# Patient Record
Sex: Male | Born: 1960 | Race: White | Hispanic: No | Marital: Married | Smoking: Former smoker
Health system: Southern US, Community
[De-identification: ages and names within clinical notes are randomized; demographics above are authoritative.]

## PROBLEM LIST (undated history)

## (undated) DIAGNOSIS — N289 Disorder of kidney and ureter, unspecified: Secondary | ICD-10-CM

## (undated) DIAGNOSIS — I4891 Unspecified atrial fibrillation: Secondary | ICD-10-CM

## (undated) DIAGNOSIS — L511 Stevens-Johnson syndrome: Secondary | ICD-10-CM

## (undated) DIAGNOSIS — F419 Anxiety disorder, unspecified: Secondary | ICD-10-CM

## (undated) DIAGNOSIS — I499 Cardiac arrhythmia, unspecified: Secondary | ICD-10-CM

## (undated) HISTORY — PX: OTHER SURGICAL HISTORY: SHX169

---

## 2001-09-11 ENCOUNTER — Encounter: Payer: Self-pay | Admitting: Emergency Medicine

## 2001-09-11 ENCOUNTER — Emergency Department (HOSPITAL_COMMUNITY): Admission: EM | Admit: 2001-09-11 | Discharge: 2001-09-11 | Payer: Self-pay | Admitting: Emergency Medicine

## 2002-06-21 ENCOUNTER — Encounter: Payer: Self-pay | Admitting: Emergency Medicine

## 2002-06-22 ENCOUNTER — Observation Stay (HOSPITAL_COMMUNITY): Admission: EM | Admit: 2002-06-22 | Discharge: 2002-06-22 | Payer: Self-pay | Admitting: Emergency Medicine

## 2002-06-24 ENCOUNTER — Ambulatory Visit (HOSPITAL_BASED_OUTPATIENT_CLINIC_OR_DEPARTMENT_OTHER): Admission: RE | Admit: 2002-06-24 | Discharge: 2002-06-24 | Payer: Self-pay | Admitting: Urology

## 2003-08-21 DIAGNOSIS — L511 Stevens-Johnson syndrome: Secondary | ICD-10-CM

## 2003-08-21 HISTORY — DX: Stevens-Johnson syndrome: L51.1

## 2004-01-02 ENCOUNTER — Emergency Department (HOSPITAL_COMMUNITY): Admission: EM | Admit: 2004-01-02 | Discharge: 2004-01-02 | Payer: Self-pay | Admitting: Emergency Medicine

## 2004-01-06 ENCOUNTER — Emergency Department (HOSPITAL_COMMUNITY): Admission: EM | Admit: 2004-01-06 | Discharge: 2004-01-06 | Payer: Self-pay | Admitting: Emergency Medicine

## 2004-01-09 ENCOUNTER — Emergency Department (HOSPITAL_COMMUNITY): Admission: EM | Admit: 2004-01-09 | Discharge: 2004-01-09 | Payer: Self-pay | Admitting: Emergency Medicine

## 2004-01-15 ENCOUNTER — Emergency Department (HOSPITAL_COMMUNITY): Admission: EM | Admit: 2004-01-15 | Discharge: 2004-01-15 | Payer: Self-pay

## 2004-01-22 ENCOUNTER — Inpatient Hospital Stay (HOSPITAL_COMMUNITY): Admission: EM | Admit: 2004-01-22 | Discharge: 2004-01-28 | Payer: Self-pay | Admitting: Emergency Medicine

## 2004-02-11 ENCOUNTER — Ambulatory Visit (HOSPITAL_COMMUNITY): Admission: RE | Admit: 2004-02-11 | Discharge: 2004-02-11 | Payer: Self-pay | Admitting: Dermatology

## 2004-03-24 ENCOUNTER — Encounter (INDEPENDENT_AMBULATORY_CARE_PROVIDER_SITE_OTHER): Payer: Self-pay | Admitting: Specialist

## 2004-03-24 ENCOUNTER — Ambulatory Visit (HOSPITAL_COMMUNITY): Admission: RE | Admit: 2004-03-24 | Discharge: 2004-03-24 | Payer: Self-pay | Admitting: Urology

## 2004-03-24 ENCOUNTER — Ambulatory Visit (HOSPITAL_BASED_OUTPATIENT_CLINIC_OR_DEPARTMENT_OTHER): Admission: RE | Admit: 2004-03-24 | Discharge: 2004-03-24 | Payer: Self-pay | Admitting: Urology

## 2004-04-12 ENCOUNTER — Observation Stay (HOSPITAL_COMMUNITY): Admission: EM | Admit: 2004-04-12 | Discharge: 2004-04-13 | Payer: Self-pay | Admitting: Emergency Medicine

## 2004-04-17 ENCOUNTER — Emergency Department (HOSPITAL_COMMUNITY): Admission: EM | Admit: 2004-04-17 | Discharge: 2004-04-17 | Payer: Self-pay | Admitting: Emergency Medicine

## 2004-06-17 ENCOUNTER — Emergency Department (HOSPITAL_COMMUNITY): Admission: EM | Admit: 2004-06-17 | Discharge: 2004-06-18 | Payer: Self-pay | Admitting: Emergency Medicine

## 2004-06-20 ENCOUNTER — Emergency Department (HOSPITAL_COMMUNITY): Admission: EM | Admit: 2004-06-20 | Discharge: 2004-06-20 | Payer: Self-pay | Admitting: Emergency Medicine

## 2004-07-03 ENCOUNTER — Emergency Department (HOSPITAL_COMMUNITY): Admission: EM | Admit: 2004-07-03 | Discharge: 2004-07-03 | Payer: Self-pay | Admitting: Emergency Medicine

## 2004-08-22 ENCOUNTER — Encounter: Admission: RE | Admit: 2004-08-22 | Discharge: 2004-08-22 | Payer: Self-pay | Admitting: Internal Medicine

## 2004-10-25 ENCOUNTER — Ambulatory Visit: Payer: Self-pay | Admitting: Psychiatry

## 2004-10-25 ENCOUNTER — Inpatient Hospital Stay (HOSPITAL_COMMUNITY): Admission: RE | Admit: 2004-10-25 | Discharge: 2004-11-06 | Payer: Self-pay | Admitting: Psychiatry

## 2004-10-25 ENCOUNTER — Emergency Department (HOSPITAL_COMMUNITY): Admission: EM | Admit: 2004-10-25 | Discharge: 2004-10-25 | Payer: Self-pay | Admitting: Emergency Medicine

## 2004-11-05 ENCOUNTER — Emergency Department (HOSPITAL_COMMUNITY): Admission: EM | Admit: 2004-11-05 | Discharge: 2004-11-05 | Payer: Self-pay | Admitting: Emergency Medicine

## 2004-11-07 ENCOUNTER — Encounter: Admission: RE | Admit: 2004-11-07 | Discharge: 2004-11-07 | Payer: Self-pay | Admitting: Internal Medicine

## 2004-11-09 ENCOUNTER — Ambulatory Visit (HOSPITAL_COMMUNITY): Admission: RE | Admit: 2004-11-09 | Discharge: 2004-11-09 | Payer: Self-pay | Admitting: Internal Medicine

## 2004-11-14 ENCOUNTER — Inpatient Hospital Stay (HOSPITAL_COMMUNITY): Admission: RE | Admit: 2004-11-14 | Discharge: 2004-11-17 | Payer: Self-pay | Admitting: Psychiatry

## 2005-01-19 ENCOUNTER — Ambulatory Visit (HOSPITAL_COMMUNITY): Admission: RE | Admit: 2005-01-19 | Discharge: 2005-01-19 | Payer: Self-pay | Admitting: Orthopedic Surgery

## 2005-01-19 ENCOUNTER — Ambulatory Visit (HOSPITAL_BASED_OUTPATIENT_CLINIC_OR_DEPARTMENT_OTHER): Admission: RE | Admit: 2005-01-19 | Discharge: 2005-01-19 | Payer: Self-pay | Admitting: Orthopedic Surgery

## 2005-01-25 ENCOUNTER — Ambulatory Visit (HOSPITAL_COMMUNITY): Admission: RE | Admit: 2005-01-25 | Discharge: 2005-01-25 | Payer: Self-pay | Admitting: Orthopedic Surgery

## 2005-07-23 IMAGING — CT CT HEAD W/O CM
1 series · 16 of 28 positions shown, 20 images · non-contrast
Comparison: None.

CLINICAL DATA: Stevens-Johnson syndrome.  Rash.  Blurred vision in the right eye.  

 HEAD CT WITHOUT CONTRAST, 01/25/04

[Series 2: head routi 5.0 h30s · axial · 0.43mm/px · z∈[+1044,+1169]mm · 16 of 28 slices shown, 20 images]
[im 2/28  brain]
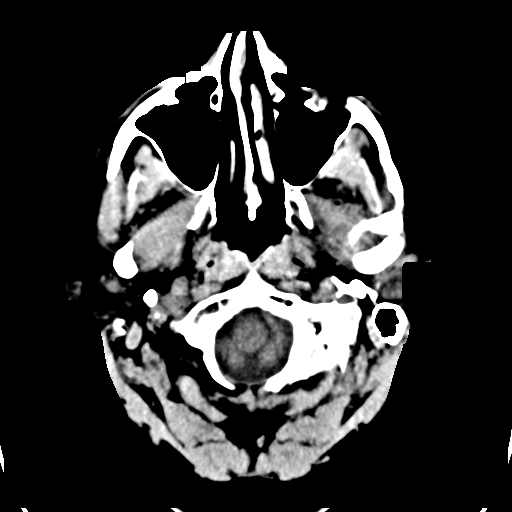
[im 2/28  bone]
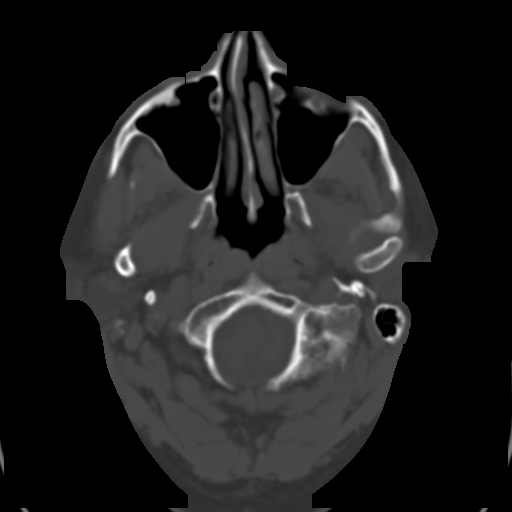
[im 4/28  brain]
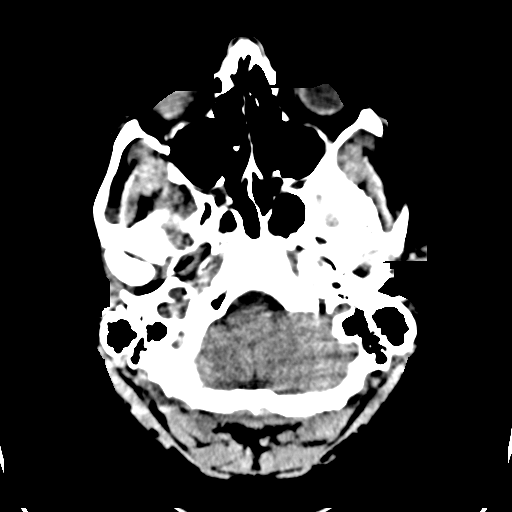
[im 6/28  brain]
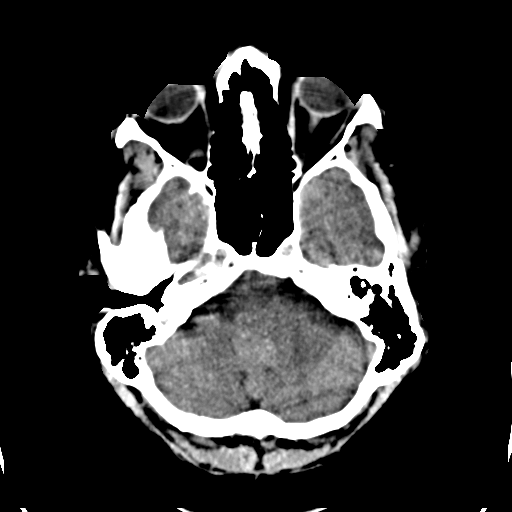
[im 7/28  brain]
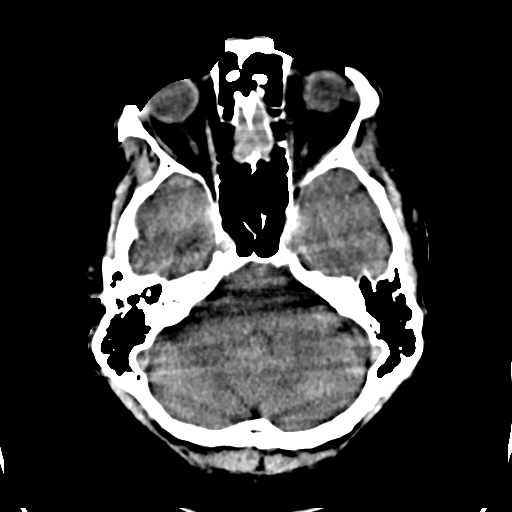
[im 9/28  brain]
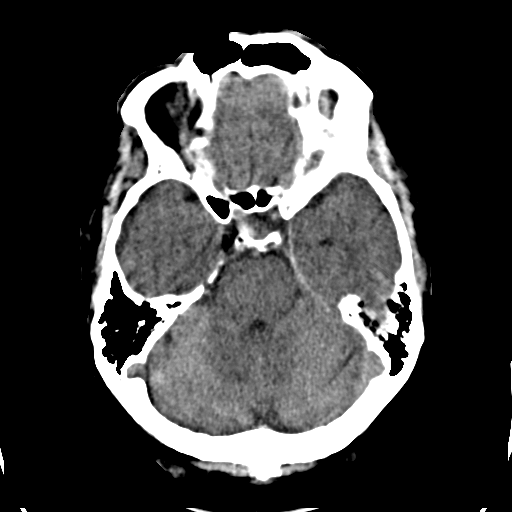
[im 9/28  bone]
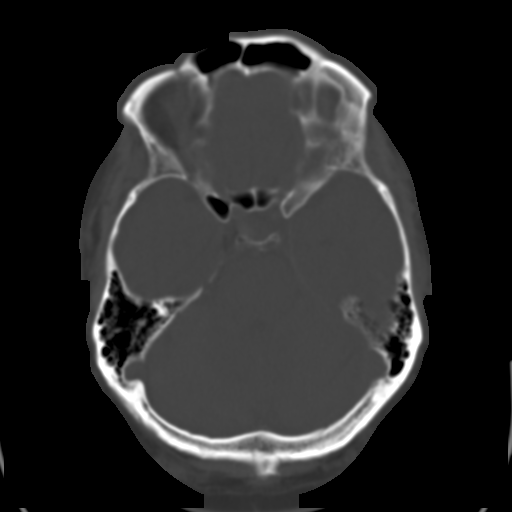
[im 10/28  brain]
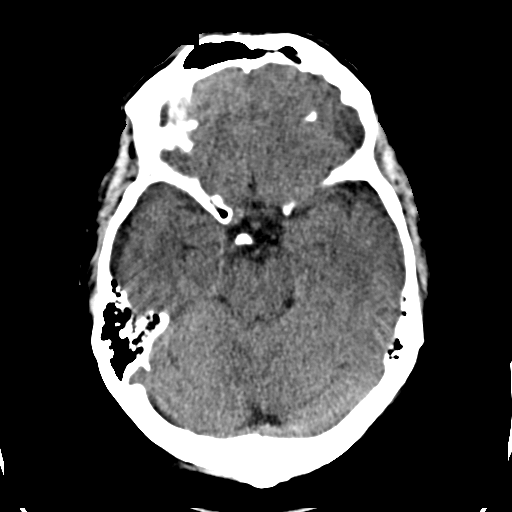
[im 12/28  brain]
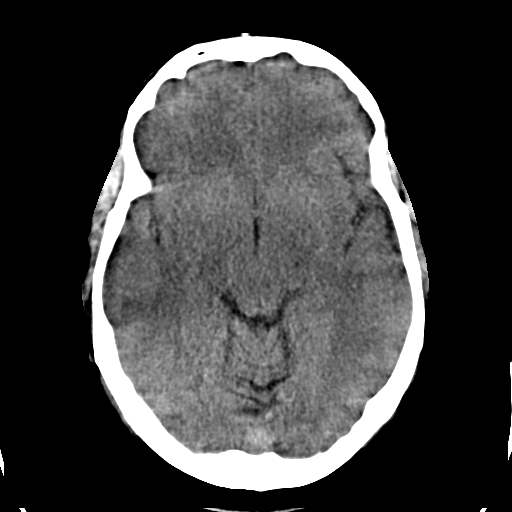
[im 14/28  brain]
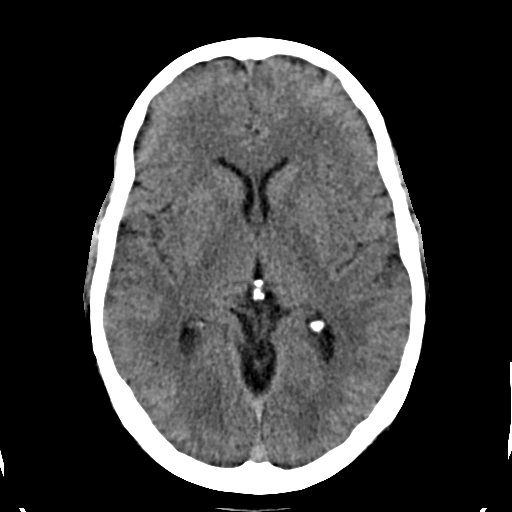
[im 15/28  brain]
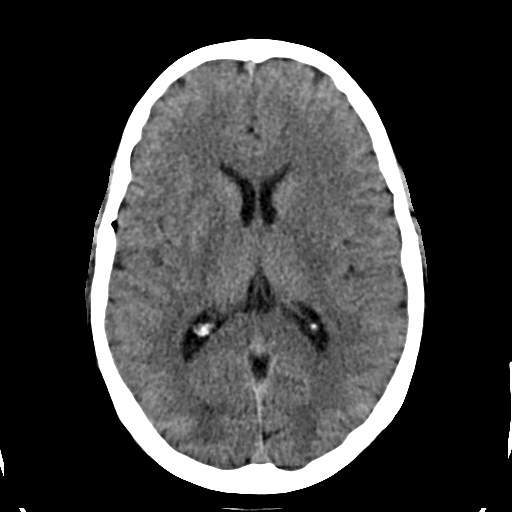
[im 15/28  bone]
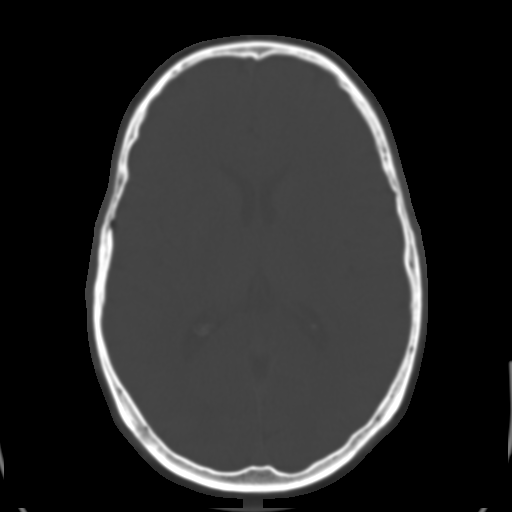
[im 17/28  brain]
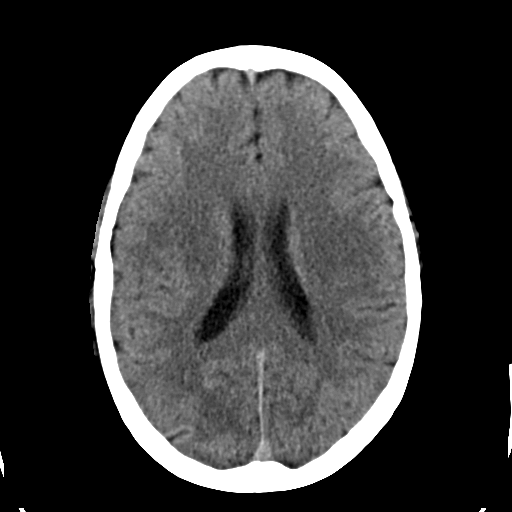
[im 19/28  brain]
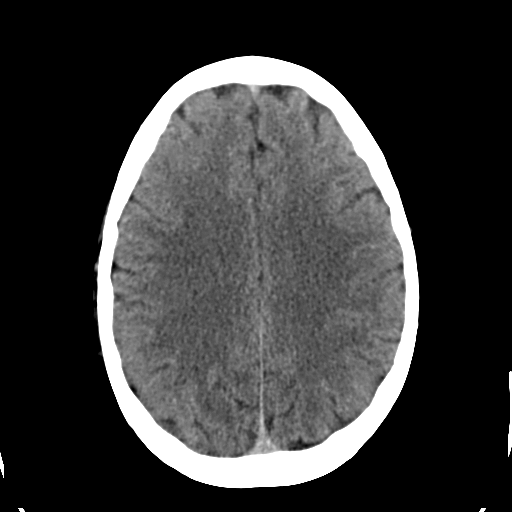
[im 20/28  brain]
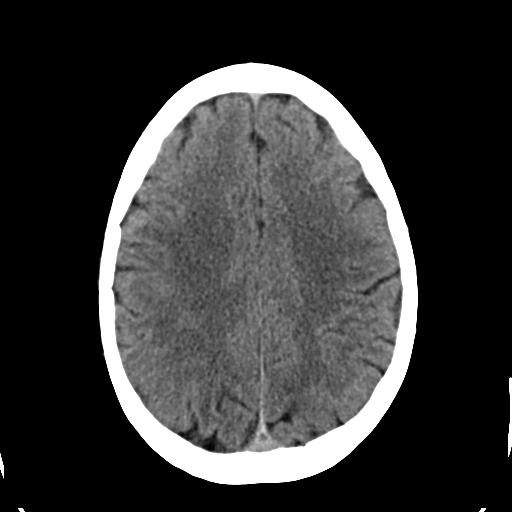
[im 22/28  brain]
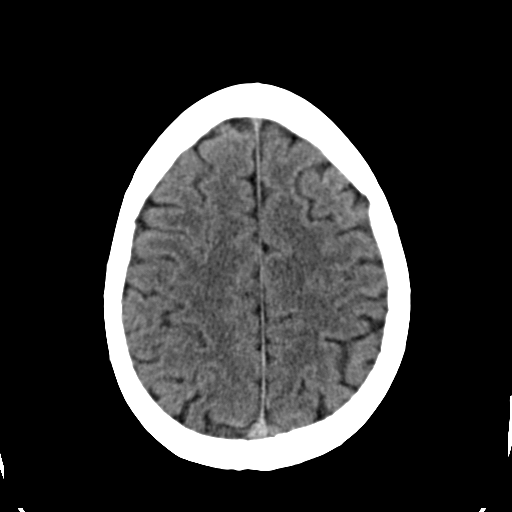
[im 22/28  bone]
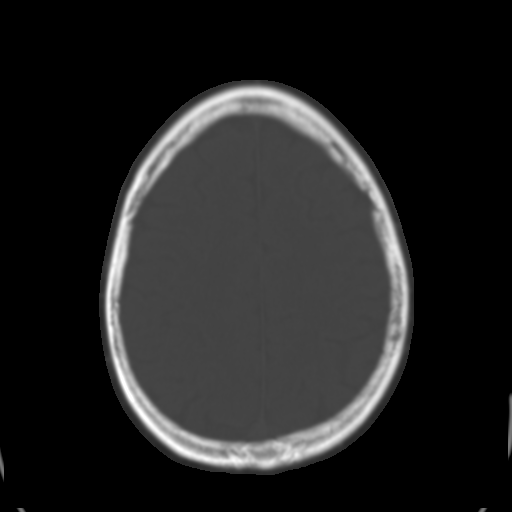
[im 23/28  brain]
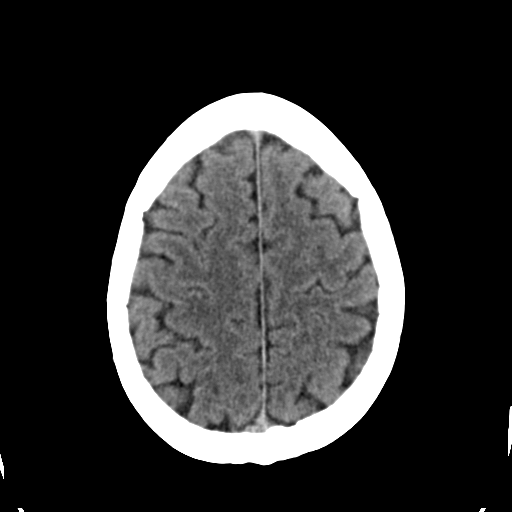
[im 25/28  brain]
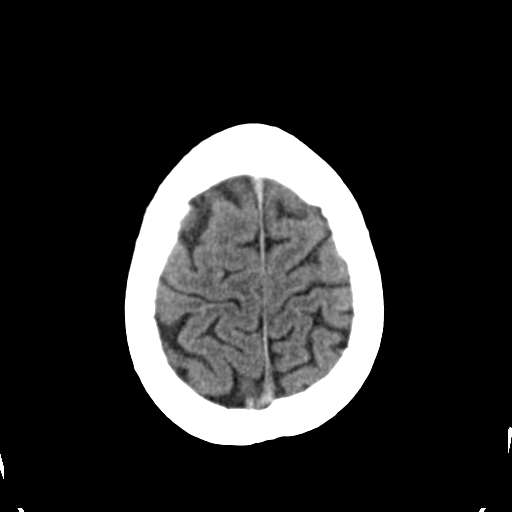
[im 27/28  brain]
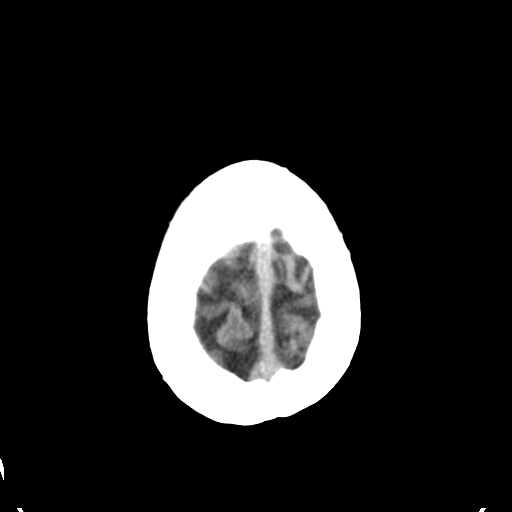

[16 of 28 positions shown; findings below may reference images not displayed]

FINDINGS: There is no evidence for acute hemorrhage, hydrocephalus, mass effect, or abnormal extra-axial fluid collection.  No CT evidence for acute ischemia is identified.  Globes are spherical and symmetric.  The intraorbital fat is preserved bilaterally.  

 Bone windows are unremarkable.  Visualized portions of the paranasal sinuses are clear. 

 IMPRESSION
 No acute intracranial abnormality. 

 [REDACTED]

## 2005-12-19 ENCOUNTER — Ambulatory Visit: Payer: Self-pay | Admitting: Internal Medicine

## 2005-12-20 ENCOUNTER — Ambulatory Visit: Payer: Self-pay | Admitting: Internal Medicine

## 2005-12-30 IMAGING — CT CT ABDOMEN W/O CM
1 series · 15 of 32 positions shown, 19 images · non-contrast
Comparison: 06/18/04 and 03/27/04.

CLINICAL DATA: Abdominal and pelvic pain, right flank pain.  History of   renal calculi.
TECHNIQUE: Multidetector helical CT scanning obtained from the upper abdomen through the remainder of the abdomen and pelvis.

[Series 3: — · axial · 0.78mm/px · z∈[-590,-234]mm · 15 of 100 slices shown, 19 images]
[im 7/100  soft-tissue]
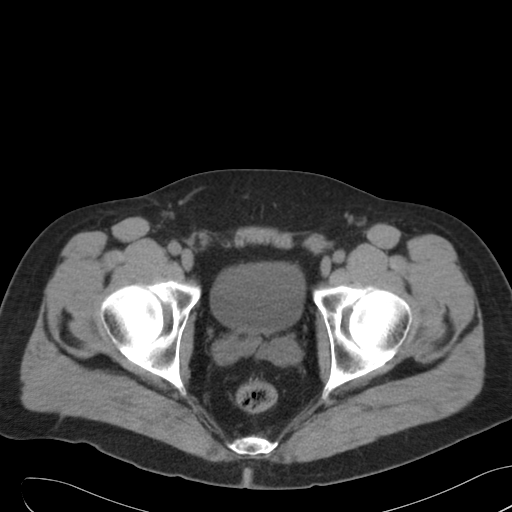
[im 7/100  bone]
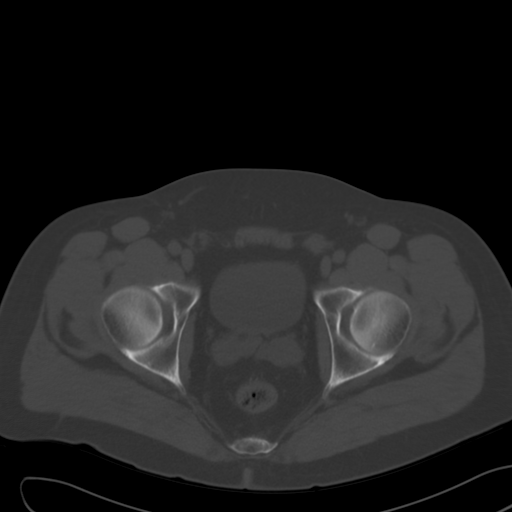
[im 13/100  soft-tissue]
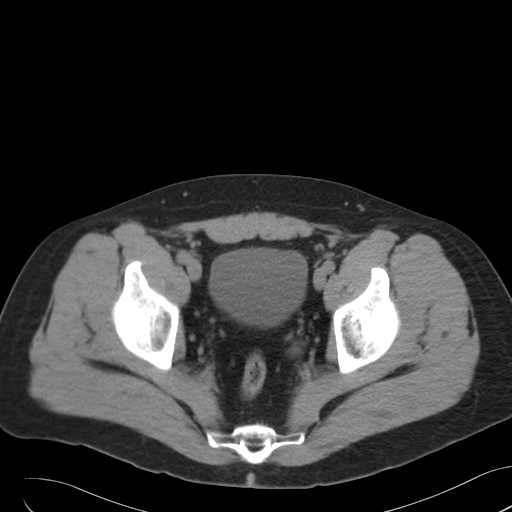
[im 20/100  soft-tissue]
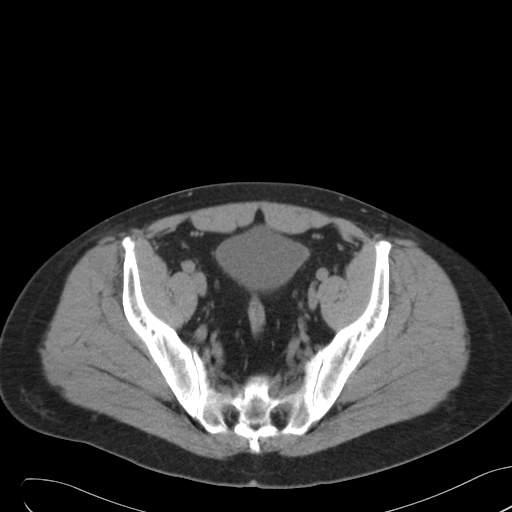
[im 29/100  soft-tissue]
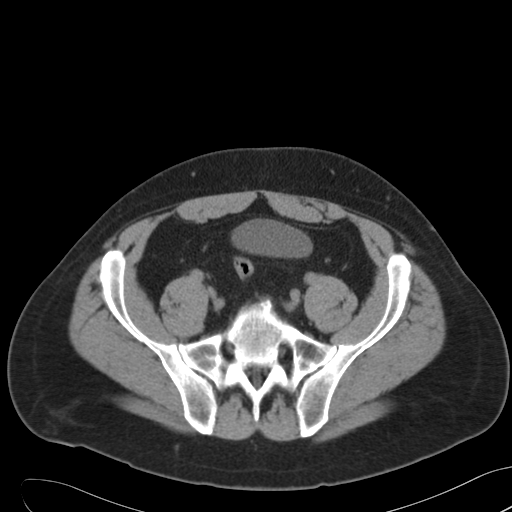
[im 36/100  soft-tissue]
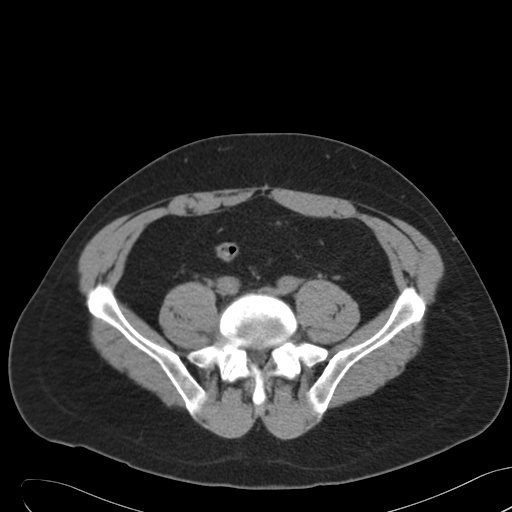
[im 42/100  soft-tissue]
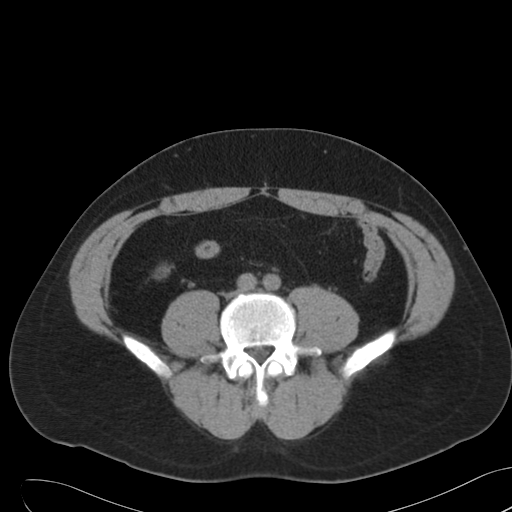
[im 52/100  soft-tissue]
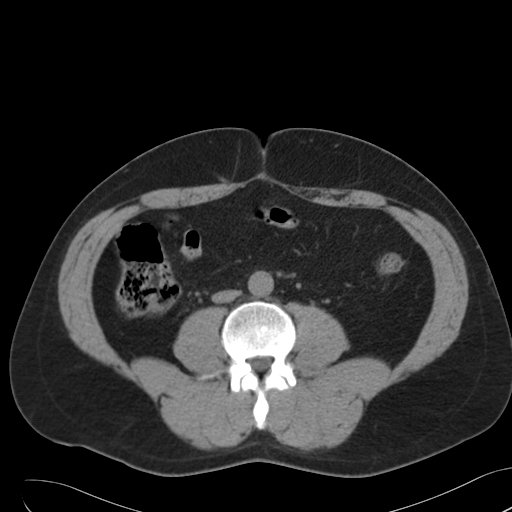
[im 58/100  soft-tissue]
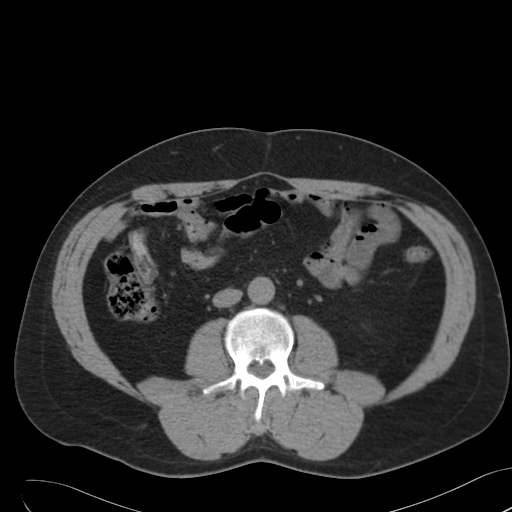
[im 64/100  soft-tissue]
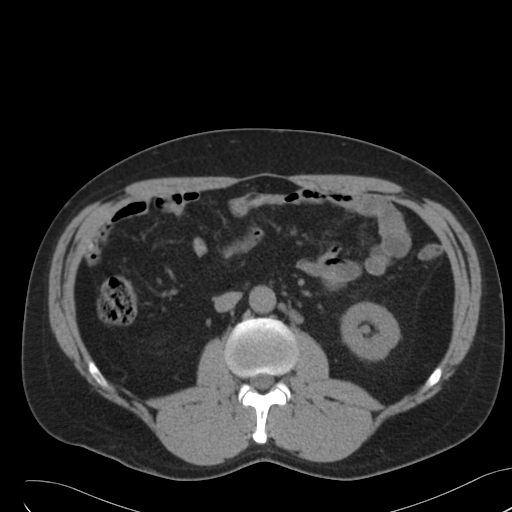
[im 64/100  bone]
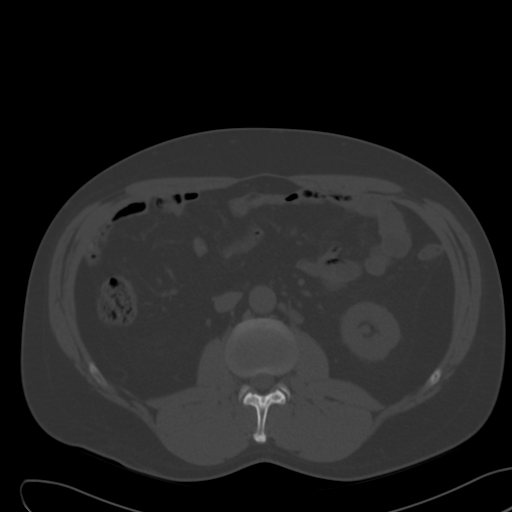
[im 71/100  soft-tissue]
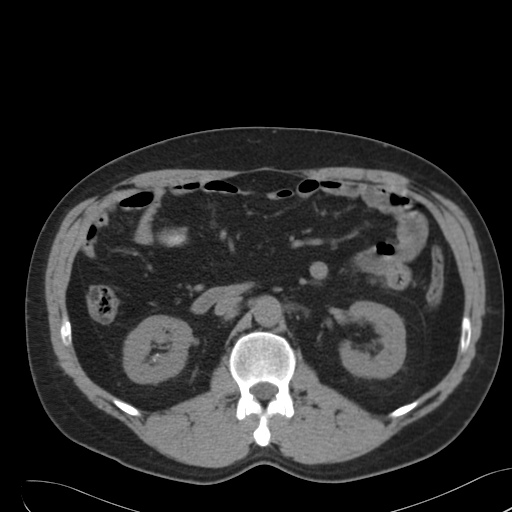
[im 80/100  soft-tissue]
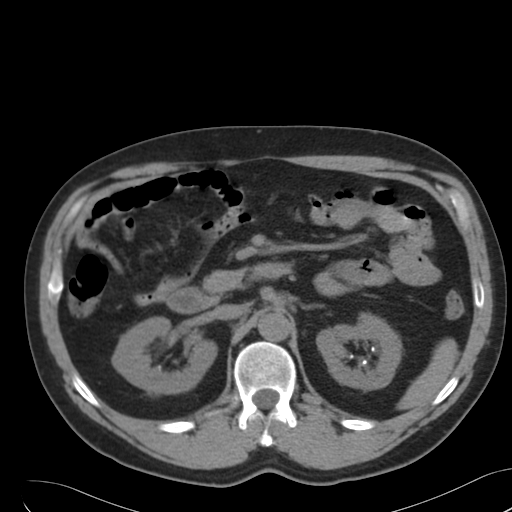
[im 87/100  soft-tissue]
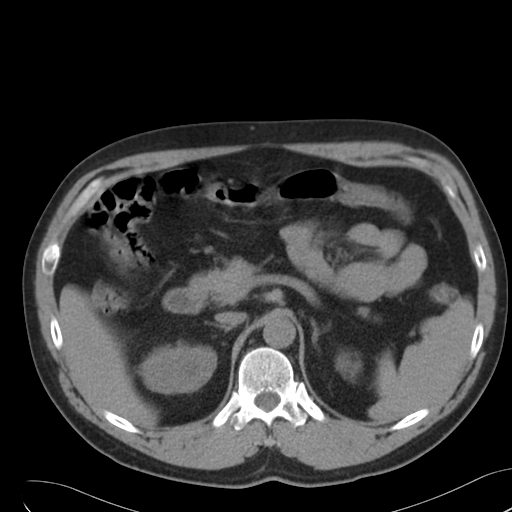
[im 87/100  lung]
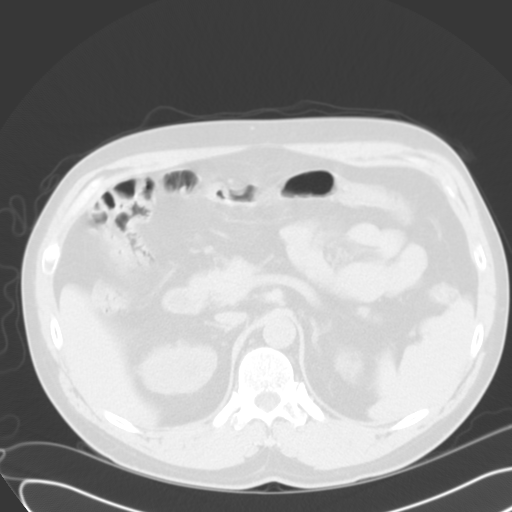
[im 90/100  lung]
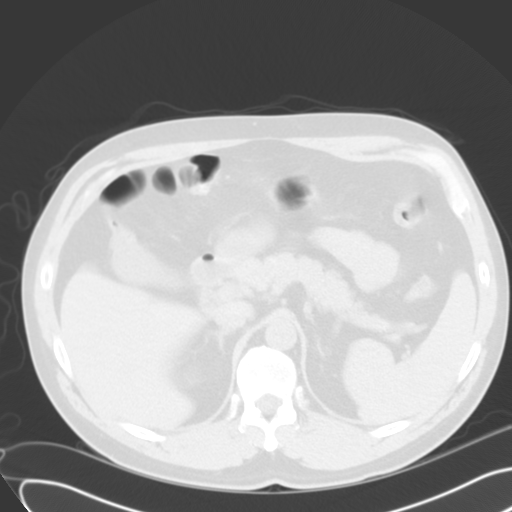
[im 93/100  soft-tissue]
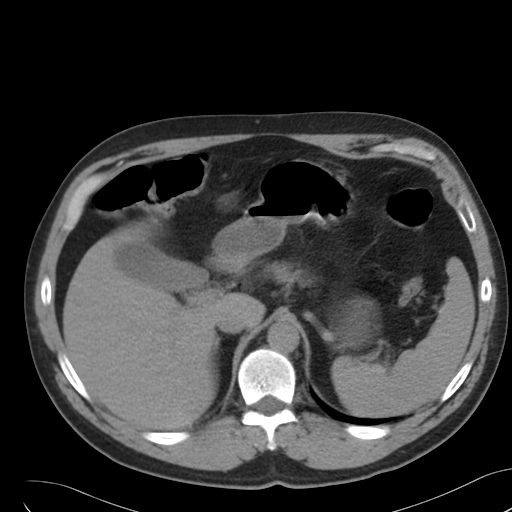
[im 93/100  lung]
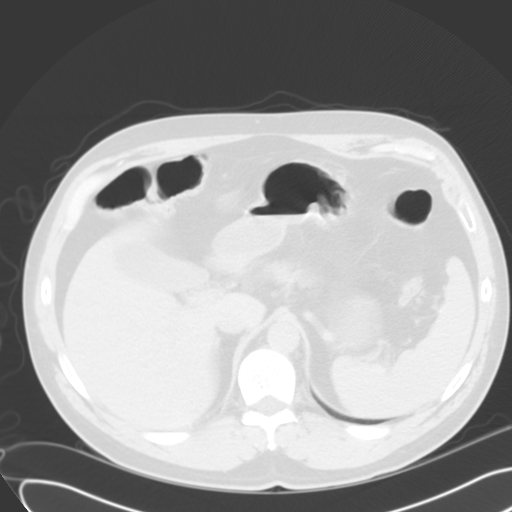
[im 96/100  lung]
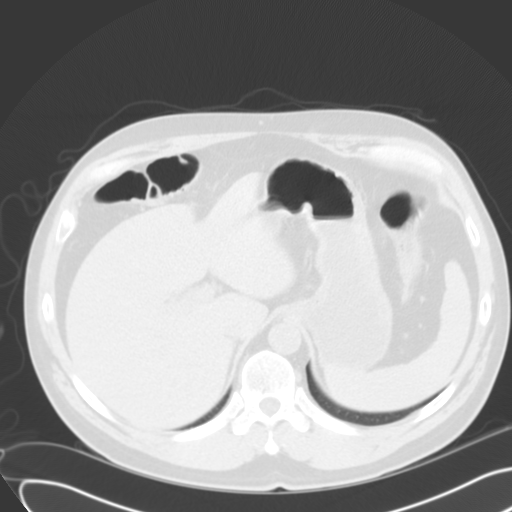

[15 of 32 positions shown; findings below may reference images not displayed]

CT ABDOMEN WITHOUT CONTRAST:
 The visualized liver and spleen are unremarkable.  Multiple punctate bilateral intrarenal calculi are identified.  There is no evidence of hydronephrosis, definite renal masses, or obstructing urinary calculi.  Minimal perinephric stranding is stable.  The adrenal glands, pancreas, gallbladder, are unremarkable.  Please note that parenchymal abnormalities may be missed as intravenous contrast was not administered.  The visualized bowel is unremarkable.  No evidence of enlarged lymph nodes, free fluid, or abdominal aortic aneurysm.
IMPRESSION: 1.  No acute abnormality.
 2.  Punctate non-obstructing bilateral intrarenal calculi.  
 CT PELVIS WITH CONTRAST:
 Two punctate calcifications in the prostate gland are stable.  No evidence of ureteral or bladder calculi.  No evidence of enlarged lymph nodes or free fluid.  The visualized bowel is unremarkable.
IMPRESSION: No acute abnormality.

## 2006-01-22 ENCOUNTER — Emergency Department (HOSPITAL_COMMUNITY): Admission: EM | Admit: 2006-01-22 | Discharge: 2006-01-22 | Payer: Self-pay | Admitting: *Deleted

## 2006-05-17 ENCOUNTER — Emergency Department (HOSPITAL_COMMUNITY): Admission: EM | Admit: 2006-05-17 | Discharge: 2006-05-17 | Payer: Self-pay | Admitting: Emergency Medicine

## 2006-09-15 ENCOUNTER — Emergency Department (HOSPITAL_COMMUNITY): Admission: EM | Admit: 2006-09-15 | Discharge: 2006-09-15 | Payer: Self-pay | Admitting: Emergency Medicine

## 2006-09-30 ENCOUNTER — Emergency Department (HOSPITAL_COMMUNITY): Admission: EM | Admit: 2006-09-30 | Discharge: 2006-09-30 | Payer: Self-pay | Admitting: Emergency Medicine

## 2006-10-30 ENCOUNTER — Emergency Department (HOSPITAL_COMMUNITY): Admission: EM | Admit: 2006-10-30 | Discharge: 2006-10-31 | Payer: Self-pay | Admitting: Emergency Medicine

## 2006-10-30 ENCOUNTER — Ambulatory Visit: Payer: Self-pay | Admitting: Psychiatry

## 2006-10-31 ENCOUNTER — Inpatient Hospital Stay (HOSPITAL_COMMUNITY): Admission: EM | Admit: 2006-10-31 | Discharge: 2006-11-02 | Payer: Self-pay | Admitting: Psychiatry

## 2007-06-30 ENCOUNTER — Emergency Department (HOSPITAL_COMMUNITY): Admission: EM | Admit: 2007-06-30 | Discharge: 2007-06-30 | Payer: Self-pay | Admitting: Emergency Medicine

## 2007-08-22 ENCOUNTER — Emergency Department (HOSPITAL_COMMUNITY): Admission: EM | Admit: 2007-08-22 | Discharge: 2007-08-22 | Payer: Self-pay | Admitting: Emergency Medicine

## 2007-11-16 ENCOUNTER — Emergency Department (HOSPITAL_COMMUNITY): Admission: EM | Admit: 2007-11-16 | Discharge: 2007-11-16 | Payer: Self-pay | Admitting: Emergency Medicine

## 2011-01-05 NOTE — Op Note (Signed)
NAME:  Anthony Roach, Anthony Roach                     ACCOUNT NO.:  [id number]   MEDICAL RECORD NO.:  [id number]                   PATIENT TYPE:  AMB   LOCATION:  [facility]                                 FACILITY:  [facility]   PHYSICIAN:  Mark C. Vernie Ammons, M.D.               DATE OF BIRTH:  04-03-1961   DATE OF PROCEDURE:  06/24/2002  DATE OF DISCHARGE:                                 OPERATIVE REPORT   PREOPERATIVE DIAGNOSES:  Right ureteral calculus.   POSTOPERATIVE DIAGNOSES:  Right ureteral calculus.   PROCEDURE:  Cystoscopy, right retrograde pyelogram with interpretation,  right ureteroscopy with stone extraction.   SURGEON:  Mark C. Vernie Ammons, M.D.   ANESTHESIA:  General.   DRAINS:  None.   SPECIMENS:  Stone given to patient.   ESTIMATED BLOOD LOSS:  Less than 1 cc.   COMPLICATIONS:  None.   INDICATIONS FOR PROCEDURE:  The patient is a 50 year old white male who  developed acute renal colic three days ago. He was admitted to the hospital  for 24 hour observation and pain control. After his pain was controlled, he  declined therapy at that time, wanted to try to pass the stone but continued  to have severe intermittent pain, presented in the office yesterday with  pain and the stone was noted to have not progressed significantly on KUB. He  was therefore brought to the OR today for ureteroscopic extraction of his  stone.   DESCRIPTION OF PROCEDURE:  After informed consent, the patient was brought  to the major OR, placed on the table, administered general anesthesia and  then moved to the dorsal lithotomy position after which his genitalia was  sterilely prepped and draped. The 6.5 rigid ureteroscope was then inserted  in the urethra, passed into the bladder, the bladder was noted to be free of  any tumor, stones, or inflammatory lesions. The right ureteral orifice was  then identified and the scope passed into the orifice without difficulty.  The stone was identified and grasped  with a nitinol basket and extracted.   I then reinserted the ureteroscope, placed it at the ureteral orifice and  performed a retrograde pyelogram to assure no further stones or obstruction  were identified. The system was entirely normal without filling defect or  other abnormality and I noted fluoroscopically contrast passing down the  ureter unobstructed and on out through the ureteral orifice.   I then removed the ureteroscope and inserted the cystoscope with a 12 degree  lens, again the bladder was fully inspected and noted to be free of any  abnormality. The bladder was then drained and the patient was awakened and  taken to the recovery room in stable satisfactory condition. He tolerated  the procedure well. There were no intraoperative complications and he  received Toradol IV intraoperatively. He will be given a prescription for  Pyridium 200 mg postoperatively and will follow-up in my office in about a  week. He has pain medication at home.                                               Mark C. Vernie Ammons, M.D.    MCO/MEDQ  D:  06/24/2002  T:  06/24/2002  Job:  [id number]

## 2011-01-05 NOTE — Discharge Summary (Signed)
NAME:  GAETAN, SPIEKER NO.:  [id number]   MEDICAL RECORD NO.:  [id number]          PATIENT TYPE:  IPS   LOCATION:  0306                          [facility]ACILITY:  [facility]   PHYSICIAN:  Jeanice Lim, M.D. DATE OF BIRTH:  04/11/1961   DATE OF ADMISSION:  11/14/2004  DATE OF DISCHARGE:  11/17/2004                                 DISCHARGE SUMMARY   IDENTIFYING INFORMATION:  This is a 50 year old married white male who was  voluntarily admitted, presenting with positive auditory hallucinations,  hearing his name called.  Also experiencing positive visual hallucinations,  seeing a person that he feels is behind him and he is too terrified to turn  and look at him.  The patient realizes that this is not true but having a  lot of anxiety.  Has a history of OCD.  Has lost about 16 pounds and having  some passive suicidal thoughts.   PAST PSYCHIATRIC HISTORY:  This is the patient's second visit.  The patient  was recently here and discharged about two weeks ago.  Sees Dr. Donell Beers as  an outpatient.   MEDICAL HISTORY:  The patient has a history of kidney stones and a history  of Stevens-Johnson syndrome related to medications.   CURRENT MEDICATIONS:  Prozac 60 mg daily, trazodone 100 mg, taking 2 at  bedtime.  Was on Cogentin but medication was stopped due to swelling.   ALLERGIES:  The patient reports allergy to BENZTROPINE, reporting swelling.   PHYSICAL EXAMINATION:  This is an overweight middle-age male in no acute  distress.  He does have some edema noted bilaterally in his lower  extremities.  Also has multiple tattoos.  His neurological findings are  intact.   LABORATORY DATA:  His CBC was within normal limits.  CMET within normal  limits.   ADMISSION DIAGNOSES:   AXIS I:  1.  Major depressive disorder with psychosis.  2.  Obsessive-compulsive disorder.   AXIS II:  Deferred.   AXIS III:  History of kidney stones.   AXIS IV:  Problems with occupation, other  psychosocial problems.   AXIS V:  Current 25; estimated this patient year 60-65.   HOSPITAL COURSE:  The patient was resumed on his antidepressant and sleep  aid.  Risperdal was added for psychotic symptoms and to help out with his  OCD, behaviors and thinking.  The patient was attending individual and group  therapy, was responding to antipsychotic.  His anxiety and hallucinations  had decreased.  On day of discharge, the patient denied any auditory  hallucinations, was less anxious, denied any suicidal thoughts.  He was  pleasant and appropriate.  There was no evidence of self-harm verbalized,  feeling he was ready for discharge.  Medication education and compliance  were done with the patient prior to discharge.   DISCHARGE MEDICATIONS:  1.  Prozac 40 mg, 1 in the morning, brand name only.  2.  Trazodone 100 mg, taking 2 at 8 p.m.  3.  Ambien CR 12.5 mg, 1 at bedtime.  4.  Flonase 50 mcg, 2 sprays daily.  5.  Risperdal 1 mg, 1/2  in the morning, 1/2 at 2 p.m. and 2 at 8 p.m.   FOLLOW UP:  The patient was instructed to not use opiate or pain  medications, no alcohol and no benzodiazepines.  The patient was to follow  up at [facility].  Phone number and address provided on November 20, 2004  at 3 p.m.   DISCHARGE DIAGNOSES:   AXIS I:  1.  Major depressive disorder with psychotic symptoms.  2.  Obsessive-compulsive disorder.   AXIS II:  Deferred.   AXIS III:  Kidney stones.   AXIS IV:  Problems with occupation, other psychosocial problems.   AXIS V:  Current 45; estimated this patient year 60-65.      JO/MEDQ  D:  12/28/2004  T:  12/28/2004  Job:  [id number]

## 2011-01-05 NOTE — Op Note (Signed)
NAME:  Anthony Roach, Anthony Roach           ACCOUNT NO.:  [id number]   MEDICAL RECORD NO.:  [id number]          PATIENT TYPE:  AMB   LOCATION:  [facility]SC                          FACILITY:  [facility]   PHYSICIAN:  Harvie Junior, M.D.   DATE OF BIRTH:  [birth year]   DATE OF PROCEDURE:  01/19/2005  DATE OF DISCHARGE:                                 OPERATIVE REPORT   PREOPERATIVE DIAGNOSIS:  Impingement acromioclavicular joint inferior  spurring, probable rotator cuff tear and biceps tendinopathy.   POSTOPERATIVE DIAGNOSIS:  Impingement acromioclavicular joint inferior  spurring, probable rotator cuff tear and biceps tendinopathy.   PROCEDURE:  1.  Mini-open rotator cuff repair with corresponding acromioplasty.  2.  Distal clavicle resection through an anterior portal by way of      coplaning.  3.  Debridement of biceps tendon open.  4.  Greater tuberosity tuberoplasty.   SURGEON:  Harvie Junior, M.D.   ASSISTANT:  Marshia Ly, P.A.   ANESTHESIA:  General.   BRIEF HISTORY:  50 year old male with a long history of having left shoulder  pain.  He was ultimately evaluated in the office multiple times and felt to  have biceps tendinitis as well as partial thickness rotator cuff tear.  In  fact, my readings of the MRI show that he may have had a full thickness  rotator cuff tear.  We treated him conservatively for a prolonged period of  time and with no good results.  Injection therapy had helped very well.  Because of continued complaints of pain and having heard another loud pop,  we ultimately felt that taking him to the operating room for arthroscopic  evaluation was the most appropriate course of action and he is brought to  the operating room for this procedure.   DESCRIPTION OF PROCEDURE:  The patient was taken to the operating room and  after adequate anesthesia was obtained with general anesthetic, the patient  was placed on the operating table.  The left arm was prepped and draped in  the usual sterile fashion.  Following this, routine arthroscopic examination  of the shoulder revealed there was obvious significant fraying of the biceps  tendon which was debrided from within the glenohumeral joint.  The biceps  tendon was seen to be 60-70% intact and there was a rotator cuff tear at the  anterior edge of the supraspinatus, high grade partial thickness.  At that  point, felt that it was going to need to be addressed open and at this point  we went out of the glenohumeral joint, the ball and socket joint of the  glenohumeral joint looked good.  After this, we went into the subacromial  space and did an anterolateral acromioplasty.  He did have a significant  spur on the inferior aspect of the distal clavicle and this was debrided  by  way of coplaning.  Following this, attention was turned towards the rotator  cuff superior surface where a probe could easily be passed through the  anterior edge of the supraspinatus.  At this point, I felt that mini-open  rotator cuff repair was appropriate and the arthroscopic  portion of the case  was stopped.  A small lateral incision was made, deltoid was split, the cuff  tear was easily identifiable.  We opened this area up.  I got the biceps  tendon out and did some more open debridement, really a tenotomy of the  biceps tendon, I think 50-60%, probably 70% was still intact.  There was  some fraying but I thought with taking the spur off the inferior portion of  the clavicle and doing the acromioplasty as well as repairing the rotator  cuff here I felt it was appropriate to leave this attached in the joint, at  this point.  Following this, the rotator cuff tear was extended a little  bit, the edges freshened up and actually turned into a little bit bigger  rotator cuff repair than we initially thought and that was from that high  grade partial thickness portion that we could see from the under surface.  There was a fairly significant  prominence of the greater tuberosity and I  think this was also an impinging force and we felt that a tuberoplasty was  appropriate and a greater tuberosity tuberoplasty was performed with a  rongeur.  A fairly significant amount of bone was removed.  The rotator cuff  was then pulled over through two suture anchors and tied in place.  Excellent fixation was achieved.  Range of motion was great.  No tendency  towards impingement.  The wound was copiously irrigated and suctioned dry.  The deltoid was closed with a #1 Vicryl running suture, skin with 0 and 2-0  Vicryl, and 3-0 Maxon pull out sutures.  Benzoin and Steri-Strips were  applied.  The patient was taken to the recovery room where he was noted to  be in satisfactory condition.  Estimated blood loss was minimal.       JLG/MEDQ  D:  01/19/2005  T:  01/19/2005  Job:  [id number]

## 2011-01-05 NOTE — Discharge Summary (Signed)
NAME:  Anthony Roach, Anthony Roach NO.:  [id number]   MEDICAL RECORD NO.:  [id number]          PATIENT TYPE:  IPS   LOCATION:  0500                          [facility]ACILITY:  [facility]   PHYSICIAN:  Geoffery Lyons, M.D.      DATE OF BIRTH:  1961-02-26   DATE OF ADMISSION:  10/31/2006  DATE OF DISCHARGE:  11/02/2006                               DISCHARGE SUMMARY   CHIEF COMPLAINT AND PRESENT ILLNESS:  This is the second admission to  [facility] for this 50 year old married white male  voluntarily admitted.  History of alcohol abuse, opiate abuse as well as  benzodiazepine abuse, depressed with suicidal thoughts.  Had been  abusing Vicodin and Tylox, using 60 tablets in a two-week period.  Taking up to 4-5 at a time.  Relapsed about a month ago.  Had been  abusing his Xanax.  Endorsed he needed help from drinking on the  weekends, experiencing withdrawal.  History of seizure about a month  prior to this admission from benzodiazepine withdrawal.   PAST PSYCHIATRIC HISTORY:  Was previously at [facility] two years prior to this admission.  History of depression.  Sees  Dr. Donell Beers for outpatient treatment.   ALCOHOL/DRUG HISTORY:  Persistent use of alcohol, opiates and  benzodiazepines.   MEDICAL HISTORY:  Seizure in January for benzodiazepine withdrawal after  he was off medication for three days.   MEDICATIONS:  Xanax 1 mg three times a day, Prozac 20 mg per day and  Ambien CR 12.5 mg at bedtime.   PHYSICAL EXAMINATION:  Performed and failed to show any acute findings.   LABORATORY WORK:  Sodium 137, potassium 3.6, glucose 92, BUN 5,  creatinine 0.84, white blood cells 6.9, hemoglobin 15.6.  Drug screen  positive for benzodiazepines.   MENTAL STATUS EXAM:  Upon admission revealed an alert, cooperative male,  fair eye contact.  Casually dressed.  Very soft-spoken.  Mood depressed.  Endorsed having withdrawal symptoms.  Constricted affect.  Thought  processes were logical, coherent and relevant.  No evidence of  delusions.  No active suicidal or homicidal ideation, no hallucinations.  Cognition well-preserved.   ADMISSION DIAGNOSES:  AXIS I:  Opiate, benzodiazepine and alcohol abuse;  rule out dependence.  Depressive disorder not otherwise specified.  AXIS II:  No diagnosis.  AXIS III:  Atrial fibrillation per chart.  AXIS IV:  Moderate.  AXIS V:  GAF upon admission 35; height GAF in the last year 65.   HOSPITAL COURSE:  He was admitted and started in the individual and  group psychotherapy.  He was detoxified with Librium and clonidine.  As  already stated, he was a 50 year old male, history of being dependent on  opiates.  More recently abusing his benzodiazepines.  Admitted to  underlying depression, stable work situation and stable home.  Has seen  increased dependency.  He ran out of the Xanax three days as he could  not refill it.  It was tried and he could not do it.  Tried to wait  until the morning to get a refill.  He had a seizure.  Got scared.  Used  4 Xanax after he got the prescription.  Started getting worse and worse.  Endorsing increased use of opiates.  Wanted to come to be detoxed as he  was trying to affect his functioning.  He endorsed symptoms of social  anxiety on Prozac, which had been the best of all medication he has  tried before.  On November 01, 2006, he endorsed he had a very rough day,  lots of anxiety, agitation.  Requested increased dose of Librium but  later he endorsed that he was starting to respond to the protocol.  Endorsed he was committed to abstinence.  He endorsed some underlying  depression and anxiety.  We continued to work on detox, relapse  prevention, cognitive behavioral ways of dealing with the anxiety, the  mood disorder.  By November 02, 2006, he was in full contact reality.  There were no suicidal or homicidal ideation, no hallucinations, no  delusions.  No active withdrawal.  He was  ready to go home.  Will be  spending time with family.  Endorsed he had a good friend that would  help to keep the drug users away from him.  Was going to pursue  outpatient treatment.  Continue follow-up with Dr. Donell Beers.   DISCHARGE DIAGNOSES:  AXIS I:  Opiate dependence.  Benzodiazepine  dependence.  Depressive disorder not otherwise specified.  AXIS II:  No diagnosis.  AXIS III:  Atrial fibrillation, status post benzodiazepine withdrawal  seizure.  AXIS IV:  Moderate.  AXIS V:  GAF upon discharge 50.   DISCHARGE MEDICATIONS:  1. Librium 25 mg, 1 at 5 p.m. on November 02, 2006; then Librium 25 mg, 1      at 8 a.m. and 1 at 5 p.m. on November 03, 2006; then Librium 25 mg, 1      at 8 a.m. on November 04, 2006; then discontinue.  2. Prozac 20 mg per day, 3 daily.  3. Catapres 0.1 mg, 1 in the morning, 1 at bedtime x2 days; then 1 in      the morning x2 days; then discontinue.  4. Trazodone 50 mg at bedtime.   FOLLOWUP:  Dr. Donell Beers.      Geoffery Lyons, M.D.  Electronically Signed     IL/MEDQ  D:  11/29/2006  T:  11/29/2006  Job:  [id number]

## 2011-01-05 NOTE — H&P (Signed)
NAME:  Anthony Roach, Anthony Roach NO.:  [id number]   MEDICAL RECORD NO.:  [id number]          PATIENT TYPE:  IPS   LOCATION:  0504                          FACILITY:  [facility]   PHYSICIAN:  Geoffery Lyons, M.D.      DATE OF BIRTH:  [birth year]   DATE OF ADMISSION:  10/31/2006  DATE OF DISCHARGE:                       PSYCHIATRIC ADMISSION ASSESSMENT   IDENTIFYING INFORMATION:  This is a 50 year old married white male  voluntarily admitted on October 30, 2006.   PRESENT ILLNESS:  The patient presents with a history of alcohol abuse  and opiate abuse and benzodiazepine abuse.  Depressed with suicidal  thoughts.  The patient reports he has been abusing Vicodin and Tylox,  using 60 tablets in a two-week period, taking up to 4-5 at a time.  Relapsed about a month ago.  He also has been abusing his Xanax when he  has trouble sleeping.  He states that he needs help.  He reports some  drinking on the weekends.  He is experiencing withdrawal symptoms at  this time.  He has a history of a seizure about a month ago from  benzodiazepine withdrawal.   PAST PSYCHIATRIC HISTORY:  The patient was here about two years ago.  He  has a history of depression.  Sees Dr. Donell Beers for outpatient mental  health services.   SOCIAL HISTORY:  This is a 50 year old married white male.  He has two  daughters.  He lives with his wife and children.  He works as a  Medical illustrator.  He has a twelfth grade education.   FAMILY HISTORY:  Denies.   ALCOHOL/DRUG HISTORY:  The patient smokes and his alcohol and drug  habits are described above.   PRIMARY CARE PHYSICIAN:  None.   MEDICAL PROBLEMS:  He reports a seizure in January from benzodiazepine  withdrawal.  Was off his medications for approximately three days.  States it was too early to get a refill.  Also reports a history of  kidney stones and states he has easy access to pain medications.   MEDICATIONS:  Has been on Xanax 1 mg t.i.d., Prozac 20 mg  daily and  Ambien CR 12.5 at bedtime.   ALLERGIES:  No known allergies.   PHYSICAL EXAMINATION:  This is a middle-aged male in no acute distress  although he is complaining of some muscle and joint pain.  He was fully  assessed at [facility].  He did receive IV fluids,  Ativan, Compazine and Benadryl.  Temperature is 97, heart rate 97,  respirations 18, blood pressure 139/82.  He is 174 pounds, approximately  5 feet 9 inches tall.   LABORATORY DATA:  His CBC is within normal limits.  BMET within normal  limits.  Urine drug screen is positive for benzodiazepines.  Alcohol  level was 56.   MENTAL STATUS EXAM:  He is fully alert, cooperative, fair eye contact.  He is casually dressed.  Speech is soft-spoken but clear.  Mood is  depressed with having withdrawal symptoms.  His affect is flat.  Thought  processes are coherent.  No evidence of psychosis.  Cognitive function  intact.  Memory is good.  Judgment is fair.  Insight is fair.  Poor  impulse control.   DIAGNOSES:  AXIS I:  Opiate dependence.  Benzodiazepine dependence.  Alcohol abuse.  Depressive disorder not otherwise specified.  AXIS II:  Deferred.  AXIS III:  Atrial fibrillation per chart.  AXIS IV:  Other psychosocial problems related to chronic substance  abuse.  AXIS V:  Current 40.   PLAN:  Contract for safety.  Stabilize mood and thinking.  Will detox  with the Librium protocol and the clonidine protocol for opiate use.  Will put patient on seizure precautions.  Monitor withdrawal symptoms.  We will also contact Dr. Donell Beers for patient's arrival and any other  further recommendations.  Will have the session with wife.  [facility]  is to obtain follow-up for rehab.   TENTATIVE LENGTH OF STAY:  Four to six days.      Landry Corporal, N.P.      Geoffery Lyons, M.D.  Electronically Signed    JO/MEDQ  D:  10/31/2006  T:  11/01/2006  Job:  [id number]

## 2011-01-05 NOTE — H&P (Signed)
NAME:  Anthony Roach, Anthony Roach                     ACCOUNT NO.:  [id number]   MEDICAL RECORD NO.:  [id number]                   PATIENT TYPE:  INP   LOCATION:  0440                                 FACILITY:  [facility]   PHYSICIAN:  Blanch Media, M.D.             DATE OF BIRTH:  June 08, 1961   DATE OF ADMISSION:  01/22/2004  DATE OF DISCHARGE:                                HISTORY & PHYSICAL   Anthony Roach is a 50 year old white male with no significant past  medical history. He had a rash for about three weeks and was seen twice in  the urgent care center/emergency department and was given a Medrol Dosepak.  He was also at various times prescribed Benadryl and antihistamines, and  Vistaril for itching. His rash did not improve and it got worse, and he  presented to Dr. Michaell Cowing' dermatology office to increase his prednisone to 100  mg a day. He has been on 100 mg on five days and then on June 4th he  decreased his dosage down to 80 mg. On June 4th, in addition to decreasing  his prednisone, he started to work around the house and the rash flared up,  and his pain increased. When he had been on 100 mg he noticed a significant  decrease in the rash with his face completely resolving and his also  resolving. However, on June 4th, the rash increased significantly on his  back, buttocks, legs, abdomen, and more significantly his left arm. Dr.  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   Currently, the patient complains of significant pain most in his left  forearm and also on the left flank, but it is throughout. He has also had  dry heaves that started on the day prior to admission and has actually  vomited a couple of times. He has also noted an increase in his appetite  since he has been on prednisone. He has also noticed lower extremity edema  that also occurred when  he was started on the prednisone.   The patient relates significant allergies in the past and if he would get a  bee sting he would have significant swelling to a bee sting.   PAST MEDICAL HISTORY:  No significant past medical history.   MEDICATIONS:  1. Hydrocodone.  2. Vistaril.  3. Prednisone 80 mg starting on June 4th.   ALLERGIES:  No known drug allergies.   FAMILY HISTORY:  No significant history including no rash history in his  family and no coronary artery disease in his family.   SOCIAL HISTORY:  The patient works as a Nature conservation officer. He quit smoking years ago.  He is an occasional social drinker. No street drugs. The patient is married.  His wife is with him today.   PHYSICAL EXAMINATION:  VITAL SIGNS: Temperature 97.2,  blood pressure 138/84,  pulse 110, respirations 20, O2 saturation 92% on room air.  GENERAL: The patient is in no acute distress, but he has dry heaves during  the examination.  HEENT: Eyes are clear, although there is a little bit of puffiness around  the eyes. Oropharynx is clear. Heart is tachycardic with no murmurs, rubs,  or gallops.  LUNGS: Clear to auscultation bilaterally with good air movement.  ABDOMEN: Obese, positive bowel sounds, soft, nontender, and nondistended.  EXTREMITIES: +1 pitting edema bilaterally pretibial, +2 DP pulses  bilaterally.  SKIN: Significant for rash, but no sloughing of the skin. There are areas of  erythema with some dry scaling areas on his forearms, back, abdomen, chest,  flanks, and lower extremities.   White blood cell count 11.2, hemoglobin 13.3, platelet count 219,000.   ASSESSMENT/PLAN:  1. Anthony Roach syndrome flare. Originally the EDP had spoken to Dr. Michaell Cowing     who advised that the patient be admitted and started on IV IT. However,     Dr. Michaell Cowing is not on call and does not have admitting privileges. We then     later spoke to Dr. Terri Piedra who is a dermatologist on call. Dr. Dorita Sciara     take on this  situation is that the patient had a good response to the 100     mg prednisone, but when he tapered it down to 80  his symptoms flared up.     He reviewed the literature regarding IV IT and was not impressed with the     literature indicating that some patients responded and some did not.     Since we know that Mr. Fredin responded well to the 100 mg of     prednisone we are going to continue to use that rather than use an     unknown. Will admit the patient to a regular bed, increase his prednisone     to 100 mg daily, and use pain control with morphine sulfate IV.  Will     also use antinausea medication starting with Zofran and use GI     prophylaxis since he is on high-dose steroids with Protonix 40 mg daily.     Will use Vistaril as needed for itching.  2. The patient does have symptoms of lower extremity edema and some dyspnea,     however, he has no cardiac history and no family history of cardiac     symptoms and these symptoms can be accounted for by the use of high-dose     prednisone. Therefore, will monitor on Tresa Endo and will not an     echocardiogram as I do not feel that this is cardiac in origin.  3. Prophylaxis. As above will use GI prophylaxis with a  proton pump     inhibitor. Will not use DVT prophylaxis as the patient is not bed     confined and I am hesitant to do a subcutaneous injection when he has     such significant rash.   DISPOSITION:  Once the patient's pain is better controlled he will be ready  for discharge with follow up with Dr. Michaell Cowing.                                               Blanch Media, M.D.    EB/MEDQ  D:  01/23/2004  T:  01/23/2004  Job:  [id number]

## 2011-01-05 NOTE — Discharge Summary (Signed)
NAME:  Anthony Roach, CAUGHLIN NO.:  [id number]   MEDICAL RECORD NO.:  [id number]          PATIENT TYPE:  IPS   LOCATION:  0503                          FACILITY:  [facility]   PHYSICIAN:  Jeanice Lim, M.D. DATE OF BIRTH:  [birth year]   DATE OF ADMISSION:  10/25/2004  DATE OF DISCHARGE:  11/06/2004                                 DISCHARGE SUMMARY   IDENTIFYING DATA:  This is a 50 year old Caucasian male who went to the  emergency room to get help to stop from killing himself.  Followed by Dr.  Donell Beers.  History of OCD.  Reports depression for two weeks with sadness,  crying due to feelings of worthlessness, hopelessness, burden on wife,  insomnia, decreased appetite.  Admits to suicidal ideation as well as  auditory hallucinations.   MEDICATIONS:  Luvox 100 mg, 3 q.h.s., Ambien CR 12.5 mg q.h.s.   ALLERGIES:  No known drug allergies.   PHYSICAL EXAMINATION:  Essentially within normal limits.  Neurologically  nonfocal.   LABORATORY DATA:  Routine admission labs essentially within normal limits.   MENTAL STATUS EXAM:  Alert and oriented.  Well-groomed.  No eye contact.  Affect flat.  Speech clear, even toned.  Mood depressed, quiet.  No eye  contact.  Thought processes endorses suicidal ideation with auditory  hallucinations.  Judgment was fair.  Cognition was fairly intact with  impaired judgment and insight and questionable impulse control, questionable  reliability.  Felt that wife keep bugging him about getting one beer  before coming home from work.  Dr. Donell Beers also recommended to avoid any  medications with addiction risk, including benzodiazepines since patient may  have a history of minimizing his alcohol use as well as addiction issues.   ADMISSION DIAGNOSES:   AXIS I:  1.  Obsessive-compulsive disorder.  2.  Major depressive disorder, recurrent, severe with psychotic features.   AXIS II:  Deferred.   AXIS III:  1.  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  2.  History of atrial fibrillation.  3.  Renal stones.  4.  Vasectomy.   AXIS IV:  Moderate (problems related to social environment and educational  problems).   AXIS V:  30/55.   HOSPITAL COURSE:  The patient was admitted and ordered routine p.r.n.  medications and underwent further monitoring.  Was encouraged to participate  in individual, group and milieu therapy.  Wife was involved in family  session and aftercare planning.  The patient was placed on [address] quite  delusional and paranoid, agitated, angry.  The patient, when asked why he  was here, angrily said you called me, you know why I'm here.  Described  history of OCD, clear suicidal ideation, clear auditory hallucinations,  responding to internal stimulus, seeing a man shadows and a voice telling  him just to end it, distressing him.  The patient was initiated on several  medications due to the severity of distress, including Klonopin initially,  Depakote, Paxil, Seroquel, Percocet pain for 24 hours and then to taper and  stop and Ambien  and Risperdal for psychotic symptoms as well as Cogentin as  prophylaxis for EPS.  Klonopin was gradually tapered and stopped and a  Librium detox protocol was used just to ensure comfortable detox off of  benzodiazepines and alcohol since patient appeared to have been minimizing  his alcohol use.  His wife found several liquor bottles.  The patient  continued to be delusional, reporting a gradual decrease in hallucinations  and depressive symptoms including suicidal ideation.  He had reported  hearing hey you, don't buy that, you don't need that and kill yourself  and described the voice as being scary and mean.  The patient developed  some edema, which he reported having had in the past but may have been  related to medications versus adding salt to diet.  The patient was  monitored medically and closely for safety  standpoint.  Gradually stabilized  on medications.  Reported a positive response to medical treatment and was  eventually discharged in improved condition, taken off of Zyprexa.  Given  medication education regarding metabolic issues, weight gain and other side  effects including EPS and TD from psychotropics.  Wife was also educated  regarding risk/benefit ratio and alternative treatments and both were  agreeable with medication regimen.   DISCHARGE MEDICATIONS:  1.  Prozac 20 mg, 2 q.a.m.  2.  Trazodone 100 mg, 2 q.h.s.  3.  Ultram 50 mg, 2 q.8h. p.r.n.  4.  Ambien 10 mg q.h.s. (repeat in 45 minutes if not asleep).  5.  Cogentin 1 mg, 1/2 at 8 a.m., 4 p.m. and 2 at 8 p.m.   Allergy to generic Prozac versus some other medication but history of Trudie Buckler syndrome was noted since the patient spent quite some time in the  hospital and required high-dose steroids for quite some time which may have  had medical sequelae.  The patient was discharged with no acute risk issues.   DISCHARGE MEDICATIONS:  1.  Seroquel 100 mg at 8 p.m.  2.  Risperdal 2.5 mg at 8 p.m., decreased to 0.5 mg at 12 p.m. and 2 mg at 8      p.m. at the time of discharge   At the time of discharge, reported mood was stable.  No dangerous ideation.  No psychotic symptoms.  No side effects from medications.   FOLLOW UP:  Dr. Earney Mallet, Dr. Leticia Clas and [facility] with Dr. Donell Beers  on Monday, November 20, 2004 at 3 p.m.  The patient was also to follow up with  Gretchen Short on Wednesday, March 22nd at 3:30 p.m.   DISCHARGE DIAGNOSES:   AXIS I:  1.  Obsessive-compulsive disorder.  2.  Major depressive disorder, recurrent, severe with psychotic features.   AXIS II:  Deferred.   AXIS III:  1.  History of Trudie Buckler syndrome, questionably from generic      formulation of Prozac or fluoxetine.  2.  History of atrial fibrillation. 3.  Renal stones.  4.  Vasectomy.   AXIS IV:  Moderate (problems related to  social environment and educational  problems).   AXIS V:  Global Assessment of Functioning on discharge 55.      JEM/MEDQ  D:  12/17/2004  T:  12/18/2004  Job:  [id number]

## 2011-01-05 NOTE — H&P (Signed)
NAME:  Anthony Roach, Anthony Roach                     ACCOUNT NO.:  [id number]   MEDICAL RECORD NO.:  [id number]                   PATIENT TYPE:   LOCATION:                                       FACILITY:  [facility]   PHYSICIAN:  Mark C. Vernie Ammons, M.D.               DATE OF BIRTH:  1961-05-06   DATE OF ADMISSION:  04/12/2004  DATE OF DISCHARGE:                                HISTORY & PHYSICAL   This is an interval history and physical.  His previous history and physical  is noted on the chart, and the changes in his history and physical from  March 23, 2004, are noted below.   The patient presented to my office this morning having passed a kidney stone  two days ago.  Yesterday he had some intermittent pain and then developed  severe right flank pain today with radiation into the testicle, associated  with nausea and vomiting.  He reports the pain was knife-like in nature.   Exam revealed the patient to be in severe pain.  Right CVAT was noted.  The  scrotum was healing nicely, and his prosthesis is in good position, no  evidence of infection.   He received 150 mg of Demerol, 25 mg of Phenergan, and 60 mg of Toradol in  the office with moderate relief of discomfort but because his pain persists,  he is admitted at this time.   A CT scan in the office revealed bilateral renal calculi, a central  calcification in the prostate that had been there previously, and some  dilatation of the right ureter but no definite stone was seen in his ureter.   IMPRESSION:  Right flank pain and ureteral colic, likely secondary to clot  colic.  He has not responded to parenteral narcotic pain medication and  therefore will be admitted at this time for hydration, PCA Dilaudid, and  observation.  I will obtain a CBC and BMP.  Urine will be strained.                                               Mark C. Vernie Ammons, M.D.    MCO/MEDQ  D:  04/12/2004  T:  04/12/2004  Job:  [id number]

## 2011-01-05 NOTE — Discharge Summary (Signed)
NAME:  Anthony Roach, Anthony Roach                     ACCOUNT NO.:  [id number]   MEDICAL RECORD NO.:  [id number]                   PATIENT TYPE:  INP   LOCATION:  0440                                 FACILITY:  [facility]   PHYSICIAN:  Melissa L. Ladona Ridgel, MD               DATE OF BIRTH:  Oct 01, 1960   DATE OF ADMISSION:  01/22/2004  DATE OF DISCHARGE:  01/28/2004                                 DISCHARGE SUMMARY   ADMISSION DIAGNOSES:  1. Stevens-Johnson syndrome.  2. Severe pain.   DISCHARGE DIAGNOSES:  1. Resolving Stevens-Johnson syndrome.  2. Gastric upset secondary to increased doses of prednisone.  3. Depression, resuming Prozac 60 mg.   MEDICATIONS AT THE TIME OF DISCHARGE:  1. Sucralfate 1 gm p.o. q.i.d. before meals and at bedtime.  2. Prednisone 50 mg at 6 a.m. and 2 p.m.  3. Ambien 10 mg p.o. q.h.s. p.r.n.  4. Atarax 25 mg q.6-8h. p.r.n. itching.  5. Colace 100 mg p.o. b.i.d.  6. Protonix 40 mg p.o. b.i.d.  7. Morphine sulfate 30 mg p.o. q.12h.  8. Zofran 4 mg p.o. q.4-6h. p.r.n. nausea.  9. Prozac 60 mg p.o. daily.  10.      Calcium with vitamin D over-the-counter.  11.      Vicodin 5/500 one to two tablets q.4-6h. p.r.n.   ACTIVITY:  The patient is to resume work as per Dr. Michaell Cowing or his primary  care physician.   DIET:  No restrictions.   SPECIAL INSTRUCTIONS:  If he develops fever, chills, nausea, vomiting, or  diarrhea, please call your primary care physician.   FOLLOWUP:  The patient is requested to please establish primary care  physician and to follow up with Dr. Michaell Cowing and has an  appointment scheduled  next week.   HISTORY OF PRESENT ILLNESS:  The patient is a 50 year old white male with  really no significant past medical history who developed a rash  approximately three weeks prior to admission. He was seen in the emergency  room and given a Medrol Dosepak. He ultimately was referred to dermatologist  who performed a biopsy in the outpatient setting, and was  diagnosed with  Stevens-Johnson syndrome. He was continued on a very high dose of prednisone  100 mg daily and there was an attempt to decrease his dose which caused a  flare in both pain and the rash. The patient was therefore admitted to the  hospital for further treatment of his pain and continued steroid therapy.  During the course of the hospitalization a workup for malignancy as a source  for his Stevens-Johnson syndrome was undertaken. CT of his head, chest,  abdomen, and pelvis were all negative. His prostate specific antigen was  within normal limits and there was no evidence for occult malignancy to  explain the possible source for his Stevens-Johnson syndrome. The patient  initially did not admit to taking Prozac. This came to our attention at his  discharge when it was discharged when it was discussed that the patient had  been taking it without telling the nursing staff. This has been discussed  with Dr. Michaell Cowing who did not feel that Prozac was causing his Stevens-Johnson  syndrome, therefore he was permitted to resume it as per an outpatient. The  patient did fairly well during the course of the hospitalization on high  dose prednisone. We did however split his dose to 50 mg in the morning and  50 mg in the afternoon for two reasons: (1) The patient was describing  significant gastric upset taking 100 mg all at once and (2) described  inability to sleep at night because his prednisone dosing was late in the  evening. Therefore, we adjusted him to a 6 a.m. and 2 p.m. schedule which  appeared to help with both aspects. Also added sucralfate to his b.i.d.  Protonix to assist in the possible gastric upset that is being caused by the  prednisone. At the time of discharge, the patient's vital signs were stable.  His physical examination revealed persistent rash to his forearm, however  the total body rash had faded quite significantly. His temperature was 97.3  and generally his  pupils equal, round, and reactive to light. Extraocular  movements are intact. His mucous membranes were moist. There was no thrush.  The neck was supple. There was no JVD. His chest was clear to auscultation  with no rales, rhonchi, or wheezes. His cardiovascular exam revealed a  regular rate and rhythm with positive S1 and S2. No S3 or S4. Abdomen was  soft, nontender, nondistended with positive bowel sounds. Extremities  revealed right and left forearms covered in rash, some dry, some in  different stages of eruption. However, significantly improved since  admission.  His most recent BUN was 15 with a creatinine of 1.0. Hemoglobin  and hematocrit were stable and 14.5 and 42.  His glucose remained in the low  120s despite the high dose of steroids. The patient had complained of some  blurred vision in the right eye which had resolved prior to coming into the  hospital. There was no evidence on physical exam for acute intervention. It  was recommended, however, that he consider an ophthalmology consult for an  outpatient since he did have some lesions surrounding the eye. At the time  of discharge the patient is stable for follow up with Dr. Michaell Cowing as an  outpatient. The patient is also requested to make an  appointment to see a  primary care physician who could follow him in addition to Dr. Michaell Cowing, who is  his dermatologist.                                               Efraim Kaufmann L. Ladona Ridgel, MD    MLT/MEDQ  D:  02/06/2004  T:  02/07/2004  Job:  [id number]   cc:   Dr. Michaell Cowing

## 2011-01-05 NOTE — Op Note (Signed)
NAME:  Anthony Roach, Anthony Roach                     ACCOUNT NO.:  [id number]   MEDICAL RECORD NO.:  [id number]                   PATIENT TYPE:  AMB   LOCATION:  [facility]                                 FACILITY:  [facility]   PHYSICIAN:  Mark C. Vernie Ammons, M.D.               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   PROCEDURE:  1. Scrotal exploration.  2. Right scrotal orchiectomy.  3. Placement of testicular prosthesis.   SURGEON:  Mark C. Vernie Ammons, M.D.   ANESTHESIA:  General.   BLOOD LOSS:  Less than 1 mL.   DRAINS:  None.   SPECIMEN:  Right testicle to pathology.   COMPLICATIONS:  None.   INDICATIONS:  The patient is a 50 year old white male, who had complications  after a vasectomy.  He had some degree of testicular atrophy and then  developed a hydrocele, underwent a hydrocelectomy.  He has had continued  discomfort on the right-hand side with continued progressive atrophy of the  right testicle.  It is now quite painful to the touch, and he desires  intervention surgically.  We discussed the procedure, its risks, and  complications, and I also offered him the placement of a testicular  prosthesis which he has elected to proceed with.   DESCRIPTION OF OPERATION:  After informed consent, the patient brought to  the major OR, placed on the table, administered general anesthesia, and then  his genitalia was sterilely scrubbed for 10 minutes and prepped with  Betadine.  Sterile drapes were applied, and the patient received 1 g of  Kefzol preop.  A midline median raphe scrotal incision was then made and  carried down to the tissue overlying the right testicle.  Some of this was  grasped with hemostats since he had had a  previous hydrocelectomy, there was  no potential space between the parietal and visceral tunica vaginalis.  I  was able to dissect down onto the testicle and circumferentially around it  freeing it up from scar tissue.  It was noted to be atrophic.  No worrisome  masses were palpable.  I therefore freed it up toward the cord and was able  to isolate the cord.  I then placed Kelly clamps to isolate the cord in two  equal portions, clamped these, and divided the cord distal to the clamps.  Then 2-0 Vicryl suture was used to suture ligate, and then a free-tie was  placed on each of the cord segments.  No further bleeding was detected in  the area of the cord, and this was released.  I then irrigated the wound  with bug juice and measured the contralateral testicle.  Measured  approximately 4.5 cm.  I chose a 4.5 cm x 2.9 cm saline-filled testicular  prosthesis.  I filled it with saline and compared its consistency with his  left testicle in an attempt to achieve a similar degree of firmness.  Once  this was achieved, a 3-0 silk suture was then placed in the inferior aspect  of the prosthesis, and I placed a finger into the most dependent portion of  the scrotum, placed an Allis clamp on the scrotal skin, inverted it, and  then placed my silk suture through this deep scrotal tissue.  Care was taken  to be sure that it did not pierce the skin, and no significant dimpling is  noted.  I then tied this securing the testicle in the dependent portion of  the right hemi-scrotum.  It was again irrigated with antibiotic solution and  the clamps that were previously placed on the peritesticular tissue were  elevated.  I then closed this tissue over the prosthesis and when I neared  completion of the closure, 0.5% plain Marcaine was placed in this potential  sac, and it was closed completely.  This was performed with a running 3-0  Vicryl suture.  A second layer of closure with 3-0 Vicryl was performed in  a  running fashion, and then the 0.5% Marcaine was injected in the subcu tissue  of the incision, and this was closed with running 3-0 chromic suture.  Collodion was then applied to the incision and fluffed Kerlix and a scrotal  support was placed on the patient.  He was awakened and taken to the  recovery room in stable, satisfactory condition.  He tolerated the procedure  well, no intraoperative complications.   He will be given a prescription for 10 Cipro 500 mg to be taken b.i.d. and  #36 Vicodin ES.  He will return to my office in 2 weeks for a recheck.                                               Mark C. Vernie Ammons, M.D.    MCO/MEDQ  D:  03/24/2004  T:  03/26/2004  Job:  [id number]

## 2011-05-09 LAB — URINALYSIS, ROUTINE W REFLEX MICROSCOPIC
Bilirubin Urine: NEGATIVE
Glucose, UA: NEGATIVE
Hgb urine dipstick: NEGATIVE
Ketones, ur: NEGATIVE
Nitrite: NEGATIVE
Protein, ur: NEGATIVE
Specific Gravity, Urine: 1.016
Urobilinogen, UA: 0.2
pH: 7

## 2011-05-09 LAB — DIFFERENTIAL
Basophils Absolute: 0
Basophils Relative: 1
Eosinophils Absolute: 0.2
Eosinophils Relative: 3
Lymphocytes Relative: 22
Lymphs Abs: 1.3
Monocytes Absolute: 0.5
Monocytes Relative: 8
Neutro Abs: 3.9
Neutrophils Relative %: 67

## 2011-05-09 LAB — CBC
HCT: 46.4
Hemoglobin: 16.6
MCHC: 35.8
MCV: 91.8
Platelets: 191
RBC: 5.05
RDW: 12.1
WBC: 5.9

## 2011-05-09 LAB — POCT CARDIAC MARKERS
CKMB, poc: <1 — ABNORMAL LOW
Myoglobin, poc: 70
Operator id: 198171
Troponin i, poc: <0.05

## 2011-05-14 LAB — URINALYSIS, ROUTINE W REFLEX MICROSCOPIC
Leukocytes, UA: NEGATIVE
Specific Gravity, Urine: 1.011
Urobilinogen, UA: 0.2

## 2011-05-14 LAB — URINE MICROSCOPIC-ADD ON

## 2013-05-13 DIAGNOSIS — N200 Calculus of kidney: Secondary | ICD-10-CM | POA: Insufficient documentation

## 2014-01-26 ENCOUNTER — Other Ambulatory Visit: Payer: Self-pay | Admitting: Family Medicine

## 2014-02-24 DIAGNOSIS — I480 Paroxysmal atrial fibrillation: Secondary | ICD-10-CM | POA: Diagnosis present

## 2014-02-24 DIAGNOSIS — N500 Atrophy of testis: Secondary | ICD-10-CM | POA: Insufficient documentation

## 2014-02-24 DIAGNOSIS — Z8719 Personal history of other diseases of the digestive system: Secondary | ICD-10-CM | POA: Insufficient documentation

## 2014-05-14 DIAGNOSIS — K635 Polyp of colon: Secondary | ICD-10-CM | POA: Insufficient documentation

## 2015-11-30 ENCOUNTER — Emergency Department (HOSPITAL_COMMUNITY): Payer: BLUE CROSS/BLUE SHIELD

## 2015-11-30 ENCOUNTER — Encounter (HOSPITAL_COMMUNITY): Payer: Self-pay | Admitting: Family Medicine

## 2015-11-30 ENCOUNTER — Emergency Department (HOSPITAL_COMMUNITY)
Admission: EM | Admit: 2015-11-30 | Discharge: 2015-11-30 | Disposition: A | Discharge status: Home / Self Care | Payer: BLUE CROSS/BLUE SHIELD | Attending: Emergency Medicine | Admitting: Emergency Medicine

## 2015-11-30 DIAGNOSIS — N23 Unspecified renal colic: Secondary | ICD-10-CM | POA: Diagnosis not present

## 2015-11-30 DIAGNOSIS — F17200 Nicotine dependence, unspecified, uncomplicated: Secondary | ICD-10-CM | POA: Insufficient documentation

## 2015-11-30 DIAGNOSIS — Z7982 Long term (current) use of aspirin: Secondary | ICD-10-CM | POA: Insufficient documentation

## 2015-11-30 DIAGNOSIS — N202 Calculus of kidney with calculus of ureter: Secondary | ICD-10-CM | POA: Diagnosis not present

## 2015-11-30 DIAGNOSIS — F419 Anxiety disorder, unspecified: Secondary | ICD-10-CM | POA: Insufficient documentation

## 2015-11-30 DIAGNOSIS — N201 Calculus of ureter: Secondary | ICD-10-CM

## 2015-11-30 DIAGNOSIS — N200 Calculus of kidney: Secondary | ICD-10-CM

## 2015-11-30 DIAGNOSIS — Z79899 Other long term (current) drug therapy: Secondary | ICD-10-CM | POA: Diagnosis not present

## 2015-11-30 DIAGNOSIS — R61 Generalized hyperhidrosis: Secondary | ICD-10-CM | POA: Diagnosis not present

## 2015-11-30 DIAGNOSIS — R5383 Other fatigue: Secondary | ICD-10-CM | POA: Insufficient documentation

## 2015-11-30 DIAGNOSIS — R109 Unspecified abdominal pain: Secondary | ICD-10-CM | POA: Diagnosis present

## 2015-11-30 HISTORY — DX: Anxiety disorder, unspecified: F41.9

## 2015-11-30 LAB — CBC WITH DIFFERENTIAL/PLATELET
BASOS ABS: 0.1 10*3/uL (ref 0.0–0.1)
BASOS PCT: 1 %
EOS ABS: 0.3 10*3/uL (ref 0.0–0.7)
Eosinophils Relative: 4 %
HCT: 42.4 % (ref 39.0–52.0)
HEMOGLOBIN: 15.1 g/dL (ref 13.0–17.0)
Lymphocytes Relative: 24 %
Lymphs Abs: 1.7 10*3/uL (ref 0.7–4.0)
MCH: 33.1 pg (ref 26.0–34.0)
MCHC: 35.6 g/dL (ref 30.0–36.0)
MCV: 93 fL (ref 78.0–100.0)
MONOS PCT: 6 %
Monocytes Absolute: 0.5 10*3/uL (ref 0.1–1.0)
NEUTROS PCT: 64 %
Neutro Abs: 4.6 10*3/uL (ref 1.7–7.7)
Platelets: 168 10*3/uL (ref 150–400)
RBC: 4.56 MIL/uL (ref 4.22–5.81)
RDW: 12.2 % (ref 11.5–15.5)
WBC: 7.1 10*3/uL (ref 4.0–10.5)

## 2015-11-30 LAB — GRAM STAIN: SPECIAL REQUESTS: NORMAL

## 2015-11-30 LAB — URINALYSIS, ROUTINE W REFLEX MICROSCOPIC
BILIRUBIN URINE: NEGATIVE
Glucose, UA: NEGATIVE mg/dL
KETONES UR: NEGATIVE mg/dL
Leukocytes, UA: NEGATIVE
NITRITE: NEGATIVE
Protein, ur: NEGATIVE mg/dL
SPECIFIC GRAVITY, URINE: 1.023 (ref 1.005–1.030)
pH: 7 (ref 5.0–8.0)

## 2015-11-30 LAB — BASIC METABOLIC PANEL
ANION GAP: 10 (ref 5–15)
BUN: 13 mg/dL (ref 6–20)
CHLORIDE: 108 mmol/L (ref 101–111)
CO2: 23 mmol/L (ref 22–32)
CREATININE: 1.07 mg/dL (ref 0.61–1.24)
Calcium: 9.7 mg/dL (ref 8.9–10.3)
GFR calc non Af Amer: >60 mL/min (ref >60)
Glucose, Bld: 88 mg/dL (ref 65–99)
Potassium: 4.1 mmol/L (ref 3.5–5.1)
SODIUM: 141 mmol/L (ref 135–145)

## 2015-11-30 LAB — URINE MICROSCOPIC-ADD ON: WBC, UA: NONE SEEN WBC/hpf (ref 0–5)

## 2015-11-30 MED ORDER — ONDANSETRON HCL 4 MG/2ML IJ SOLN
INTRAMUSCULAR | Status: AC
  Filled 2015-11-30: qty 2

## 2015-11-30 MED ORDER — KETOROLAC TROMETHAMINE 15 MG/ML IJ SOLN
15.0000 mg | Freq: Once | INTRAMUSCULAR | Status: AC
  Administered 2015-11-30: 15 mg via INTRAVENOUS
  Filled 2015-11-30: qty 1

## 2015-11-30 MED ORDER — OXYCODONE-ACETAMINOPHEN 5-325 MG PO TABS
1.0000 | ORAL_TABLET | ORAL | Status: DC | PRN
Start: 2015-11-30 — End: 2016-03-05

## 2015-11-30 MED ORDER — IBUPROFEN 600 MG PO TABS: 600.0000 mg | ORAL_TABLET | Freq: Three times a day (TID) | ORAL | Status: DC | PRN

## 2015-11-30 MED ORDER — SODIUM CHLORIDE 0.9 % IV BOLUS (SEPSIS)
1000.0000 mL | Freq: Once | INTRAVENOUS | Status: AC
  Administered 2015-11-30: 1000 mL via INTRAVENOUS

## 2015-11-30 MED ORDER — ONDANSETRON 4 MG PO TBDP: 4.0000 mg | ORAL_TABLET | Freq: Three times a day (TID) | ORAL | Status: DC | PRN

## 2015-11-30 MED ORDER — ONDANSETRON HCL 4 MG/2ML IJ SOLN
4.0000 mg | Freq: Once | INTRAMUSCULAR | Status: AC
  Administered 2015-11-30: 4 mg via INTRAVENOUS
  Filled 2015-11-30: qty 2

## 2015-11-30 MED ORDER — HYDROMORPHONE HCL 1 MG/ML IJ SOLN
2.0000 mg | Freq: Once | INTRAMUSCULAR | Status: AC
  Administered 2015-11-30: 2 mg via INTRAVENOUS
  Filled 2015-11-30: qty 2

## 2015-11-30 MED ORDER — TAMSULOSIN HCL 0.4 MG PO CAPS
0.4000 mg | ORAL_CAPSULE | Freq: Once | ORAL | Status: AC
  Administered 2015-11-30: 0.4 mg via ORAL
  Filled 2015-11-30: qty 1

## 2015-11-30 MED ORDER — HYDROMORPHONE HCL 1 MG/ML IJ SOLN
1.0000 mg | Freq: Once | INTRAMUSCULAR | Status: AC
  Administered 2015-11-30: 1 mg via INTRAVENOUS
  Filled 2015-11-30: qty 1

## 2015-11-30 MED ORDER — OXYCODONE-ACETAMINOPHEN 5-325 MG PO TABS
2.0000 | ORAL_TABLET | Freq: Once | ORAL | Status: AC
  Administered 2015-11-30: 2 via ORAL
  Filled 2015-11-30: qty 2

## 2015-11-30 MED ORDER — FENTANYL CITRATE (PF) 100 MCG/2ML IJ SOLN
INTRAMUSCULAR | Status: AC
  Filled 2015-11-30: qty 2

## 2015-11-30 NOTE — ED Notes (Signed)
Pt here for left flank pain for the past few days. St she has been drinking lots of water and it seemed better this am but worse this afternoon and now nausea, vomiting.

## 2015-11-30 NOTE — ED Notes (Signed)
Pt verbalized understanding of discharge instructions and follow up care. Vital signs stable and pain improved at discharge.

## 2015-11-30 NOTE — ED Provider Notes (Signed)
CSN: [id number]     Arrival date & time 11/30/15  1734 History   First MD Initiated Contact with Patient 11/30/15 1756     Chief Complaint  Patient presents with  . Flank Pain     (Consider location/radiation/quality/duration/timing/severity/associated sxs/prior Treatment) Patient is a 55 y.o. male presenting with flank pain. The history is provided by the patient.  Flank Pain This is a new problem. The current episode started in the past 7 days. The problem occurs constantly. The problem has been unchanged. Associated symptoms include fatigue, nausea and vomiting. Pertinent negatives include no abdominal pain, chest pain, congestion, coughing, fever, headaches, neck pain, rash or weakness. Nothing aggravates the symptoms. He has tried nothing for the symptoms. The treatment provided no relief.    Past Medical History  Diagnosis Date  . Anxiety    History reviewed. No pertinent past surgical history. History reviewed. No pertinent family history. Social History  Substance Use Topics  . Smoking status: Current Every Day Smoker  . Smokeless tobacco: None  . Alcohol Use: Yes     Comment: occ    Review of Systems  Constitutional: Positive for fatigue. Negative for fever.  HENT: Negative for congestion and rhinorrhea.   Eyes: Negative for visual disturbance.  Respiratory: Negative for cough and shortness of breath.   Cardiovascular: Negative for chest pain and leg swelling.  Gastrointestinal: Positive for nausea and vomiting. Negative for abdominal pain and diarrhea.  Genitourinary: Positive for flank pain. Negative for dysuria.  Musculoskeletal: Negative for neck pain.  Skin: Negative for rash.  Neurological: Negative for syncope, weakness and headaches.      Allergies  Review of patient's allergies indicates no known allergies.  Home Medications   Prior to Admission medications   Medication Sig Start Date End Date Taking? Authorizing Provider  aspirin 81 MG tablet Take  81 mg by mouth daily.   Yes Historical Provider, MD  B Complex Vitamins (VITAMIN B COMPLEX PO) Take 1 tablet by mouth daily.   Yes Historical Provider, MD  escitalopram (LEXAPRO) 20 MG tablet Take 20 mg by mouth daily.   Yes Historical Provider, MD  metoprolol tartrate (LOPRESSOR) 25 MG tablet Take 25 mg by mouth 2 (two) times daily as needed. Take as needed for anxiety per patient   Yes Historical Provider, MD  Multiple Vitamin (MULTIVITAMIN WITH MINERALS) TABS tablet Take 1 tablet by mouth daily.   Yes Historical Provider, MD  naphazoline-pheniramine (NAPHCON-A) 0.025-0.3 % ophthalmic solution Place 1 drop into both eyes 4 (four) times daily as needed for irritation.   Yes Historical Provider, MD  vitamin E 100 UNIT capsule Take 100 Units by mouth daily.   Yes Historical Provider, MD  ibuprofen (ADVIL,MOTRIN) 600 MG tablet Take 1 tablet (600 mg total) by mouth every 8 (eight) hours as needed for mild pain or moderate pain. 11/30/15   Duffy Bruce, MD  ondansetron (ZOFRAN ODT) 4 MG disintegrating tablet Take 1 tablet (4 mg total) by mouth every 8 (eight) hours as needed for nausea or vomiting. 11/30/15   Duffy Bruce, MD  oxyCODONE-acetaminophen (PERCOCET/ROXICET) 5-325 MG tablet Take 1 tablet by mouth every 4 (four) hours as needed for severe pain. 11/30/15   Duffy Bruce, MD   BP 145/92 mmHg  Pulse 69  Temp(Src) 98.1 F (36.7 C)  Resp 18  SpO2 96% Physical Exam  Constitutional: He is oriented to person, place, and time. He appears well-developed and well-nourished. He appears distressed (Appears in pain, diaphoretic).  HENT:  Head:  Normocephalic and atraumatic.  Mouth/Throat: No oropharyngeal exudate.  Eyes: Pupils are equal, round, and reactive to light.  Neck: Normal range of motion. Neck supple.  Cardiovascular: Normal rate and normal heart sounds.  Exam reveals no friction rub.   No murmur heard. Pulmonary/Chest: Effort normal and breath sounds normal. No respiratory distress. He  has no wheezes. He has no rales.  Abdominal: Soft. Bowel sounds are normal. He exhibits no distension. There is tenderness (Mild tenderness to left upper quadrant and left flank). There is no rebound and no guarding.  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  Musculoskeletal: He exhibits no edema.  Neurological: He is alert and oriented to person, place, and time.  Skin: Skin is warm. No rash noted.  Nursing note and vitals reviewed.   ED Course  Procedures (including critical care time) Labs Review Labs Reviewed  URINALYSIS, ROUTINE W REFLEX MICROSCOPIC (NOT AT [facility]) - Abnormal; Notable for the following:    APPearance CLOUDY (*)    Hgb urine dipstick LARGE (*)    All other components within normal limits  URINE MICROSCOPIC-ADD ON - Abnormal; Notable for the following:    Squamous Epithelial / LPF 0-5 (*)    Bacteria, UA RARE (*)    All other components within normal limits  GRAM STAIN  CBC WITH DIFFERENTIAL/PLATELET  BASIC METABOLIC PANEL    Imaging Review Ct Renal Stone Study  11/30/2015  CLINICAL DATA:  Acute onset of severe left flank pain and hematuria. Initial encounter. EXAM: CT ABDOMEN AND PELVIS WITHOUT CONTRAST TECHNIQUE: Multidetector CT imaging of the abdomen and pelvis was performed following the standard protocol without IV contrast. COMPARISON:  CT of the abdomen and pelvis from 08/22/2007 FINDINGS: The visualized lung bases are clear. The liver and spleen are unremarkable in appearance. The gallbladder is within normal limits. The pancreas and adrenal glands are unremarkable. There is a 6 mm stone at the right ureteropelvic junction, and a 4 mm stone within the mid to distal left ureter, without evidence of hydronephrosis. These may reflect intermittently obstructing stones, given the patient's symptoms. Nonspecific perinephric stranding is noted bilaterally. Nonobstructing bilateral renal stones measure up to 8 mm in size. No free fluid is  identified. The small bowel is unremarkable in appearance. The stomach is within normal limits. No acute vascular abnormalities are seen. Minimal calcification is noted along the abdominal aorta. The appendix is not definitely characterized; there is no evidence of appendicitis. The colon is largely decompressed and is grossly unremarkable in appearance. The bladder is decompressed and not well assessed. The prostate is enlarged, measuring 5.6 cm in transverse dimension. No inguinal lymphadenopathy is seen. No acute osseous abnormalities are identified. Facet disease is noted along the lower lumbar spine. IMPRESSION: 1. 6 mm stone at the right ureteropelvic junction, and 4 mm stone within the mid to distal left ureter, without evidence of hydronephrosis. Given the patient's symptoms, these may reflect intermittently obstructing stones. 2. Nonobstructing bilateral stones measure up to 8 mm in size. 3. Enlarged prostate noted. Electronically Signed   By: Garald Balding M.D.   On: 11/30/2015 20:51   I have personally reviewed and evaluated these images and lab results as part of my medical decision-making.   EKG Interpretation None      MDM   55 yo M with PMHx of recurrent nephrolithiasis who p/w acute onset left flank pain with hematuria. VSS and WNL. Primary suspicion is renal colic with ureteral stone. No fever, chills, CVAT, or  signs of pyelonephritis or infected stone. No evidence of orchitis, epididymitis, or testicular torsion on exam. No diarrhea or sx to suggest diverticulitis or colitis. Will check CT given h/o complicated stones, give analgesia and fluids.  Labs/imaging as above. CBC with no leukocytosis. BMP with normal renal function. UA with +blood, no pyuria, no organisms on gram stain, c/w uncomplicated, non-infected stone. CT shows b/l kidney stones, with left 4 mm stone in ureter with no hydronephrosis. 6 mm stone on right, also with no hydro. Pain controlled with IVF, analgesia, toradol.  Discussed management options with pt. He prefers outpt management. Will d/c with analgesia, encourage fluids, and urology f/u. Pt in agreement. He has flomax at home but this was also called into his pharmacy if needed.  Clinical Impression: 1. Ureterolithiasis   2. Renal colic   3. Bilateral kidney stones     Disposition: Discharge  Condition: Good  I have discussed the results, Dx and Tx plan with the pt(& family if present). He/she/they expressed understanding and agree(s) with the plan. Discharge instructions discussed at great length. Strict return precautions discussed and pt &/or family have verbalized understanding of the instructions. No further questions at time of discharge.    Discharge Medication List as of 11/30/2015 10:07 PM    START taking these medications   Details  ibuprofen (ADVIL,MOTRIN) 600 MG tablet Take 1 tablet (600 mg total) by mouth every 8 (eight) hours as needed for mild pain or moderate pain., Starting 11/30/2015, Until Discontinued, Print    ondansetron (ZOFRAN ODT) 4 MG disintegrating tablet Take 1 tablet (4 mg total) by mouth every 8 (eight) hours as needed for nausea or vomiting., Starting 11/30/2015, Until Discontinued, Print    oxyCODONE-acetaminophen (PERCOCET/ROXICET) 5-325 MG tablet Take 1 tablet by mouth every 4 (four) hours as needed for severe pain., Starting 11/30/2015, Until Discontinued, Print        Follow Up: Urologist   Follow-up with your Urologist in the next week   Pt seen in conjunction with Dr. Nickola Major, MD 12/01/15 [place], MD 12/01/15 1723

## 2016-03-04 ENCOUNTER — Encounter (HOSPITAL_COMMUNITY): Payer: Self-pay | Admitting: Oncology

## 2016-03-04 ENCOUNTER — Emergency Department (HOSPITAL_COMMUNITY)
Admission: EM | Admit: 2016-03-04 | Discharge: 2016-03-05 | Disposition: A | Discharge status: Home / Self Care | Payer: BLUE CROSS/BLUE SHIELD | Attending: Physician Assistant | Admitting: Physician Assistant

## 2016-03-04 ENCOUNTER — Emergency Department (HOSPITAL_COMMUNITY): Payer: BLUE CROSS/BLUE SHIELD

## 2016-03-04 DIAGNOSIS — R109 Unspecified abdominal pain: Secondary | ICD-10-CM | POA: Diagnosis present

## 2016-03-04 DIAGNOSIS — F17200 Nicotine dependence, unspecified, uncomplicated: Secondary | ICD-10-CM | POA: Diagnosis not present

## 2016-03-04 DIAGNOSIS — Z7982 Long term (current) use of aspirin: Secondary | ICD-10-CM | POA: Insufficient documentation

## 2016-03-04 DIAGNOSIS — N200 Calculus of kidney: Secondary | ICD-10-CM | POA: Diagnosis not present

## 2016-03-04 DIAGNOSIS — Z79899 Other long term (current) drug therapy: Secondary | ICD-10-CM | POA: Diagnosis not present

## 2016-03-04 HISTORY — DX: Disorder of kidney and ureter, unspecified: N28.9

## 2016-03-04 LAB — CBC
HCT: 44.8 % (ref 39.0–52.0)
Hemoglobin: 16.1 g/dL (ref 13.0–17.0)
MCH: 33.3 pg (ref 26.0–34.0)
MCHC: 35.9 g/dL (ref 30.0–36.0)
MCV: 92.8 fL (ref 78.0–100.0)
PLATELETS: 220 10*3/uL (ref 150–400)
RBC: 4.83 MIL/uL (ref 4.22–5.81)
RDW: 12.1 % (ref 11.5–15.5)
WBC: 11 10*3/uL — ABNORMAL HIGH (ref 4.0–10.5)

## 2016-03-04 LAB — BASIC METABOLIC PANEL
Anion gap: 8 (ref 5–15)
BUN: 20 mg/dL (ref 6–20)
CO2: 27 mmol/L (ref 22–32)
CREATININE: 1.42 mg/dL — AB (ref 0.61–1.24)
Calcium: 10.3 mg/dL (ref 8.9–10.3)
Chloride: 104 mmol/L (ref 101–111)
GFR, EST NON AFRICAN AMERICAN: 55 mL/min — AB (ref >60)
Glucose, Bld: 89 mg/dL (ref 65–99)
POTASSIUM: 4.2 mmol/L (ref 3.5–5.1)
SODIUM: 139 mmol/L (ref 135–145)

## 2016-03-04 LAB — URINE MICROSCOPIC-ADD ON: SQUAMOUS EPITHELIAL / LPF: NONE SEEN

## 2016-03-04 LAB — URINALYSIS, ROUTINE W REFLEX MICROSCOPIC
BILIRUBIN URINE: NEGATIVE
Glucose, UA: NEGATIVE mg/dL
KETONES UR: NEGATIVE mg/dL
LEUKOCYTES UA: NEGATIVE
NITRITE: NEGATIVE
PH: 5.5 (ref 5.0–8.0)
PROTEIN: NEGATIVE mg/dL
Specific Gravity, Urine: 1.028 (ref 1.005–1.030)

## 2016-03-04 MED ORDER — SODIUM CHLORIDE 0.9 % IV BOLUS (SEPSIS)
1000.0000 mL | Freq: Once | INTRAVENOUS | Status: AC
  Administered 2016-03-04: 1000 mL via INTRAVENOUS

## 2016-03-04 MED ORDER — MORPHINE SULFATE (PF) 4 MG/ML IV SOLN
4.0000 mg | Freq: Once | INTRAVENOUS | Status: AC
  Administered 2016-03-04: 4 mg via INTRAVENOUS
  Filled 2016-03-04: qty 1

## 2016-03-04 MED ORDER — HYDROMORPHONE HCL 1 MG/ML IJ SOLN
1.0000 mg | Freq: Once | INTRAMUSCULAR | Status: AC
  Administered 2016-03-04: 1 mg via INTRAVENOUS
  Filled 2016-03-04: qty 1

## 2016-03-04 MED ORDER — KETOROLAC TROMETHAMINE 30 MG/ML IJ SOLN
30.0000 mg | Freq: Once | INTRAMUSCULAR | Status: AC
  Administered 2016-03-04: 30 mg via INTRAVENOUS
  Filled 2016-03-04: qty 1

## 2016-03-04 MED ORDER — ONDANSETRON HCL 4 MG/2ML IJ SOLN
4.0000 mg | Freq: Once | INTRAMUSCULAR | Status: AC
  Administered 2016-03-04: 4 mg via INTRAVENOUS
  Filled 2016-03-04: qty 2

## 2016-03-04 NOTE — ED Notes (Signed)
RN starting IV, drawing labs 

## 2016-03-04 NOTE — ED Provider Notes (Signed)
CSN: [id number]     Arrival date & time 03/04/16  1947 History   First MD Initiated Contact with Patient 03/04/16 2014     Chief Complaint  Patient presents with  . Flank Pain     (Consider location/radiation/quality/duration/timing/severity/associated sxs/prior Treatment) HPI Comments: Patient presents emergency department with chief complaint of right flank pain. He has an extensive history of known kidney stones. States that his pain is currently uncontrollable. He reports calling the urology offices and was told to come to the emergency department. He reports associated decreased urine and hematuria. Reports associated nausea. States pain is severe.  States that he is concerned that he may have an infection because of change in urination. Reports that he has required interventions in the past.  The history is provided by the patient. No language interpreter was used.    Past Medical History  Diagnosis Date  . Anxiety   . Renal disorder     kidney stones   History reviewed. No pertinent past surgical history. No family history on file. Social History  Substance Use Topics  . Smoking status: Current Every Day Smoker  . Smokeless tobacco: Never Used  . Alcohol Use: Yes     Comment: occ    Review of Systems  Gastrointestinal: Positive for nausea.  Genitourinary: Positive for flank pain.  All other systems reviewed and are negative.     Allergies  Review of patient's allergies indicates no known allergies.  Home Medications   Prior to Admission medications   Medication Sig Start Date End Date Taking? Authorizing Provider  aspirin 81 MG tablet Take 81 mg by mouth daily.    Historical Provider, MD  B Complex Vitamins (VITAMIN B COMPLEX PO) Take 1 tablet by mouth daily.    Historical Provider, MD  escitalopram (LEXAPRO) 20 MG tablet Take 20 mg by mouth daily.    Historical Provider, MD  ibuprofen (ADVIL,MOTRIN) 600 MG tablet Take 1 tablet (600 mg total) by mouth every 8  (eight) hours as needed for mild pain or moderate pain. 11/30/15   Duffy Bruce, MD  metoprolol tartrate (LOPRESSOR) 25 MG tablet Take 25 mg by mouth 2 (two) times daily as needed. Take as needed for anxiety per patient    Historical Provider, MD  Multiple Vitamin (MULTIVITAMIN WITH MINERALS) TABS tablet Take 1 tablet by mouth daily.    Historical Provider, MD  naphazoline-pheniramine (NAPHCON-A) 0.025-0.3 % ophthalmic solution Place 1 drop into both eyes 4 (four) times daily as needed for irritation.    Historical Provider, MD  ondansetron (ZOFRAN ODT) 4 MG disintegrating tablet Take 1 tablet (4 mg total) by mouth every 8 (eight) hours as needed for nausea or vomiting. 11/30/15   Duffy Bruce, MD  oxyCODONE-acetaminophen (PERCOCET/ROXICET) 5-325 MG tablet Take 1 tablet by mouth every 4 (four) hours as needed for severe pain. 11/30/15   Duffy Bruce, MD  vitamin E 100 UNIT capsule Take 100 Units by mouth daily.    Historical Provider, MD   BP 119/76 mmHg  Pulse 98  Temp(Src) 98.7 F (37.1 C) (Oral)  Resp 22  Ht 5\' 9"  (1.753 m)  Wt 113.399 kg  BMI 36.90 kg/m2  SpO2 97% Physical Exam  Constitutional: He is oriented to person, place, and time. He appears well-developed and well-nourished.  Appears uncomfortable  HENT:  Head: Normocephalic and atraumatic.  Eyes: Conjunctivae and EOM are normal. Pupils are equal, round, and reactive to light. Right eye exhibits no discharge. Left eye exhibits no discharge. No  scleral icterus.  Neck: Normal range of motion. Neck supple. No JVD present.  Cardiovascular: Normal rate, regular rhythm and normal heart sounds.  Exam reveals no gallop and no friction rub.   No murmur heard. Pulmonary/Chest: Effort normal and breath sounds normal. No respiratory distress. He has no wheezes. He has no rales. He exhibits no tenderness.  Abdominal: Soft. He exhibits no distension and no mass. There is no tenderness. There is no rebound and no guarding.   Musculoskeletal: Normal range of motion. He exhibits no edema or tenderness.  Neurological: He is alert and oriented to person, place, and time.  Skin: Skin is warm and dry.  Psychiatric: He has a normal mood and affect. His behavior is normal. Judgment and thought content normal.  Nursing note and vitals reviewed.   ED Course  Procedures (including critical care time) Results for orders placed or performed during the hospital encounter of 03/04/16  Urinalysis, Routine w reflex microscopic- may I&O cath if menses  Result Value Ref Range   Color, Urine AMBER (A) YELLOW   APPearance CLOUDY (A) CLEAR   Specific Gravity, Urine 1.028 1.005 - 1.030   pH 5.5 5.0 - 8.0   Glucose, UA NEGATIVE NEGATIVE mg/dL   Hgb urine dipstick LARGE (A) NEGATIVE   Bilirubin Urine NEGATIVE NEGATIVE   Ketones, ur NEGATIVE NEGATIVE mg/dL   Protein, ur NEGATIVE NEGATIVE mg/dL   Nitrite NEGATIVE NEGATIVE   Leukocytes, UA NEGATIVE NEGATIVE  CBC  Result Value Ref Range   WBC 11.0 (H) 4.0 - 10.5 K/uL   RBC 4.83 4.22 - 5.81 MIL/uL   Hemoglobin 16.1 13.0 - 17.0 g/dL   HCT 44.8 39.0 - 52.0 %   MCV 92.8 78.0 - 100.0 fL   MCH 33.3 26.0 - 34.0 pg   MCHC 35.9 30.0 - 36.0 g/dL   RDW 12.1 11.5 - 15.5 %   Platelets 220 150 - 400 K/uL  Basic metabolic panel  Result Value Ref Range   Sodium 139 135 - 145 mmol/L   Potassium 4.2 3.5 - 5.1 mmol/L   Chloride 104 101 - 111 mmol/L   CO2 27 22 - 32 mmol/L   Glucose, Bld 89 65 - 99 mg/dL   BUN 20 6 - 20 mg/dL   Creatinine, Ser 1.42 (H) 0.61 - 1.24 mg/dL   Calcium 10.3 8.9 - 10.3 mg/dL   GFR calc non Af Amer 55 (L) >60 mL/min   GFR calc Af Amer >60 >60 mL/min   Anion gap 8 5 - 15  Urine microscopic-add on  Result Value Ref Range   Squamous Epithelial / LPF NONE SEEN NONE SEEN   WBC, UA 0-5 0 - 5 WBC/hpf   RBC / HPF TOO NUMEROUS TO COUNT 0 - 5 RBC/hpf   Bacteria, UA MANY (A) NONE SEEN   Casts HYALINE CASTS (A) NEGATIVE   Urine-Other MUCOUS PRESENT    Ct Renal  Stone Study  03/04/2016  CLINICAL DATA:  Right flank pain for 2 days. EXAM: CT ABDOMEN AND PELVIS WITHOUT CONTRAST TECHNIQUE: Multidetector CT imaging of the abdomen and pelvis was performed following the standard protocol without IV contrast. COMPARISON:  11/30/2015 FINDINGS: Lower chest and abdominal wall:  Small fatty umbilical hernia Hepatobiliary: Negative liver.No evidence of biliary obstruction or stone. Pancreas: Unremarkable. Spleen: Unremarkable. Adrenals/Urinary Tract: Negative adrenals. 5 mm stone in the distal right ureter, between the iliac vein and artery, with locally ureteral thickening and fat edema. No hydronephrosis. 5 mm distal left ureteral stone below  the iliac crossing, lower than stone seen previously. Extensive bilateral nephrolithiasis with largest stone in the lower pole left kidney measuring 7 mm. Stomach/Bowel:  No obstruction. No pericecal inflammation. Reproductive:Negative. Vascular/Lymphatic: No acute vascular abnormality. Occasional atherosclerotic calcification of the aorta. No mass or adenopathy. Other: No ascites or pneumoperitoneum. Musculoskeletal: No acute abnormalities. Lumbar facet arthropathy with vacuum phenomenon and spurring greatest at L3-4 and L4-5. IMPRESSION: 1. 5 mm right ureteral stone at the pelvic brim. Local ureteritis; no hydronephrosis. 2. 5 mm distal left ureteral stone without urinary obstruction. 3. Extensive bilateral nephrolithiasis. 4.  Aortic Atherosclerosis (ICD10-170.0) Electronically Signed   By: Monte Fantasia M.D.   On: 03/04/2016 22:51    I have personally reviewed and evaluated these images and lab results as part of my medical decision-making.   EKG Interpretation None      MDM   Final diagnoses:  Kidney stone    Patient with known kidney stones. Given Dilaudid with good relief. Holding Toradol given creatinine 1.42.  CT shows 79mm right ureteral stone.  No evidence of infection on UA.  No hydronephrosis.    11:30  PM Discussed with Dr. Matilde Sprang from urology, appreciate telephone consultation.  States that there is no indication for emergency intervention at this point.  Recommends giving toradol and morphine.  If pain does not resolve with this then admit to medicine for pain control.  Discussed with Dr. Thomasene Lot, who agrees with plan.  Still having severe pain.  Has received 3 mg dilaudid, morphine, and toradol.  Urology recommends admission to medicine given no emergent indication for urological intervention.  1:10 AM Patient has begun to feel better.  Still having significant pain, but states that he is agreeable to try PO management at home.  He will follow-up with the urologist in the morning.    Montine Circle, PA-C 03/05/16 0110  Courteney Julio Alm, MD 03/05/16 0120  Courteney Julio Alm, MD 03/05/16 [id number]

## 2016-03-04 NOTE — ED Notes (Signed)
Pt presents d/t right flank pain.  Known hx of right sided kidney stone.  Pt phoned Dr. Loel Lofty who instructed pt to come to ED.  + nausea.  Rates 10/10, stabbing in nature.  Denies urinary sx.

## 2016-03-05 DIAGNOSIS — N200 Calculus of kidney: Secondary | ICD-10-CM | POA: Diagnosis not present

## 2016-03-05 MED ORDER — OXYCODONE-ACETAMINOPHEN 5-325 MG PO TABS
2.0000 | ORAL_TABLET | Freq: Once | ORAL | Status: AC
  Administered 2016-03-05: 2 via ORAL
  Filled 2016-03-05: qty 2

## 2016-03-05 MED ORDER — OXYCODONE-ACETAMINOPHEN 5-325 MG PO TABS: 2.0000 | ORAL_TABLET | Freq: Four times a day (QID) | ORAL | Status: DC | PRN

## 2016-03-05 MED ORDER — ONDANSETRON HCL 4 MG/2ML IJ SOLN
4.0000 mg | Freq: Once | INTRAMUSCULAR | Status: AC
  Administered 2016-03-05: 4 mg via INTRAVENOUS
  Filled 2016-03-05: qty 2

## 2016-03-05 MED ORDER — ONDANSETRON HCL 4 MG PO TABS: 4.0000 mg | ORAL_TABLET | Freq: Four times a day (QID) | ORAL | Status: DC

## 2016-03-05 NOTE — Discharge Instructions (Signed)
Kidney Stones °Kidney stones (urolithiasis) are deposits that form inside your kidneys. The intense pain is caused by the stone moving through the urinary tract. When the stone moves, the ureter goes into spasm around the stone. The stone is usually passed in the urine.  °CAUSES  °· A disorder that makes certain neck glands produce too much parathyroid hormone (primary hyperparathyroidism). °· A buildup of uric acid crystals, similar to gout in your joints. °· Narrowing (stricture) of the ureter. °· A kidney obstruction present at birth (congenital obstruction). °· Previous surgery on the kidney or ureters. °· Numerous kidney infections. °SYMPTOMS  °· Feeling sick to your stomach (nauseous). °· Throwing up (vomiting). °· Blood in the urine (hematuria). °· Pain that usually spreads (radiates) to the groin. °· Frequency or urgency of urination. °DIAGNOSIS  °· Taking a history and physical exam. °· Blood or urine tests. °· CT scan. °· Occasionally, an examination of the inside of the urinary bladder (cystoscopy) is performed. °TREATMENT  °· Observation. °· Increasing your fluid intake. °· Extracorporeal shock wave lithotripsy--This is a noninvasive procedure that uses shock waves to break up kidney stones. °· Surgery may be needed if you have severe pain or persistent obstruction. There are various surgical procedures. Most of the procedures are performed with the use of small instruments. Only small incisions are needed to accommodate these instruments, so recovery time is minimized. °The size, location, and chemical composition are all important variables that will determine the proper choice of action for you. Talk to your health care provider to better understand your situation so that you will minimize the risk of injury to yourself and your kidney.  °HOME CARE INSTRUCTIONS  °· Drink enough water and fluids to keep your urine clear or pale yellow. This will help you to pass the stone or stone fragments. °· Strain  all urine through the provided strainer. Keep all particulate matter and stones for your health care provider to see. The stone causing the pain may be as small as a grain of salt. It is very important to use the strainer each and every time you pass your urine. The collection of your stone will allow your health care provider to analyze it and verify that a stone has actually passed. The stone analysis will often identify what you can do to reduce the incidence of recurrences. °· Only take over-the-counter or prescription medicines for pain, discomfort, or fever as directed by your health care provider. °· Keep all follow-up visits as told by your health care provider. This is important. °· Get follow-up X-rays if required. The absence of pain does not always mean that the stone has passed. It may have only stopped moving. If the urine remains completely obstructed, it can cause loss of kidney function or even complete destruction of the kidney. It is your responsibility to make sure X-rays and follow-ups are completed. Ultrasounds of the kidney can show blockages and the status of the kidney. Ultrasounds are not associated with any radiation and can be performed easily in a matter of minutes. °· Make changes to your daily diet as told by your health care provider. You may be told to: °¨ Limit the amount of salt that you eat. °¨ Eat 5 or more servings of fruits and vegetables each day. °¨ Limit the amount of meat, poultry, fish, and eggs that you eat. °· Collect a 24-hour urine sample as told by your health care provider. You may need to collect another urine sample every 6-12   months. °SEEK MEDICAL CARE IF: °· You experience pain that is progressive and unresponsive to any pain medicine you have been prescribed. °SEEK IMMEDIATE MEDICAL CARE IF:  °· Pain cannot be controlled with the prescribed medicine. °· You have a fever or shaking chills. °· The severity or intensity of pain increases over 18 hours and is not  relieved by pain medicine. °· You develop a new onset of abdominal pain. °· You feel faint or pass out. °· You are unable to urinate. °  °This information is not intended to replace advice given to you by your health care provider. Make sure you discuss any questions you have with your health care provider. °  °Document Released: 08/06/2005 Document Revised: 04/27/2015 Document Reviewed: 01/07/2013 °Elsevier Interactive Patient Education ©2016 [employer] ° °

## 2016-03-05 NOTE — ED Notes (Signed)
Pt ambulatory and independent at discharge.  Verbalized understanding of all discharge instructions.

## 2016-03-06 ENCOUNTER — Ambulatory Visit (HOSPITAL_COMMUNITY)
Admission: RE | Admit: 2016-03-06 | Discharge: 2016-03-06 | Disposition: A | Discharge status: Home / Self Care | Payer: BLUE CROSS/BLUE SHIELD | Source: Ambulatory Visit | Attending: Urology | Admitting: Urology

## 2016-03-06 ENCOUNTER — Ambulatory Visit (HOSPITAL_COMMUNITY): Payer: BLUE CROSS/BLUE SHIELD | Admitting: Anesthesiology

## 2016-03-06 ENCOUNTER — Encounter (HOSPITAL_COMMUNITY): Payer: Self-pay | Admitting: *Deleted

## 2016-03-06 ENCOUNTER — Other Ambulatory Visit: Payer: Self-pay | Admitting: Urology

## 2016-03-06 ENCOUNTER — Encounter (HOSPITAL_COMMUNITY)
Admission: RE | Disposition: A | Discharge status: Home / Self Care | Payer: Self-pay | Source: Ambulatory Visit | Attending: Urology

## 2016-03-06 DIAGNOSIS — N132 Hydronephrosis with renal and ureteral calculous obstruction: Secondary | ICD-10-CM | POA: Insufficient documentation

## 2016-03-06 DIAGNOSIS — E669 Obesity, unspecified: Secondary | ICD-10-CM | POA: Diagnosis not present

## 2016-03-06 DIAGNOSIS — N201 Calculus of ureter: Secondary | ICD-10-CM | POA: Diagnosis present

## 2016-03-06 DIAGNOSIS — Z79899 Other long term (current) drug therapy: Secondary | ICD-10-CM | POA: Diagnosis not present

## 2016-03-06 DIAGNOSIS — I480 Paroxysmal atrial fibrillation: Secondary | ICD-10-CM | POA: Insufficient documentation

## 2016-03-06 DIAGNOSIS — Z7982 Long term (current) use of aspirin: Secondary | ICD-10-CM | POA: Diagnosis not present

## 2016-03-06 DIAGNOSIS — Z6836 Body mass index (BMI) 36.0-36.9, adult: Secondary | ICD-10-CM | POA: Insufficient documentation

## 2016-03-06 DIAGNOSIS — N179 Acute kidney failure, unspecified: Secondary | ICD-10-CM | POA: Insufficient documentation

## 2016-03-06 DIAGNOSIS — F17200 Nicotine dependence, unspecified, uncomplicated: Secondary | ICD-10-CM | POA: Insufficient documentation

## 2016-03-06 HISTORY — PX: CYSTOSCOPY W/ URETERAL STENT PLACEMENT: SHX1429

## 2016-03-06 HISTORY — DX: Stevens-Johnson syndrome: L51.1

## 2016-03-06 HISTORY — DX: Cardiac arrhythmia, unspecified: I49.9

## 2016-03-06 LAB — HEMOGLOBIN: Hemoglobin: 14.9 g/dL (ref 13.0–17.0)

## 2016-03-06 LAB — BASIC METABOLIC PANEL
ANION GAP: 8 (ref 5–15)
BUN: 13 mg/dL (ref 6–20)
CHLORIDE: 107 mmol/L (ref 101–111)
CO2: 22 mmol/L (ref 22–32)
Calcium: 8.8 mg/dL — ABNORMAL LOW (ref 8.9–10.3)
Creatinine, Ser: 0.94 mg/dL (ref 0.61–1.24)
GFR calc non Af Amer: >60 mL/min (ref >60)
Glucose, Bld: 78 mg/dL (ref 65–99)
Potassium: 4.2 mmol/L (ref 3.5–5.1)
SODIUM: 137 mmol/L (ref 135–145)

## 2016-03-06 SURGERY — CYSTOSCOPY, WITH RETROGRADE PYELOGRAM AND URETERAL STENT INSERTION
Anesthesia: General | Site: Ureter | Laterality: Bilateral

## 2016-03-06 MED ORDER — PROPOFOL 10 MG/ML IV BOLUS
INTRAVENOUS | Status: AC
  Filled 2016-03-06: qty 20

## 2016-03-06 MED ORDER — DEXTROSE 5 % IV SOLN
2.0000 g | INTRAVENOUS | Status: AC
  Administered 2016-03-06: 2 g via INTRAVENOUS
  Filled 2016-03-06: qty 2

## 2016-03-06 MED ORDER — SUGAMMADEX SODIUM 500 MG/5ML IV SOLN
INTRAVENOUS | Status: AC
  Filled 2016-03-06: qty 5

## 2016-03-06 MED ORDER — METOPROLOL TARTRATE 25 MG PO TABS
25.0000 mg | ORAL_TABLET | Freq: Once | ORAL | Status: DC
  Filled 2016-03-06: qty 1

## 2016-03-06 MED ORDER — CIPROFLOXACIN IN D5W 400 MG/200ML IV SOLN
INTRAVENOUS | Status: AC
  Filled 2016-03-06: qty 200

## 2016-03-06 MED ORDER — KETOROLAC TROMETHAMINE 30 MG/ML IJ SOLN
INTRAMUSCULAR | Status: DC | PRN
  Administered 2016-03-06: 30 mg via INTRAVENOUS

## 2016-03-06 MED ORDER — MIDAZOLAM HCL 2 MG/2ML IJ SOLN
INTRAMUSCULAR | Status: AC
  Filled 2016-03-06: qty 2

## 2016-03-06 MED ORDER — ONDANSETRON HCL 4 MG/2ML IJ SOLN
INTRAMUSCULAR | Status: AC
  Filled 2016-03-06: qty 2

## 2016-03-06 MED ORDER — LIDOCAINE HCL (CARDIAC) 20 MG/ML IV SOLN
INTRAVENOUS | Status: DC | PRN
  Administered 2016-03-06: 100 mg via INTRAVENOUS

## 2016-03-06 MED ORDER — CIPROFLOXACIN IN D5W 400 MG/200ML IV SOLN: 400.0000 mg | INTRAVENOUS | Status: DC

## 2016-03-06 MED ORDER — LACTATED RINGERS IV SOLN: INTRAVENOUS | Status: DC

## 2016-03-06 MED ORDER — DEXAMETHASONE SODIUM PHOSPHATE 10 MG/ML IJ SOLN
INTRAMUSCULAR | Status: DC | PRN
  Administered 2016-03-06: 10 mg via INTRAVENOUS

## 2016-03-06 MED ORDER — ROCURONIUM BROMIDE 100 MG/10ML IV SOLN
INTRAVENOUS | Status: AC
  Filled 2016-03-06: qty 1

## 2016-03-06 MED ORDER — SUCCINYLCHOLINE CHLORIDE 20 MG/ML IJ SOLN
INTRAMUSCULAR | Status: DC | PRN
  Administered 2016-03-06: 100 mg via INTRAVENOUS

## 2016-03-06 MED ORDER — MIDAZOLAM HCL 2 MG/2ML IJ SOLN
INTRAMUSCULAR | Status: DC | PRN
  Administered 2016-03-06: 2 mg via INTRAVENOUS

## 2016-03-06 MED ORDER — PROPOFOL 10 MG/ML IV BOLUS
INTRAVENOUS | Status: DC | PRN
  Administered 2016-03-06: 200 mg via INTRAVENOUS

## 2016-03-06 MED ORDER — KETOROLAC TROMETHAMINE 10 MG PO TABS: 10.0000 mg | ORAL_TABLET | Freq: Four times a day (QID) | ORAL | Status: DC | PRN

## 2016-03-06 MED ORDER — SENNOSIDES-DOCUSATE SODIUM 8.6-50 MG PO TABS: 1.0000 | ORAL_TABLET | Freq: Two times a day (BID) | ORAL | Status: DC

## 2016-03-06 MED ORDER — LACTATED RINGERS IV SOLN
INTRAVENOUS | Status: DC
  Administered 2016-03-06 (×2): via INTRAVENOUS

## 2016-03-06 MED ORDER — FENTANYL CITRATE (PF) 100 MCG/2ML IJ SOLN
INTRAMUSCULAR | Status: DC | PRN
  Administered 2016-03-06: 50 ug via INTRAVENOUS
  Administered 2016-03-06: 100 ug via INTRAVENOUS
  Administered 2016-03-06 (×2): 50 ug via INTRAVENOUS

## 2016-03-06 MED ORDER — IOHEXOL 300 MG/ML  SOLN
INTRAMUSCULAR | Status: DC | PRN
  Administered 2016-03-06: 20 mL via URETHRAL

## 2016-03-06 MED ORDER — KETOROLAC TROMETHAMINE 30 MG/ML IJ SOLN
INTRAMUSCULAR | Status: AC
  Filled 2016-03-06: qty 1

## 2016-03-06 MED ORDER — DEXTROSE 5 % IV SOLN
INTRAVENOUS | Status: AC
  Filled 2016-03-06: qty 2

## 2016-03-06 MED ORDER — ROCURONIUM BROMIDE 100 MG/10ML IV SOLN
INTRAVENOUS | Status: DC | PRN
  Administered 2016-03-06: 30 mg via INTRAVENOUS

## 2016-03-06 MED ORDER — HYDROMORPHONE HCL 1 MG/ML IJ SOLN
INTRAMUSCULAR | Status: AC
  Filled 2016-03-06: qty 1

## 2016-03-06 MED ORDER — SUGAMMADEX SODIUM 200 MG/2ML IV SOLN
INTRAVENOUS | Status: DC | PRN
  Administered 2016-03-06: 300 mg via INTRAVENOUS

## 2016-03-06 MED ORDER — HYDROMORPHONE HCL 1 MG/ML IJ SOLN
0.2500 mg | INTRAMUSCULAR | Status: DC | PRN
  Administered 2016-03-06: 0.5 mg via INTRAVENOUS

## 2016-03-06 MED ORDER — PROMETHAZINE HCL 25 MG/ML IJ SOLN: 6.2500 mg | INTRAMUSCULAR | Status: DC | PRN

## 2016-03-06 MED ORDER — ONDANSETRON HCL 4 MG/2ML IJ SOLN
4.0000 mg | Freq: Once | INTRAMUSCULAR | Status: AC
  Administered 2016-03-06: 4 mg via INTRAVENOUS

## 2016-03-06 MED ORDER — PROMETHAZINE HCL 25 MG/ML IJ SOLN
25.0000 mg | INTRAMUSCULAR | Status: DC | PRN
  Administered 2016-03-06: 12.5 mg via INTRAVENOUS
  Filled 2016-03-06: qty 1

## 2016-03-06 MED ORDER — LIDOCAINE HCL (CARDIAC) 20 MG/ML IV SOLN
INTRAVENOUS | Status: AC
  Filled 2016-03-06: qty 5

## 2016-03-06 MED ORDER — CEPHALEXIN 500 MG PO CAPS: 500.0000 mg | ORAL_CAPSULE | Freq: Two times a day (BID) | ORAL | Status: DC

## 2016-03-06 MED ORDER — SODIUM CHLORIDE 0.9 % IR SOLN
Status: DC | PRN
Start: 2016-03-06 — End: 2016-03-06
  Administered 2016-03-06: 2000 mL
  Administered 2016-03-06: 1000 mL

## 2016-03-06 MED ORDER — FENTANYL CITRATE (PF) 250 MCG/5ML IJ SOLN
INTRAMUSCULAR | Status: AC
  Filled 2016-03-06: qty 5

## 2016-03-06 MED ORDER — OXYCODONE-ACETAMINOPHEN 5-325 MG PO TABS: 1.0000 | ORAL_TABLET | Freq: Four times a day (QID) | ORAL | Status: DC | PRN

## 2016-03-06 MED ORDER — HYDROMORPHONE HCL 1 MG/ML IJ SOLN
1.0000 mg | INTRAMUSCULAR | Status: DC | PRN
  Administered 2016-03-06: 1 mg via INTRAVENOUS
  Filled 2016-03-06: qty 1

## 2016-03-06 SURGICAL SUPPLY — 18 items
BAG URO CATCHER STRL LF (MISCELLANEOUS) ×3 IMPLANT
BASKET LASER NITINOL 1.9FR (BASKET) ×2 IMPLANT
BASKET ZERO TIP NITINOL 2.4FR (BASKET) IMPLANT
BSKT STON RTRVL 120 1.9FR (BASKET) ×1
BSKT STON RTRVL ZERO TP 2.4FR (BASKET)
CATH INTERMIT  6FR 70CM (CATHETERS) ×2 IMPLANT
CLOTH BEACON ORANGE TIMEOUT ST (SAFETY) ×3 IMPLANT
FIBER LASER TRAC TIP (UROLOGICAL SUPPLIES) ×2 IMPLANT
GLOVE BIOGEL M STRL SZ7.5 (GLOVE) ×3 IMPLANT
GOWN STRL REUS W/TWL LRG LVL3 (GOWN DISPOSABLE) ×6 IMPLANT
GUIDEWIRE ANG ZIPWIRE 038X150 (WIRE) ×4 IMPLANT
GUIDEWIRE STR DUAL SENSOR (WIRE) ×3 IMPLANT
MANIFOLD NEPTUNE II (INSTRUMENTS) ×3 IMPLANT
PACK CYSTO (CUSTOM PROCEDURE TRAY) ×3 IMPLANT
STENT POLARIS 5FRX26 (STENTS) ×4 IMPLANT
TUBE FEEDING 8FR 16IN STR KANG (MISCELLANEOUS) ×2 IMPLANT
TUBING CONNECTING 10 (TUBING) ×2 IMPLANT
TUBING CONNECTING 10' (TUBING) ×1

## 2016-03-06 NOTE — H&P (Signed)
Anthony Roach is an 55 y.o. male.    Chief Complaint:  Pre-op Cysto, Bilateral retrogrades, bilateral ureteroscopy / laser / stent.   HPI:   1 - Bilateral Ureteral Stones - pt with bilateral 20m ureteral stones by CT 72/9191on eval colickly flank pain. Minimal hydro, but some increase in Cr to 1.4 up from baseline <1. He is pGovernment social research officerand travels extenively.  Today "[name] is seen to proceed with urgent cysto, bilateral retrogrades / stents possible bilateral ureteroscopic stone manipulation.  No niterval fevers.   Past Medical History  Diagnosis Date  . Anxiety   . Renal disorder     kidney stones  . Dysrhythmia     A Fib/Paroxysmal Atrial Fibrilation  . Stevens-Johnson syndrome ([place] 2005    Past Surgical History  Procedure Laterality Date  . Cysto, stents    . Rotator cuff repair left shoulder      History reviewed. No pertinent family history. Social History:  reports that he has been smoking.  He has never used smokeless tobacco. He reports that he drinks alcohol. He reports that he does not use illicit drugs.  Allergies: No Known Allergies  Medications Prior to Admission  Medication Sig Dispense Refill  . aspirin EC 81 MG tablet Take 81 mg by mouth daily.    .Marland Kitchenb complex vitamins tablet Take 1 tablet by mouth 3 (three) times a week.    .Marland Kitchenibuprofen (ADVIL,MOTRIN) 600 MG tablet Take 600 mg by mouth every 8 (eight) hours as needed for mild pain.    . naphazoline-pheniramine (NAPHCON-A) 0.025-0.3 % ophthalmic solution Place 1 drop into both eyes 4 (four) times daily as needed for irritation or allergies.     .Marland Kitchenondansetron (ZOFRAN) 4 MG tablet Take 1 tablet (4 mg total) by mouth every 6 (six) hours. 12 tablet 0  . oxyCODONE-acetaminophen (PERCOCET/ROXICET) 5-325 MG tablet Take 2 tablets by mouth every 6 (six) hours as needed for severe pain. 15 tablet 0  . vitamin E 100 UNIT capsule Take 100 Units by mouth daily.    .Marland Kitchenescitalopram (LEXAPRO) 20 MG tablet Take 20 mg  by mouth daily.    . metoprolol tartrate (LOPRESSOR) 25 MG tablet Take 25 mg by mouth daily.     . ondansetron (ZOFRAN ODT) 4 MG disintegrating tablet Take 1 tablet (4 mg total) by mouth every 8 (eight) hours as needed for nausea or vomiting. (Patient not taking: Reported on 03/06/2016) 20 tablet 0    Results for orders placed or performed during the hospital encounter of 03/06/16 (from the past 48 hour(s))  Basic metabolic panel     Status: Abnormal   Collection Time: 03/06/16  2:45 PM  Result Value Ref Range   Sodium 137 135 - 145 mmol/L   Potassium 4.2 3.5 - 5.1 mmol/L   Chloride 107 101 - 111 mmol/L   CO2 22 22 - 32 mmol/L   Glucose, Bld 78 65 - 99 mg/dL   BUN 13 6 - 20 mg/dL   Creatinine, Ser 0.94 0.61 - 1.24 mg/dL   Calcium 8.8 (L) 8.9 - 10.3 mg/dL   GFR calc non Af Amer >60 >60 mL/min   GFR calc Af Amer >60 >60 mL/min    Comment: (NOTE) The eGFR has been calculated using the CKD EPI equation. This calculation has not been validated in all clinical situations. eGFR's persistently <60 mL/min signify possible Chronic Kidney Disease.    Anion gap 8 5 - 15  Hemoglobin  Status: None   Collection Time: 03/06/16  2:45 PM  Result Value Ref Range   Hemoglobin 14.9 13.0 - 17.0 g/dL   Ct Renal Stone Study  03/04/2016  CLINICAL DATA:  Right flank pain for 2 days. EXAM: CT ABDOMEN AND PELVIS WITHOUT CONTRAST TECHNIQUE: Multidetector CT imaging of the abdomen and pelvis was performed following the standard protocol without IV contrast. COMPARISON:  11/30/2015 FINDINGS: Lower chest and abdominal wall:  Small fatty umbilical hernia Hepatobiliary: Negative liver.No evidence of biliary obstruction or stone. Pancreas: Unremarkable. Spleen: Unremarkable. Adrenals/Urinary Tract: Negative adrenals. 5 mm stone in the distal right ureter, between the iliac vein and artery, with locally ureteral thickening and fat edema. No hydronephrosis. 5 mm distal left ureteral stone below the iliac crossing,  lower than stone seen previously. Extensive bilateral nephrolithiasis with largest stone in the lower pole left kidney measuring 7 mm. Stomach/Bowel:  No obstruction. No pericecal inflammation. Reproductive:Negative. Vascular/Lymphatic: No acute vascular abnormality. Occasional atherosclerotic calcification of the aorta. No mass or adenopathy. Other: No ascites or pneumoperitoneum. Musculoskeletal: No acute abnormalities. Lumbar facet arthropathy with vacuum phenomenon and spurring greatest at L3-4 and L4-5. IMPRESSION: 1. 5 mm right ureteral stone at the pelvic brim. Local ureteritis; no hydronephrosis. 2. 5 mm distal left ureteral stone without urinary obstruction. 3. Extensive bilateral nephrolithiasis. 4.  Aortic Atherosclerosis (ICD10-170.0) Electronically Signed   By: Jonathon  Watts M.D.   On: 03/04/2016 22:51    Review of Systems  Constitutional: Negative.  Negative for fever, chills and malaise/fatigue.  HENT: Negative.   Eyes: Negative.   Respiratory: Negative.   Cardiovascular: Negative.   Gastrointestinal: Negative.   Genitourinary: Positive for flank pain.  Musculoskeletal: Negative.   Skin: Negative.   Neurological: Negative.   Endo/Heme/Allergies: Negative.   Psychiatric/Behavioral: Negative.     Blood pressure 122/90, pulse 60, temperature 98 F (36.7 C), temperature source Oral, resp. rate 18, height 5' 9" (1.753 m), weight 113.399 kg (250 lb), SpO2 100 %. Physical Exam  Constitutional: He is oriented to person, place, and time. He appears well-developed.  HENT:  Head: Normocephalic.  Eyes: Pupils are equal, round, and reactive to light.  Neck: Normal range of motion.  Cardiovascular: Normal rate.   Respiratory: Effort normal.  GI: Soft.  Genitourinary:  Mild Rt > Lt CVAT  Musculoskeletal: Normal range of motion.  Neurological: He is alert and oriented to person, place, and time.  Skin: Skin is warm.  Psychiatric: He has a normal mood and affect. His behavior is  normal. Judgment and thought content normal.     Assessment/Plan  1 - Bilateral Ureteral Stones -  Risks, benefits, alternatives and plan for renal decompression as chief priority, stone removal if appears safe. Also discussed role of temporary stents which we will plan on leaving indwelling as he is trying to leave town in few days.   MANNY, THEODORE, MD 03/06/2016, 4:44 PM    

## 2016-03-06 NOTE — Op Note (Signed)
NAMEMarland Kitchen  JUELZ, NEWSUM NO.:  [id number]  MEDICAL RECORD NO.:  [id number]  LOCATION:  [facility]                         FACILITY:  [facility]  PHYSICIAN:  Alexis Frock, MD     DATE OF BIRTH:  1961/03/18  DATE OF PROCEDURE: 03/06/2016                               OPERATIVE REPORT   DIAGNOSES:  Bilateral ureteral stones.  Acute renal failure.  PROCEDURES: 1. Cystoscopy with bilateral retrograde pyelograms and interpretation. 2. Insertion of bilateral ureteral stents, 5 x 26 Polaris with tether. 3. Bilateral ureteroscopy with laser lithotripsy.  ESTIMATED BLOOD LOSS:  Nil.  COMPLICATIONS:  None.  SPECIMEN:  Bilateral ureteral stone fragments for compositional analysis.  FINDINGS: 1. Very mild hydroureteronephrosis to mobile filling defect in distal     ureter bilaterally. 2. Successful placement of bilateral ureteral stents, proximal in the     renal pelvis and distal in the urinary bladder. 3. Complete resolution of all stone fragments within the ureter     following laser lithotripsy and basket extraction.  INDICATION:  Anthony Roach is a very pleasant 55 year old gentleman with history of recurrent nephrolithiasis.  He has had a prodrome of right greater than left colicky flank pain, on and off for several months.  He was noted on axial imaging in the past few days to have bilateral ureteral stones.  There was minimal hydronephrosis at that time.  Therefore, was given a very brief trial of medical therapy, he failed to pass the stone, he was seen in the office today and it was felt that he was at substantial risk for bilateral complete obstruction and it was felt that urgent renal decompression was warranted.  We discussed placement of stent alone versus removal of the ureteral stones versus a staged approach with goal of bilateral stone free and he wished to proceed with more moderate approach with removal of bilateral ureteral stones today.  Informed consent  was obtained and placed in the medical record.  Notably, his renal function has improved and now creatinine less than 1 today.  PROCEDURE IN DETAIL:  The patient being Anthony Roach, was verified. Procedure being bilateral ureteroscopic stone manipulation was confirmed.  Procedure was carried out.  Time-out was performed. Intravenous antibiotics were administered.  General anesthesia was introduced.  The patient was placed into a low lithotomy position and sterile field was created by prepping and draping the patient's penis, perineum and proximal thighs using iodine.  Next, cystourethroscopy was performed using a 23-French rigid cystoscope with offset lens. Inspection of the anterior and posterior urethra was unremarkable. Inspection of the urinary bladder revealed no diverticula, calcifications or papular lesions.  Ureteral orifices appeared singleton bilaterally.  The left ureteral orifice was cannulated with a 6-French end-hole catheter and left retrograde pyelogram was obtained.  Left retrograde pyelogram demonstrated a single left ureter with single- system left kidney.  There was mild hydroureteronephrosis to a mobile filling defect in distal third of the ureter.  A 0.038 Zip wire was advanced at the level of the upper pole, set aside as a safety wire. Next, right retrograde pyelogram was obtained.  Right retrograde pyelogram demonstrated a single right ureter with single-system right kidney, also very mild hydroureteronephrosis to a mobile  filling defect in the distal fourth of the ureter.  A 0.038 Zip wire was advanced at the level of the upper pole and set aside as a safety wire.  An 8-French feeding tube was then placed in the urinary bladder for pressure release.  Next, semi-rigid ureteroscopy was performed to the distal right ureter, alongside a separate Sensor working wire.  The stone in question was indeed seen approximately at the level of the iliac crossing, it  appeared to be much too large for simple basketing.  As such, holmium laser energy was applied to the stone using settings of 0.2 joules and 20 hertz.  The stone was fragmented into approximately three smaller pieces and these were then sequentially grasped on their long axis, brought out in their entirety and set aside for compositional analysis.  Semi-rigid ureteroscopy of the remainder of the ureter revealed complete resolution of stone fragments, large than 1/3rd mm.  No evidence of ureteral perforation. There was impressively minimal edema at the site of prior stone impaction.  Next, left ureteroscopy was performed using semi-rigid ureteroscope and similarly, distal left ureteral stone was found in similar location as to the right, also appeared to be too large for simple basketing.  Holmium laser energy applied to the left side of the stone using same settings and was fragment approximately two smaller pieces, which were then grasped sequentially on their long axis, removed, set aside for compositional analysis.  Semi-rigid ureteroscopy of the entire length of left ureter revealed complete resolution of all stone fragments larger than 1/3rd mm and minimal mucosal edema at the prior site of stone impaction.  It was clearly felt that interval stenting would be warranted given his recent bilateral obstruction, but that a tethered approach would be acceptable, less burdensome to him. As such, a new 5 x 26 Polaris stent was placed into each ureter using fluoroscopic guidance.  Good proximal and distal deployment were noted. Tether was left in place and fashioned to the dorsum of the penis, procedure was terminated.  The patient tolerated the procedure well. There were no immediate periprocedural complications, and the patient was taken to the postanesthesia care unit in stable condition.          ______________________________ Alexis Frock, MD     TM/MEDQ  D:  03/06/2016  T:   03/06/2016  Job:  [id number]

## 2016-03-06 NOTE — Anesthesia Preprocedure Evaluation (Addendum)
Anesthesia Evaluation  Patient identified by MRN, date of birth, ID band Patient awake  General Assessment Comment:NPO since 9:30 am  Nauseated now  Reviewed: Allergy & Precautions, NPO status   Airway Mallampati: II  TM Distance: >3 FB Neck ROM: Full    Dental  (+) Teeth Intact, Dental Advisory Given   Pulmonary Current Smoker,    breath sounds clear to auscultation       Cardiovascular  Rhythm:Regular Rate:Normal     Neuro/Psych    GI/Hepatic   Endo/Other    Renal/GU Renal diseaseRenal stones     Musculoskeletal   Abdominal (+) + obese,   Peds  Hematology   Anesthesia Other Findings   Reproductive/Obstetrics                            Anesthesia Physical Anesthesia Plan  ASA: I and emergent  Anesthesia Plan: General   Post-op Pain Management:    Induction: Intravenous  Airway Management Planned: Oral ETT  Additional Equipment:   Intra-op Plan:   Post-operative Plan: Extubation in OR  Informed Consent: I have reviewed the patients History and Physical, chart, labs and discussed the procedure including the risks, benefits and alternatives for the proposed anesthesia with the patient or authorized representative who has indicated his/her understanding and acceptance.   Dental advisory given  Plan Discussed with:   Anesthesia Plan Comments:         Anesthesia Quick Evaluation

## 2016-03-06 NOTE — Anesthesia Postprocedure Evaluation (Signed)
Anesthesia Post Note  Patient: Anthony Roach  Procedure(s) Performed: Procedure(s) (LRB): CYSTOSCOPY WITH BILATERAL RETROGRADE PYELOGRAM/BILATERAL URETEROSCOPY AND BILATERAL LASER APPLICATION, BILATERAL URETERAL STENT PLACEMENT (Bilateral)  Patient location during evaluation: PACU Anesthesia Type: General Level of consciousness: awake and alert Pain management: pain level controlled Vital Signs Assessment: post-procedure vital signs reviewed and stable Respiratory status: spontaneous breathing, nonlabored ventilation, respiratory function stable and patient connected to nasal cannula oxygen Cardiovascular status: blood pressure returned to baseline and stable Postop Assessment: no signs of nausea or vomiting Anesthetic complications: no    Last Vitals:  Filed Vitals:   03/06/16 1826 03/06/16 1852  BP: 143/85 136/84  Pulse:  79  Temp: 36.7 C 37.3 C  Resp:  16    Last Pain:  Filed Vitals:   03/06/16 1854  PainSc: 8                  Eustolia Drennen,JAMES TERRILL

## 2016-03-06 NOTE — Transfer of Care (Signed)
Immediate Anesthesia Transfer of Care Note  Patient: Anthony Roach  Procedure(s) Performed: Procedure(s): CYSTOSCOPY WITH BILATERAL RETROGRADE PYELOGRAM/BILATERAL URETEROSCOPY AND BILATERAL LASER APPLICATION, BILATERAL URETERAL STENT PLACEMENT (Bilateral)  Patient Location: PACU  Anesthesia Type:General  Level of Consciousness: awake, alert  and oriented  Airway & Oxygen Therapy: Patient Spontanous Breathing and Patient connected to face mask oxygen  Post-op Assessment: Report given to RN and Post -op Vital signs reviewed and stable  Post vital signs: Reviewed and stable  Last Vitals:  Filed Vitals:   03/06/16 1610 03/06/16 1615  BP:    Pulse: 61 60  Temp:    Resp:      Last Pain:  Filed Vitals:   03/06/16 1616  PainSc: 8       Patients Stated Pain Goal: 5 ([id number])  Complications: No apparent anesthesia complications

## 2016-03-06 NOTE — Progress Notes (Signed)
Dr Tresa Moore states pt does not have to void before discharge

## 2016-03-06 NOTE — Discharge Instructions (Signed)
1 - You may have urinary urgency (bladder spasms) and bloody urine on / off with stent in place. This is normal.  2 - Call MD or go to ER for fever >102, severe pain / nausea / vomiting not relieved by medications, or acute change in medical status  3  - Removed tethered stents on Thursday morning by pulling on string, then blue-white plastic tubing, and discarding. There are two stents. Office is open Thursday if any acute issues arise.    General Anesthesia, Adult, Care After Refer to this sheet in the next few weeks. These instructions provide you with information on caring for yourself after your procedure. Your health care provider may also give you more specific instructions. Your treatment has been planned according to current medical practices, but problems sometimes occur. Call your health care provider if you have any problems or questions after your procedure. WHAT TO EXPECT AFTER THE PROCEDURE After the procedure, it is typical to experience:  Sleepiness.  Nausea and vomiting. HOME CARE INSTRUCTIONS  For the first 24 hours after general anesthesia:  Have a responsible person with you.  Do not drive a car. If you are alone, do not take public transportation.  Do not drink alcohol.  Do not take medicine that has not been prescribed by your health care provider.  Do not sign important papers or make important decisions.  You may resume a normal diet and activities as directed by your health care provider.  Change bandages (dressings) as directed.  If you have questions or problems that seem related to general anesthesia, call the hospital and ask for the anesthetist or anesthesiologist on call. SEEK MEDICAL CARE IF:  You have nausea and vomiting that continue the day after anesthesia.  You develop a rash. SEEK IMMEDIATE MEDICAL CARE IF:   You have difficulty breathing.  You have chest pain.  You have any allergic problems.   This information is not intended to  replace advice given to you by your health care provider. Make sure you discuss any questions you have with your health care provider.   Document Released: 11/12/2000 Document Revised: 08/27/2014 Document Reviewed: 12/05/2011 Elsevier Interactive Patient Education [employer].

## 2016-03-06 NOTE — Progress Notes (Signed)
Metoprolol last taken on 03/03/16. Patient states he takes for anxiety and not blood pressure or cardiac related issues. He states he does not take on daily basis. Notified Dr. Orene Desanctis. Not given per Dr. Orene Desanctis.

## 2016-03-06 NOTE — Progress Notes (Signed)
Pharmacy Antibiotic Note  Anthony Roach is a 55 y.o. male admitted on  03/06/2016. Pharmacy has been consulted for ceftriaxone dosing for surgical prophylaxis  Plan: Ceftriaxone 2 gr IV x 1   Height: 5\' 9"  (175.3 cm) Weight: 250 lb (113.399 kg) IBW/kg (Calculated) : 70.7  Temp (24hrs), Avg:98 F (36.7 C), Min:98 F (36.7 C), Max:98 F (36.7 C)   Recent Labs Lab 03/04/16 2014  WBC 11.0*  CREATININE 1.42*    Estimated Creatinine Clearance: 73.9 mL/min (by C-G formula based on Cr of 1.42).    No Known Allergies    Thank you for allowing pharmacy to be a part of this patient's care.   Royetta Asal, PharmD, BCPS Pager [phone] 03/06/2016 2:35 PM

## 2016-03-06 NOTE — Anesthesia Procedure Notes (Signed)
Procedure Name: Intubation Date/Time: 03/06/2016 5:00 PM Performed by: Danley Danker L Patient Re-evaluated:Patient Re-evaluated prior to inductionOxygen Delivery Method: Circle system utilized Preoxygenation: Pre-oxygenation with 100% oxygen Intubation Type: IV induction Ventilation: Mask ventilation without difficulty Laryngoscope Size: Miller and 3 Grade View: Grade I Tube type: Oral Tube size: 8.0 mm Number of attempts: 1 Airway Equipment and Method: Stylet Placement Confirmation: ETT inserted through vocal cords under direct vision,  breath sounds checked- equal and bilateral and positive ETCO2 Secured at: 23 cm Tube secured with: Tape Dental Injury: Teeth and Oropharynx as per pre-operative assessment

## 2016-03-06 NOTE — Brief Op Note (Signed)
03/06/2016  5:42 PM  PATIENT:  Anthony Roach  55 y.o. male  PRE-OPERATIVE DIAGNOSIS:  bilateral ureteral calculi  POST-OPERATIVE DIAGNOSIS:  bilateral ureteral calculi  PROCEDURE:  Procedure(s): CYSTOSCOPY WITH BILATERAL RETROGRADE PYELOGRAM/BILATERAL URETEROSCOPY AND BILATERAL LASER APPLICATION, BILATERAL URETERAL STENT PLACEMENT (Bilateral)  SURGEON:  Surgeon(s) and Role:    * Alexis Frock, MD - Primary  PHYSICIAN ASSISTANT:   ASSISTANTS: none   ANESTHESIA:   general  EBL:     BLOOD ADMINISTERED:none  DRAINS: none   LOCAL MEDICATIONS USED:  NONE  SPECIMEN:  Source of Specimen:  bilateral ureteral stones  DISPOSITION OF SPECIMEN:  [facility] for compositional analysis  COUNTS:  YES  TOURNIQUET:  * No tourniquets in log *  DICTATION: .Other Dictation: Dictation Number [id number]  PLAN OF CARE: Discharge to home after PACU  PATIENT DISPOSITION:  PACU - hemodynamically stable.   Delay start of Pharmacological VTE agent (>24hrs) due to surgical blood loss or risk of bleeding: yes

## 2016-03-08 ENCOUNTER — Encounter (HOSPITAL_COMMUNITY): Payer: Self-pay | Admitting: Urology

## 2016-11-22 DIAGNOSIS — E782 Mixed hyperlipidemia: Secondary | ICD-10-CM | POA: Insufficient documentation

## 2016-11-29 DIAGNOSIS — C4491 Basal cell carcinoma of skin, unspecified: Secondary | ICD-10-CM | POA: Insufficient documentation

## 2017-05-07 ENCOUNTER — Emergency Department (HOSPITAL_COMMUNITY)
Admission: EM | Admit: 2017-05-07 | Discharge: 2017-05-08 | Disposition: A | Discharge status: Home / Self Care | Payer: Commercial Managed Care - PPO | Attending: Emergency Medicine | Admitting: Emergency Medicine

## 2017-05-07 ENCOUNTER — Encounter (HOSPITAL_COMMUNITY): Payer: Self-pay | Admitting: Emergency Medicine

## 2017-05-07 ENCOUNTER — Emergency Department (HOSPITAL_COMMUNITY): Payer: Commercial Managed Care - PPO

## 2017-05-07 DIAGNOSIS — Z7982 Long term (current) use of aspirin: Secondary | ICD-10-CM | POA: Insufficient documentation

## 2017-05-07 DIAGNOSIS — I480 Paroxysmal atrial fibrillation: Secondary | ICD-10-CM | POA: Diagnosis not present

## 2017-05-07 DIAGNOSIS — R079 Chest pain, unspecified: Secondary | ICD-10-CM | POA: Diagnosis present

## 2017-05-07 DIAGNOSIS — Z79899 Other long term (current) drug therapy: Secondary | ICD-10-CM | POA: Insufficient documentation

## 2017-05-07 DIAGNOSIS — I4891 Unspecified atrial fibrillation: Secondary | ICD-10-CM

## 2017-05-07 DIAGNOSIS — F17200 Nicotine dependence, unspecified, uncomplicated: Secondary | ICD-10-CM | POA: Insufficient documentation

## 2017-05-07 DIAGNOSIS — F419 Anxiety disorder, unspecified: Secondary | ICD-10-CM | POA: Insufficient documentation

## 2017-05-07 LAB — CBC
HEMATOCRIT: 44.2 % (ref 39.0–52.0)
Hemoglobin: 14.8 g/dL (ref 13.0–17.0)
MCH: 31.8 pg (ref 26.0–34.0)
MCHC: 33.5 g/dL (ref 30.0–36.0)
MCV: 95.1 fL (ref 78.0–100.0)
PLATELETS: 204 10*3/uL (ref 150–400)
RBC: 4.65 MIL/uL (ref 4.22–5.81)
RDW: 12.6 % (ref 11.5–15.5)
WBC: 6.6 10*3/uL (ref 4.0–10.5)

## 2017-05-07 LAB — BASIC METABOLIC PANEL
Anion gap: 6 (ref 5–15)
BUN: 16 mg/dL (ref 6–20)
CALCIUM: 9.3 mg/dL (ref 8.9–10.3)
CO2: 26 mmol/L (ref 22–32)
CREATININE: 1.17 mg/dL (ref 0.61–1.24)
Chloride: 106 mmol/L (ref 101–111)
GFR calc Af Amer: >60 mL/min (ref >60)
GLUCOSE: 99 mg/dL (ref 65–99)
POTASSIUM: 4.3 mmol/L (ref 3.5–5.1)
SODIUM: 138 mmol/L (ref 135–145)

## 2017-05-07 LAB — I-STAT TROPONIN, ED: Troponin i, poc: 0.01 ng/mL (ref 0.00–0.08)

## 2017-05-07 LAB — MAGNESIUM: Magnesium: 2.2 mg/dL (ref 1.7–2.4)

## 2017-05-07 MED ORDER — ETOMIDATE 2 MG/ML IV SOLN
15.0000 mg | Freq: Once | INTRAVENOUS | Status: AC
  Administered 2017-05-07: 10 mg via INTRAVENOUS

## 2017-05-07 MED ORDER — ONDANSETRON HCL 4 MG/2ML IJ SOLN
INTRAMUSCULAR | Status: AC
  Filled 2017-05-07: qty 2

## 2017-05-07 MED ORDER — ETOMIDATE 2 MG/ML IV SOLN
0.3000 mg/kg | Freq: Once | INTRAVENOUS | Status: DC
  Filled 2017-05-07: qty 20

## 2017-05-07 MED ORDER — FENTANYL CITRATE (PF) 100 MCG/2ML IJ SOLN: 50.0000 ug | Freq: Once | INTRAMUSCULAR | Status: DC

## 2017-05-07 NOTE — Sedation Documentation (Signed)
Pt cardioverted at 120J, pt immediately converted to NSR. HR 80's.

## 2017-05-07 NOTE — Discharge Instructions (Signed)
Please read and follow all provided instructions.  Your diagnoses today include:  1. Atrial fibrillation with RVR ([place])     Tests performed today include: Vital signs. See below for your results today.   Medications prescribed:  Take as prescribed   Home care instructions:  Follow any educational materials contained in this packet.  Follow-up instructions: Please follow-up with the [facility] for further evaluation of symptoms and treatment   [facility] at [facility],  [place]  [id number] [address]: [phone]  Return instructions:  Please return to the Emergency Department if you do not get better, if you get worse, or new symptoms OR  - Fever (temperature greater than 101.15F)  - Bleeding that does not stop with holding pressure to the area    -Severe pain (please note that you may be more sore the day after your accident)  - Chest Pain  - Difficulty breathing  - Severe nausea or vomiting  - Inability to tolerate food and liquids  - Passing out  - Skin becoming red around your wounds  - Change in mental status (confusion or lethargy)  - New numbness or weakness    Please return if you have any other emergent concerns.  Additional Information:  Your vital signs today were: BP 110/87 (BP Location: Left Arm)    Pulse (!) 109    Temp 98.3 F (36.8 C) (Oral)    Resp 15    Ht 5\' 9"  (1.753 m)    Wt 97.5 kg (215 lb)    SpO2 100%    BMI 31.75 kg/m  If your blood pressure (BP) was elevated above 135/85 this visit, please have this repeated by your doctor within one month. ---------------

## 2017-05-07 NOTE — ED Triage Notes (Signed)
Pt arrives via POV from home with c/o chest pressure radiating to jaw/head and AFIB since 1930 today. Onset while in bed. Diaphoretic in triage, weak/thready pulses.

## 2017-05-07 NOTE — ED Notes (Signed)
EDP and RT at bedside preparing for sedation and cardioversion. Pt placed on pads, O2 with CO2 detector in place. Fluids running. Consent form signed.

## 2017-05-07 NOTE — ED Provider Notes (Signed)
[place] DEPT Provider Note   CSN: [id number] Arrival date & time: 05/07/17  2007     History   Chief Complaint Chief Complaint  Patient presents with  . Atrial Fibrillation    HPI Anthony Roach is a 56 y.o. male.  HPI  56 y.o. male with a hx of Paroxysmal Atrial Fibrillation, presents to the Emergency Department today due to chest pressure. Pt states this occurred around 1930 before going to bed. He does note hx atrial fibrillation. Notes that he gets thrown into this rhythm when he has stress and anxiety. Pt is on Metoprolol. Takes ASA. Does not take anticoagulation. Notes nausea with diaphoresis. Radiation of pain into neck. No shortness of breath. Denies CP. No abdominal pain. No cough.congestion. No URI symptoms. No other symptoms noted.   Past Medical History:  Diagnosis Date  . Anxiety   . Dysrhythmia    A Fib/Paroxysmal Atrial Fibrilation  . Renal disorder    kidney stones  . Stevens-Johnson syndrome ([place]) 2005    There are no active problems to display for this patient.   Past Surgical History:  Procedure Laterality Date  . Cysto, stents    . CYSTOSCOPY W/ URETERAL STENT PLACEMENT Bilateral 03/06/2016   Procedure: CYSTOSCOPY WITH BILATERAL RETROGRADE PYELOGRAM/BILATERAL URETEROSCOPY AND BILATERAL LASER APPLICATION, BILATERAL URETERAL STENT PLACEMENT;  Surgeon: Alexis Frock, MD;  Location: [facility];  Service: Urology;  Laterality: Bilateral;  . Rotator Cuff Repair Left Shoulder         Home Medications    Prior to Admission medications   Medication Sig Start Date End Date Taking? Authorizing Provider  aspirin EC 81 MG tablet Take 81 mg by mouth daily.   Yes [provider]  escitalopram (LEXAPRO) 20 MG tablet Take 20 mg by mouth daily.   Yes [provider]  metoprolol tartrate (LOPRESSOR) 25 MG tablet Take 25 mg by mouth daily.    Yes [provider]  cephALEXin (KEFLEX) 500 MG capsule Take 1 capsule (500 mg total) by  mouth 2 (two) times daily. X 3 days to prevent infection with stent in place 03/06/16   Alexis Frock, MD  ketorolac (TORADOL) 10 MG tablet Take 1 tablet (10 mg total) by mouth every 6 (six) hours as needed. For mild-moderate pain post-operatively Patient not taking: Reported on 05/07/2017 03/06/16   Alexis Frock, MD  ondansetron (ZOFRAN) 4 MG tablet Take 1 tablet (4 mg total) by mouth every 6 (six) hours. Patient not taking: Reported on 05/07/2017 03/05/16   Montine Circle, PA-C  oxyCODONE-acetaminophen (PERCOCET/ROXICET) 5-325 MG tablet Take 1-2 tablets by mouth every 6 (six) hours as needed for severe pain. Post-operatively 03/06/16   Alexis Frock, MD  senna-docusate (SENOKOT-S) 8.6-50 MG tablet Take 1 tablet by mouth 2 (two) times daily. While taking strong pain meds to prevent constipation 03/06/16   Alexis Frock, MD  vitamin E 100 UNIT capsule Take 100 Units by mouth daily.    [provider]    Family History History reviewed. No pertinent family history.  Social History Social History  Substance Use Topics  . Smoking status: Current Every Day Smoker  . Smokeless tobacco: Never Used  . Alcohol use Yes     Comment: occ     Allergies   Patient has no known allergies.   Review of Systems Review of Systems ROS reviewed and all are negative for acute change except as noted in the HPI.  Physical Exam Updated Vital Signs BP (!) 170/132   Pulse Marland Kitchen)  144   Temp 98.3 F (36.8 C) (Oral)   Resp 20   Ht 5\' 9"  (1.753 m)   Wt 97.5 kg (215 lb)   SpO2 99%   BMI 31.75 kg/m   Physical Exam  Constitutional: He is oriented to person, place, and time. He appears well-developed and well-nourished. No distress.  HENT:  Head: Normocephalic and atraumatic.  Right Ear: Tympanic membrane, external ear and ear canal normal.  Left Ear: Tympanic membrane, external ear and ear canal normal.  Nose: Nose normal.  Mouth/Throat: Uvula is midline, oropharynx is clear and moist and  mucous membranes are normal. No trismus in the jaw. No oropharyngeal exudate, posterior oropharyngeal erythema or tonsillar abscesses.  Eyes: Pupils are equal, round, and reactive to light. EOM are normal.  Neck: Normal range of motion. Neck supple. No tracheal deviation present.  Cardiovascular: S1 normal, S2 normal, normal heart sounds, intact distal pulses and normal pulses.  An irregularly irregular rhythm present. Tachycardia present.   Pulmonary/Chest: Effort normal and breath sounds normal. No respiratory distress. He has no decreased breath sounds. He has no wheezes. He has no rhonchi. He has no rales.  Abdominal: Normal appearance and bowel sounds are normal. There is no tenderness.  Musculoskeletal: Normal range of motion.  Neurological: He is alert and oriented to person, place, and time.  Skin: Skin is warm and dry.  Psychiatric: He has a normal mood and affect. His speech is normal and behavior is normal. Thought content normal.   ED Treatments / Results  Labs (all labs ordered are listed, but only abnormal results are displayed) Labs Reviewed  BASIC METABOLIC PANEL  CBC  MAGNESIUM  I-STAT TROPONIN, ED    EKG  EKG Interpretation None       Radiology Dg Chest Portable 1 View  Result Date: 05/07/2017 CLINICAL DATA:  Tachycardia.  Atrial fibrillation. EXAM: PORTABLE CHEST 1 VIEW COMPARISON:  None. FINDINGS: The heart size and mediastinal contours are within normal limits. Both lungs are clear. The visualized skeletal structures are unremarkable. IMPRESSION: No active disease. Electronically Signed   By: Dorise Bullion III M.D   On: 05/07/2017 20:36    Procedures .Cardioversion Date/Time: 05/07/2017 11:08 PM Performed by: Shary Decamp Authorized by: Shary Decamp   Consent:    Consent obtained:  Verbal   Consent given by:  Patient   Risks discussed:  Cutaneous burn, induced arrhythmia, death and pain   Alternatives discussed:  Rate-control medication Pre-procedure  details:    Cardioversion basis:  Elective   Rhythm:  Atrial fibrillation   Electrode placement:  Anterior-lateral Attempt one:    Cardioversion mode:  Synchronous   Shock (Joules):  120   Shock outcome:  Conversion to normal sinus rhythm Post-procedure details:    Patient status:  Alert   Patient tolerance of procedure:  Tolerated well, no immediate complications Comments:     NSR post cardioversion. PT awake alert afterwards. CN evaluated and unremarkable. No unilateral weakness   (including critical care time) CRITICAL CARE Performed by: Ozella Rocks   Total critical care time: 50 minutes  Critical care time was exclusive of separately billable procedures and treating other patients.  Critical care was necessary to treat or prevent imminent or life-threatening deterioration.  Critical care was time spent personally by me on the following activities: development of treatment plan with patient and/or surrogate as well as nursing, discussions with consultants, evaluation of patient's response to treatment, examination of patient, obtaining history from patient or surrogate, ordering  and performing treatments and interventions, ordering and review of laboratory studies, ordering and review of radiographic studies, pulse oximetry and re-evaluation of patient's condition.   Medications Ordered in ED Medications  ondansetron (ZOFRAN) 4 MG/2ML injection (not administered)  etomidate (AMIDATE) injection 15 mg (10 mg Intravenous Given 05/07/17 2205)     Initial Impression / Assessment and Plan / ED Course  I have reviewed the triage vital signs and the nursing notes.  Pertinent labs & imaging results that were available during my care of the patient were reviewed by me and considered in my medical decision making (see chart for details).  Final Clinical Impressions(s) / ED Diagnoses  {I have reviewed and evaluated the relevant laboratory values. {I have reviewed and evaluated the  relevant imaging studies. {I have interpreted the relevant EKG. {I have reviewed the relevant previous healthcare records.  {I obtained HPI from historian. {Patient discussed with supervising physician.  ED Course:  Assessment: Pt is a 56 y.o. male with a hx of Paroxysmal Atrial Fibrillation, presents to the Emergency Department today due to chest pressure. Pt states this occurred around 1930 before going to bed. He does note hx atrial fibrillation. Notes that he gets thrown into this rhythm when he has stress and anxiety. Pt is on Metoprolol. Takes ASA. Does not take anticoagulation. Notes nausea with diaphoresis. Radiation of pain into neck. No shortness of breath. Denies CP. No abdominal pain. No cough.congestion. No URI symptoms. On exam, pt in NAD. Nontoxic/nonseptic appearing. VS. With tachycardia. Normotensive. Afebrile. Lungs CTA. Heart RRR. Abdomen nontender soft. Trop negative. EKG shows Afib with RVR. CHADVASC of 0. Consult to Cardiology (Dr. Aundra Dubin). Pt Cardioverted in ED with success. Discussed risks and benefits prior to procedural sedation and cardioversion and pt agrees with risks. EKG shows NSR. Pt was within window for cardioversion. Ambulatory referral to Afib clinic. No anticoagulation indicated. At time of discharge, Patient is in no acute distress. Vital Signs are stable. Patient is able to ambulate. Patient able to tolerate PO.    Disposition/Plan:  DC Home Additional Verbal discharge instructions given and discussed with patient.  Pt Instructed to f/u with AFib Clinic in the next week for evaluation and treatment of symptoms. Return precautions given Pt acknowledges and agrees with plan  Supervising Physician Tegeler, Gwenyth Allegra, *  Final diagnoses:  Atrial fibrillation with RVR [facility])    New Prescriptions New Prescriptions   No medications on file         Shary Decamp, Hershal Coria 05/07/17 2310    Tegeler, Gwenyth Allegra, MD 05/10/17 1153

## 2017-05-08 ENCOUNTER — Telehealth (HOSPITAL_COMMUNITY): Payer: Self-pay | Admitting: *Deleted

## 2017-05-08 NOTE — Telephone Encounter (Signed)
Referral from ED for afib.  LMOM

## 2017-05-24 ENCOUNTER — Telehealth (HOSPITAL_COMMUNITY): Payer: Self-pay | Admitting: *Deleted

## 2017-05-24 NOTE — Telephone Encounter (Signed)
LMOM for pt to return call to sched appt since referred

## 2017-11-13 ENCOUNTER — Other Ambulatory Visit: Payer: Self-pay

## 2017-11-13 ENCOUNTER — Emergency Department (HOSPITAL_COMMUNITY)
Admission: EM | Admit: 2017-11-13 | Discharge: 2017-11-13 | Disposition: A | Discharge status: Home / Self Care | Payer: Commercial Managed Care - PPO | Attending: Emergency Medicine | Admitting: Emergency Medicine

## 2017-11-13 ENCOUNTER — Emergency Department (HOSPITAL_COMMUNITY): Payer: Commercial Managed Care - PPO

## 2017-11-13 ENCOUNTER — Encounter (HOSPITAL_COMMUNITY): Payer: Self-pay

## 2017-11-13 DIAGNOSIS — Z79899 Other long term (current) drug therapy: Secondary | ICD-10-CM | POA: Diagnosis not present

## 2017-11-13 DIAGNOSIS — N201 Calculus of ureter: Secondary | ICD-10-CM | POA: Insufficient documentation

## 2017-11-13 DIAGNOSIS — Z7982 Long term (current) use of aspirin: Secondary | ICD-10-CM | POA: Insufficient documentation

## 2017-11-13 DIAGNOSIS — R109 Unspecified abdominal pain: Secondary | ICD-10-CM | POA: Diagnosis present

## 2017-11-13 DIAGNOSIS — F17210 Nicotine dependence, cigarettes, uncomplicated: Secondary | ICD-10-CM | POA: Diagnosis not present

## 2017-11-13 LAB — CBC
HCT: 46.5 % (ref 39.0–52.0)
Hemoglobin: 15.7 g/dL (ref 13.0–17.0)
MCH: 32.9 pg (ref 26.0–34.0)
MCHC: 33.8 g/dL (ref 30.0–36.0)
MCV: 97.5 fL (ref 78.0–100.0)
Platelets: 194 10*3/uL (ref 150–400)
RBC: 4.77 MIL/uL (ref 4.22–5.81)
RDW: 12.2 % (ref 11.5–15.5)
WBC: 6.7 10*3/uL (ref 4.0–10.5)

## 2017-11-13 LAB — URINALYSIS, ROUTINE W REFLEX MICROSCOPIC
BACTERIA UA: NONE SEEN
BILIRUBIN URINE: NEGATIVE
Glucose, UA: NEGATIVE mg/dL
Ketones, ur: NEGATIVE mg/dL
LEUKOCYTES UA: NEGATIVE
Nitrite: NEGATIVE
PROTEIN: NEGATIVE mg/dL
SPECIFIC GRAVITY, URINE: 1.015 (ref 1.005–1.030)
SQUAMOUS EPITHELIAL / LPF: NONE SEEN
pH: 5 (ref 5.0–8.0)

## 2017-11-13 LAB — BASIC METABOLIC PANEL WITH GFR
Anion gap: 11 (ref 5–15)
BUN: 16 mg/dL (ref 6–20)
CO2: 23 mmol/L (ref 22–32)
Calcium: 9.4 mg/dL (ref 8.9–10.3)
Chloride: 103 mmol/L (ref 101–111)
Creatinine, Ser: 0.97 mg/dL (ref 0.61–1.24)
GFR calc Af Amer: >60 mL/min
GFR calc non Af Amer: >60 mL/min
Glucose, Bld: 103 mg/dL — ABNORMAL HIGH (ref 65–99)
Potassium: 4.7 mmol/L (ref 3.5–5.1)
Sodium: 137 mmol/L (ref 135–145)

## 2017-11-13 MED ORDER — HYDROMORPHONE HCL 1 MG/ML IJ SOLN
0.5000 mg | INTRAMUSCULAR | Status: DC | PRN
  Administered 2017-11-13 (×2): 0.5 mg via INTRAVENOUS
  Filled 2017-11-13 (×2): qty 1

## 2017-11-13 MED ORDER — ONDANSETRON 4 MG PO TBDP: 4.0000 mg | ORAL_TABLET | Freq: Three times a day (TID) | ORAL | 0 refills | Status: DC | PRN

## 2017-11-13 MED ORDER — MORPHINE SULFATE (PF) 4 MG/ML IV SOLN
4.0000 mg | Freq: Once | INTRAVENOUS | Status: AC
  Administered 2017-11-13: 4 mg via INTRAVENOUS
  Filled 2017-11-13: qty 1

## 2017-11-13 MED ORDER — MORPHINE SULFATE (PF) 4 MG/ML IV SOLN
6.0000 mg | Freq: Once | INTRAVENOUS | Status: AC
  Administered 2017-11-13: 6 mg via INTRAVENOUS
  Filled 2017-11-13: qty 2

## 2017-11-13 MED ORDER — ONDANSETRON HCL 4 MG/2ML IJ SOLN: 4.0000 mg | Freq: Once | INTRAMUSCULAR | Status: DC

## 2017-11-13 MED ORDER — ONDANSETRON 4 MG PO TBDP
4.0000 mg | ORAL_TABLET | Freq: Once | ORAL | Status: AC
  Administered 2017-11-13: 4 mg via ORAL
  Filled 2017-11-13: qty 1

## 2017-11-13 MED ORDER — TAMSULOSIN HCL 0.4 MG PO CAPS: 0.4000 mg | ORAL_CAPSULE | Freq: Every day | ORAL | 0 refills | Status: DC

## 2017-11-13 MED ORDER — OXYCODONE-ACETAMINOPHEN 5-325 MG PO TABS: 1.0000 | ORAL_TABLET | Freq: Four times a day (QID) | ORAL | 0 refills | Status: DC | PRN

## 2017-11-13 MED ORDER — OXYCODONE-ACETAMINOPHEN 5-325 MG PO TABS
1.0000 | ORAL_TABLET | ORAL | Status: DC | PRN
  Administered 2017-11-13: 1 via ORAL
  Filled 2017-11-13: qty 1

## 2017-11-13 MED ORDER — KETOROLAC TROMETHAMINE 30 MG/ML IJ SOLN
15.0000 mg | Freq: Once | INTRAMUSCULAR | Status: AC
  Administered 2017-11-13: 15 mg via INTRAVENOUS
  Filled 2017-11-13: qty 1

## 2017-11-13 MED ORDER — ONDANSETRON HCL 4 MG/2ML IJ SOLN
4.0000 mg | Freq: Once | INTRAMUSCULAR | Status: AC
  Administered 2017-11-13: 4 mg via INTRAVENOUS
  Filled 2017-11-13: qty 2

## 2017-11-13 MED ORDER — IBUPROFEN 600 MG PO TABS: 600.0000 mg | ORAL_TABLET | Freq: Four times a day (QID) | ORAL | 0 refills | Status: DC | PRN

## 2017-11-13 MED ORDER — FENTANYL CITRATE (PF) 100 MCG/2ML IJ SOLN
100.0000 ug | Freq: Once | INTRAMUSCULAR | Status: AC
  Administered 2017-11-13: 100 ug via INTRAVENOUS
  Filled 2017-11-13: qty 2

## 2017-11-13 MED ORDER — OXYCODONE-ACETAMINOPHEN 5-325 MG PO TABS
2.0000 | ORAL_TABLET | Freq: Once | ORAL | Status: AC
  Administered 2017-11-13: 2 via ORAL
  Filled 2017-11-13: qty 2

## 2017-11-13 NOTE — Discharge Instructions (Addendum)
Medications: Percocet, ibuprofen, Zofran, Flomax  Treatment: Take 1-2 Percocet every 4-6 hours as needed for severe pain.  You can alternate with ibuprofen every 6 hours as prescribed.  Take Zofran every 8 hours as needed for nausea or vomiting.  Take Flomax once daily after dinner.  Do not drink alcohol, drive, operate machinery or participate in any other potentially dangerous activities while taking opiate pain medication as it may make you sleepy. Do not take this medication with any other sedating medications, either prescription or over-the-counter. If you were prescribed Percocet or Vicodin, do not take these with acetaminophen (Tylenol) as it is already contained within these medications and overdose of Tylenol is dangerous.   This medication is an opiate (or narcotic) pain medication and can be habit forming.  Use it as little as possible to achieve adequate pain control.  Do not use or use it with extreme caution if you have a history of opiate abuse or dependence. This medication is intended for your use only - do not give any to anyone else and keep it in a secure place where nobody else, especially children, have access to it. It will also cause or worsen constipation, so you may want to consider taking an over-the-counter stool softener while you are taking this medication.  Follow-up: Please follow-up with your urologist for further evaluation and treatment.  Please return to the emergency department if you develop any new or worsening symptoms including fevers, inability to urinate, or any other new or concerning symptom.

## 2017-11-13 NOTE — ED Provider Notes (Signed)
[place] EMERGENCY DEPARTMENT Provider Note   CSN: [id number] Arrival date & time: 11/13/17  0736     History   Chief Complaint Chief Complaint  Patient presents with  . Flank Pain    HPI Anthony Roach is a 57 y.o. male.  Patient with history of kidney stones presents with acute onset of right-sided flank pain with associated nausea and vomiting starting last night.  Patient states that he has been trying to drink water at home to help his symptoms.  History of stones requiring stenting.  No other abdominal pain.  No change in urination.  Follows with Dr. Karsten Ro.  No dysuria or fever.  Patient states that the pain he feels now is similar to previous episodes. The onset of this condition was acute. The course is constant. Aggravating factors: none. Alleviating factors: none.       Past Medical History:  Diagnosis Date  . Anxiety   . Dysrhythmia    A Fib/Paroxysmal Atrial Fibrilation  . Renal disorder    kidney stones  . Stevens-Johnson syndrome ([place]) 2005    There are no active problems to display for this patient.   Past Surgical History:  Procedure Laterality Date  . Cysto, stents    . CYSTOSCOPY W/ URETERAL STENT PLACEMENT Bilateral 03/06/2016   Procedure: CYSTOSCOPY WITH BILATERAL RETROGRADE PYELOGRAM/BILATERAL URETEROSCOPY AND BILATERAL LASER APPLICATION, BILATERAL URETERAL STENT PLACEMENT;  Surgeon: Alexis Frock, MD;  Location: [facility];  Service: Urology;  Laterality: Bilateral;  . Rotator Cuff Repair Left Shoulder          Home Medications    Prior to Admission medications   Medication Sig Start Date End Date Taking? Authorizing Provider  aspirin EC 81 MG tablet Take 81 mg by mouth daily.   Yes [provider]  escitalopram (LEXAPRO) 20 MG tablet Take 20 mg by mouth daily.   Yes [provider]  metoprolol tartrate (LOPRESSOR) 25 MG tablet Take 25 mg by mouth daily.    Yes [provider]  pravastatin  (PRAVACHOL) 20 MG tablet Take 1 tablet by mouth daily. 08/27/17  Yes [provider]    Family History No family history on file.  Social History Social History   Tobacco Use  . Smoking status: Current Every Day Smoker  . Smokeless tobacco: Never Used  Substance Use Topics  . Alcohol use: Yes    Comment: occ  . Drug use: No     Allergies   Patient has no known allergies.   Review of Systems Review of Systems  Constitutional: Negative for fever.  HENT: Negative for rhinorrhea and sore throat.   Eyes: Negative for redness.  Respiratory: Negative for cough.   Cardiovascular: Negative for chest pain.  Gastrointestinal: Positive for nausea and vomiting. Negative for abdominal pain and diarrhea.  Genitourinary: Positive for flank pain. Negative for dysuria and hematuria.  Musculoskeletal: Negative for myalgias.  Skin: Negative for rash.  Neurological: Negative for headaches.     Physical Exam Updated Vital Signs BP 130/86 (BP Location: Right Arm)   Pulse 70   Temp 98.3 F (36.8 C) (Oral)   Resp 18   Ht 5\' 9"  (1.753 m)   Wt 106.6 kg (235 lb)   SpO2 100%   BMI 34.70 kg/m   Physical Exam  Constitutional: He appears well-developed and well-nourished. He appears distressed (Pacing and squating in the room).  HENT:  Head: Normocephalic and atraumatic.  Mouth/Throat: Oropharynx is clear and moist.  Eyes:  Conjunctivae are normal. Right eye exhibits no discharge. Left eye exhibits no discharge.  Neck: Normal range of motion. Neck supple.  Cardiovascular: Normal rate, regular rhythm and normal heart sounds.  No murmur heard. Pulmonary/Chest: Effort normal and breath sounds normal. No stridor. No respiratory distress. He has no wheezes.  Abdominal: Soft. There is no tenderness. There is CVA tenderness. There is no rebound and no guarding.  Neurological: He is alert.  Skin: Skin is warm and dry.  Psychiatric: He has a normal mood and affect.  Nursing note and  vitals reviewed.    ED Treatments / Results  Labs (all labs ordered are listed, but only abnormal results are displayed) Labs Reviewed  URINALYSIS, ROUTINE W REFLEX MICROSCOPIC - Abnormal; Notable for the following components:      Result Value   Hgb urine dipstick LARGE (*)    All other components within normal limits  BASIC METABOLIC PANEL - Abnormal; Notable for the following components:   Glucose, Bld 103 (*)    All other components within normal limits  CBC    EKG None  Radiology US Renal  Result Date: 11/13/2017 CLINICAL DATA:  Right flank pain and nausea for 1 day. History of nephrolithiasis. EXAM: RENAL / URINARY TRACT ULTRASOUND COMPLETE COMPARISON:  03/04/2016 CT abdomen/pelvis. FINDINGS: Right Kidney: Length: 12.1 cm. Renal parenchymal echogenicity and thickness are within normal limits. No right hydronephrosis. No right renal mass. No stones large enough to cause acoustic shadowing. Left Kidney: Length: 13.2 cm. Renal parenchymal echogenicity and thickness are within normal limits. No left hydronephrosis. Simple 0.7 cm lower left renal cyst. No additional left renal lesions. No stones large enough to cause acoustic shadowing. Bladder: Appears normal for degree of bladder distention. IMPRESSION: 1. No hydronephrosis. 2. Simple subcentimeter lower left renal cyst. Otherwise normal kidneys. 3. Normal bladder. Electronically Signed   By: Ilona Sorrel M.D.   On: 11/13/2017 12:10   Ct Renal Stone Study  Result Date: 11/13/2017 CLINICAL DATA:  RIGHT flank pain for 2-3 days. EXAM: CT ABDOMEN AND PELVIS WITHOUT CONTRAST TECHNIQUE: Multidetector CT imaging of the abdomen and pelvis was performed following the standard protocol without IV contrast. COMPARISON:  03/04/2016. FINDINGS: Lower chest: No acute abnormality. Hepatobiliary: No focal liver abnormality is seen. No gallstones, gallbladder wall thickening, or biliary dilatation. Pancreas: Unremarkable. No pancreatic ductal dilatation  or surrounding inflammatory changes. Spleen: Normal in size without focal abnormality. Adrenals/Urinary Tract: Multiple LEFT renal calculi without hydronephrosis. Lower pole calculus measures up to 8 mm long axis. Multiple RIGHT renal calculi with hydronephrosis. RIGHT lower pole calculus measures 6 mm long axis. Partially obstructing stone distal RIGHT ureter, 4 mm in size. Stomach/Bowel: No obstruction or pericecal inflammation. Vascular/Lymphatic: Aortic atherosclerosis. No enlarged abdominal or pelvic lymph nodes. Reproductive: Prostate is unremarkable. Other: Small   fat containing umbilical hernia. Musculoskeletal: Lower lumbar facet arthropathy. IMPRESSION: RIGHT hydronephrosis and hydroureter secondary to a partially obstructing 5 mm distal ureteral calculus. BILATERAL nephrolithiasis. Aortic Atherosclerosis (ICD10-I70.0). Electronically Signed   By: Staci Righter M.D.   On: 11/13/2017 14:30    Procedures Procedures (including critical care time)  Medications Ordered in ED Medications  ondansetron (ZOFRAN-ODT) disintegrating tablet 4 mg (4 mg Oral Given 11/13/17 0802)  ketorolac (TORADOL) 30 MG/ML injection 15 mg (15 mg Intravenous Given 11/13/17 1010)  fentaNYL (SUBLIMAZE) injection 100 mcg (100 mcg Intravenous Given 11/13/17 1046)  ketorolac (TORADOL) 30 MG/ML injection 15 mg (15 mg Intravenous Given 11/13/17 1046)  ondansetron (ZOFRAN) injection 4 mg (4 mg  Intravenous Given 11/13/17 1114)  morphine 4 MG/ML injection 6 mg (6 mg Intravenous Given 11/13/17 1322)  oxyCODONE-acetaminophen (PERCOCET/ROXICET) 5-325 MG per tablet 2 tablet (2 tablets Oral Given 11/13/17 1532)  morphine 4 MG/ML injection 4 mg (4 mg Intravenous Given 11/13/17 1532)     Initial Impression / Assessment and Plan / ED Course  I have reviewed the triage vital signs and the nursing notes.  Pertinent labs & imaging results that were available during my care of the patient were reviewed by me and considered in my medical  decision making (see chart for details).     Patient seen and examined. Work-up initiated. Medications ordered. UA c/w stone, no signs of infection.   Vital signs reviewed and are as follows: BP 130/86 (BP Location: Right Arm)   Pulse 70   Temp 98.3 F (36.8 C) (Oral)   Resp 18   Ht 5\' 9"  (1.753 m)   Wt 106.6 kg (235 lb)   SpO2 100%   BMI 34.70 kg/m   Patient required multiple doses of pain medication: toradol x 2, fentanyl x 1, dilaudid x 2.   [place] ordered -- no acute findings or hydro.   Given intractable pain, now worse per patient, CT ordered.   CT demonstrates 80mm distal ureteral stone. Patient was improved with morphine, pain now returning. Attempting to transition to PO pain medications. If able, can go home. If this fails will need urology input.   4:11 PM Handoff to Law PA-C at shift change. Will monitor for an hour and discharge if improving.   Patient counseled on kidney stone treatment. Urged patient to strain urine and save any stones. Urged urology follow-up and return to [facility] with any complications. Counseled patient to maintain good fluid intake.   Counseled patient on use of Flomax.   Patient counseled on use of narcotic pain medications. Counseled not to combine these medications with others containing tylenol. Urged not to drink alcohol, drive, or perform any other activities that requires focus while taking these medications. The patient verbalizes understanding and agrees with the plan.   Final Clinical Impressions(s) / ED Diagnoses   Final diagnoses:  Right ureteral stone   Pt with 80mm R distal ureteral stone. Pain control has been problematic here, but patient now improving. No UTI. Normal kidney function.   ED Discharge Orders    None       Carlisle Cater, Hershal Coria 11/13/17 1721    Pattricia Boss, MD 11/14/17 [phone]

## 2017-11-13 NOTE — ED Notes (Signed)
Pt taken to CT.

## 2017-11-13 NOTE — ED Provider Notes (Signed)
Signout at shift change from [place], [place] See previous providers note for full H&P  Patient with history of kidney stones presenting with right flank pain.  CT renal stone study shows right hydronephrosis and hydroureter secondary to partially obstructing 5 mm distal ureteral calculus.  Patient's pain very difficult to control in the ED, although most recently got 2 Percocet and had some relief.  Plan to observe another hour to make sure no return of pain.  Plan to discharge home with pain control, Flomax, Zofran and follow-up to patient's urologist.  On my reevaluation, patient is lying on the bed comfortably.  He states that pain is much improved, however he has had nausea.  He reports he has had nausea with taking Percocet in the past.  Will give another dose of IV Zofran prior to discharge and discharged home with Percocet, Flomax, Zofran, and ibuprofen.  Patient understands and agrees with plan.  Patient vitals stable and discharged in satisfactory condition.    Frederica Kuster, PA-C 11/13/17 1721    Pattricia Boss, MD 11/14/17 [phone]

## 2017-11-13 NOTE — ED Triage Notes (Signed)
Patient complains of right flank pain with nausea x 1 day. frequent stones with same pain per patient

## 2017-11-13 NOTE — ED Notes (Signed)
Patient transported to Ultrasound 

## 2017-11-17 ENCOUNTER — Other Ambulatory Visit: Payer: Self-pay

## 2017-11-17 ENCOUNTER — Emergency Department (HOSPITAL_COMMUNITY)
Admission: EM | Admit: 2017-11-17 | Discharge: 2017-11-17 | Disposition: A | Discharge status: Home / Self Care | Payer: Commercial Managed Care - PPO | Attending: Emergency Medicine | Admitting: Emergency Medicine

## 2017-11-17 ENCOUNTER — Encounter (HOSPITAL_COMMUNITY): Payer: Self-pay

## 2017-11-17 DIAGNOSIS — Z7982 Long term (current) use of aspirin: Secondary | ICD-10-CM | POA: Diagnosis not present

## 2017-11-17 DIAGNOSIS — F17200 Nicotine dependence, unspecified, uncomplicated: Secondary | ICD-10-CM | POA: Insufficient documentation

## 2017-11-17 DIAGNOSIS — N200 Calculus of kidney: Secondary | ICD-10-CM | POA: Insufficient documentation

## 2017-11-17 DIAGNOSIS — Z7902 Long term (current) use of antithrombotics/antiplatelets: Secondary | ICD-10-CM | POA: Insufficient documentation

## 2017-11-17 DIAGNOSIS — Z79899 Other long term (current) drug therapy: Secondary | ICD-10-CM | POA: Insufficient documentation

## 2017-11-17 DIAGNOSIS — R1011 Right upper quadrant pain: Secondary | ICD-10-CM | POA: Diagnosis present

## 2017-11-17 MED ORDER — OXYCODONE-ACETAMINOPHEN 5-325 MG PO TABS: 1.0000 | ORAL_TABLET | Freq: Four times a day (QID) | ORAL | 0 refills | Status: DC | PRN

## 2017-11-17 MED ORDER — KETOROLAC TROMETHAMINE 30 MG/ML IJ SOLN
30.0000 mg | Freq: Once | INTRAMUSCULAR | Status: AC
  Administered 2017-11-17: 30 mg via INTRAMUSCULAR
  Filled 2017-11-17: qty 1

## 2017-11-17 NOTE — ED Provider Notes (Signed)
[facility] EMERGENCY DEPARTMENT Provider Note   CSN: [id number] Arrival date & time: 11/17/17  0847     History   Chief Complaint Chief Complaint  Patient presents with  . Medication Refill    HPI Anthony Roach is a 57 y.o. male.  HPI   57 year old male with history of kidney stone presenting requesting for pain management of his current stone.  Patient was seen 4 days ago for right flank pain.  CT scan demonstrated a 5 mm partially obstructing stone at the distal ureter.  Patient was requiring a large amount of pain medication during the hospitalization for pain relief.  He was subsequently discharged home.  He did reach out to [facility] urology and does have an appointment tomorrow.  He is ran out of his pain medication and the pain is still severe.  Described pain as a sharp shooting colicky pain similar to prior.  Pain is not changed in location.  No fever nausea or vomiting.  He did reach out to the on-call urologist who recommend patient to come to the ER for pain control and he will be seen tomorrow in the office.  Denies having fever, or difficulty urinating.  Past Medical History:  Diagnosis Date  . Anxiety   . Dysrhythmia    A Fib/Paroxysmal Atrial Fibrilation  . Renal disorder    kidney stones  . Stevens-Johnson syndrome ([place]) 2005    There are no active problems to display for this patient.   Past Surgical History:  Procedure Laterality Date  . Cysto, stents    . CYSTOSCOPY W/ URETERAL STENT PLACEMENT Bilateral 03/06/2016   Procedure: CYSTOSCOPY WITH BILATERAL RETROGRADE PYELOGRAM/BILATERAL URETEROSCOPY AND BILATERAL LASER APPLICATION, BILATERAL URETERAL STENT PLACEMENT;  Surgeon: Alexis Frock, MD;  Location: [facility];  Service: Urology;  Laterality: Bilateral;  . Rotator Cuff Repair Left Shoulder          Home Medications    Prior to Admission medications   Medication Sig Start Date End Date Taking? Authorizing Provider  aspirin  EC 81 MG tablet Take 81 mg by mouth daily.    [provider]  escitalopram (LEXAPRO) 20 MG tablet Take 20 mg by mouth daily.    [provider]  ibuprofen (ADVIL,MOTRIN) 600 MG tablet Take 1 tablet (600 mg total) by mouth every 6 (six) hours as needed. 11/13/17   Law, Bea Graff, PA-C  metoprolol tartrate (LOPRESSOR) 25 MG tablet Take 25 mg by mouth daily.     [provider]  ondansetron (ZOFRAN ODT) 4 MG disintegrating tablet Take 1 tablet (4 mg total) by mouth every 8 (eight) hours as needed for nausea or vomiting. 11/13/17   Law, Bea Graff, PA-C  oxyCODONE-acetaminophen (PERCOCET/ROXICET) 5-325 MG tablet Take 1-2 tablets by mouth every 6 (six) hours as needed for severe pain. 11/13/17   Law, Bea Graff, PA-C  pravastatin (PRAVACHOL) 20 MG tablet Take 1 tablet by mouth daily. 08/27/17   [provider]  tamsulosin (FLOMAX) 0.4 MG CAPS capsule Take 1 capsule (0.4 mg total) by mouth daily after supper. 11/13/17   Frederica Kuster, PA-C    Family History No family history on file.  Social History Social History   Tobacco Use  . Smoking status: Current Every Day Smoker  . Smokeless tobacco: Never Used  Substance Use Topics  . Alcohol use: Yes    Comment: occ  . Drug use: No     Allergies   Patient has no known allergies.  Review of Systems Review of Systems  Constitutional: Negative for fever.  Genitourinary: Positive for flank pain. Negative for dysuria.     Physical Exam Updated Vital Signs BP 128/80   Pulse 68   Temp 98 F (36.7 C) (Oral)   Resp 18   SpO2 99%   Physical Exam  Constitutional: He appears well-developed and well-nourished. No distress.  HENT:  Head: Atraumatic.  Eyes: Conjunctivae are normal.  Neck: Neck supple.  Cardiovascular: Normal rate and regular rhythm.  Pulmonary/Chest: Effort normal and breath sounds normal.  Abdominal: Soft.  Genitourinary:  Genitourinary Comments: Right CVA tenderness on percussion.   Neurological: He is alert.  Skin: No rash noted.  Psychiatric: He has a normal mood and affect.  Nursing note and vitals reviewed.    ED Treatments / Results  Labs (all labs ordered are listed, but only abnormal results are displayed) Labs Reviewed - No data to display  EKG None  Radiology No results found.  Procedures Procedures (including critical care time)  Medications Ordered in ED Medications  ketorolac (TORADOL) 30 MG/ML injection 30 mg (has no administration in time range)     Initial Impression / Assessment and Plan / ED Course  I have reviewed the triage vital signs and the nursing notes.  Pertinent labs & imaging results that were available during my care of the patient were reviewed by me and considered in my medical decision making (see chart for details).     BP 128/80   Pulse 68   Temp 98 F (36.7 C) (Oral)   Resp 18   SpO2 99%    Final Clinical Impressions(s) / ED Diagnoses   Final diagnoses:  Kidney stone on right side    ED Discharge Orders        Ordered    oxyCODONE-acetaminophen (PERCOCET/ROXICET) 5-325 MG tablet  Every 6 hours PRN     11/17/17 1046     10:36 AM Patient recently diagnosed with having a partially obstructive 5 mm stone involving the right ureter.  He does have an appointment with urology tomorrow however pain is not adequately controlled currently.  He does appears uncomfortable but stable.  At this time, I will give patient a short course of pain medication to last for today and he will follow-up tomorrow in the office for further care.  Patient otherwise stable for discharge. In order to decrease risk of narcotic abuse. Pt's record were checked using the [place] Controlled Substance database.     Domenic Moras, PA-C 11/17/17 [place], MD 11/17/17 [phone]

## 2017-11-17 NOTE — ED Triage Notes (Signed)
Patient here for medication refill. Seen earlier in week for kidney stone and having ongoing pain. Sent by urologist for meds

## 2019-03-11 DIAGNOSIS — R519 Headache, unspecified: Secondary | ICD-10-CM | POA: Insufficient documentation

## 2020-01-08 DIAGNOSIS — M2242 Chondromalacia patellae, left knee: Secondary | ICD-10-CM | POA: Insufficient documentation

## 2020-04-14 ENCOUNTER — Other Ambulatory Visit: Payer: Self-pay

## 2020-04-14 ENCOUNTER — Emergency Department (HOSPITAL_COMMUNITY)
Admission: EM | Admit: 2020-04-14 | Discharge: 2020-04-14 | Disposition: A | Discharge status: Home / Self Care | Payer: 59 | Attending: Emergency Medicine | Admitting: Emergency Medicine

## 2020-04-14 ENCOUNTER — Encounter (HOSPITAL_COMMUNITY): Payer: Self-pay

## 2020-04-14 ENCOUNTER — Telehealth (HOSPITAL_COMMUNITY): Payer: Self-pay

## 2020-04-14 DIAGNOSIS — Z7901 Long term (current) use of anticoagulants: Secondary | ICD-10-CM | POA: Diagnosis not present

## 2020-04-14 DIAGNOSIS — I482 Chronic atrial fibrillation, unspecified: Secondary | ICD-10-CM | POA: Insufficient documentation

## 2020-04-14 DIAGNOSIS — Z79899 Other long term (current) drug therapy: Secondary | ICD-10-CM | POA: Diagnosis not present

## 2020-04-14 DIAGNOSIS — I4891 Unspecified atrial fibrillation: Secondary | ICD-10-CM

## 2020-04-14 DIAGNOSIS — Z7982 Long term (current) use of aspirin: Secondary | ICD-10-CM | POA: Insufficient documentation

## 2020-04-14 DIAGNOSIS — R002 Palpitations: Secondary | ICD-10-CM | POA: Diagnosis present

## 2020-04-14 HISTORY — DX: Unspecified atrial fibrillation: I48.91

## 2020-04-14 LAB — CBC WITH DIFFERENTIAL/PLATELET
Abs Immature Granulocytes: 0.03 10*3/uL (ref 0.00–0.07)
Basophils Absolute: 0.1 10*3/uL (ref 0.0–0.1)
Basophils Relative: 1 %
Eosinophils Absolute: 0.3 10*3/uL (ref 0.0–0.5)
Eosinophils Relative: 5 %
HCT: 47.4 % (ref 39.0–52.0)
Hemoglobin: 15.9 g/dL (ref 13.0–17.0)
Immature Granulocytes: 0 %
Lymphocytes Relative: 33 %
Lymphs Abs: 2.4 10*3/uL (ref 0.7–4.0)
MCH: 32 pg (ref 26.0–34.0)
MCHC: 33.5 g/dL (ref 30.0–36.0)
MCV: 95.4 fL (ref 80.0–100.0)
Monocytes Absolute: 0.7 10*3/uL (ref 0.1–1.0)
Monocytes Relative: 9 %
Neutro Abs: 3.7 10*3/uL (ref 1.7–7.7)
Neutrophils Relative %: 52 %
Platelets: 203 10*3/uL (ref 150–400)
RBC: 4.97 MIL/uL (ref 4.22–5.81)
RDW: 12 % (ref 11.5–15.5)
WBC: 7.2 10*3/uL (ref 4.0–10.5)
nRBC: 0 % (ref 0.0–0.2)

## 2020-04-14 LAB — BASIC METABOLIC PANEL
Anion gap: 11 (ref 5–15)
BUN: 19 mg/dL (ref 6–20)
CO2: 21 mmol/L — ABNORMAL LOW (ref 22–32)
Calcium: 8.9 mg/dL (ref 8.9–10.3)
Chloride: 107 mmol/L (ref 98–111)
Creatinine, Ser: 1.16 mg/dL (ref 0.61–1.24)
GFR calc Af Amer: >60 mL/min (ref >60)
GFR calc non Af Amer: >60 mL/min (ref >60)
Glucose, Bld: 111 mg/dL — ABNORMAL HIGH (ref 70–99)
Potassium: 4.1 mmol/L (ref 3.5–5.1)
Sodium: 139 mmol/L (ref 135–145)

## 2020-04-14 LAB — MAGNESIUM: Magnesium: 1.8 mg/dL (ref 1.7–2.4)

## 2020-04-14 MED ORDER — METOPROLOL TARTRATE 5 MG/5ML IV SOLN
5.0000 mg | Freq: Once | INTRAVENOUS | Status: AC
  Administered 2020-04-14: 09:00:00 5 mg via INTRAVENOUS

## 2020-04-14 MED ORDER — APIXABAN (ELIQUIS) EDUCATION KIT FOR DVT/PE PATIENTS
PACK | Freq: Once | Status: AC
  Filled 2020-04-14: qty 1

## 2020-04-14 MED ORDER — METOPROLOL TARTRATE 5 MG/5ML IV SOLN
10.0000 mg | Freq: Once | INTRAVENOUS | Status: DC
  Filled 2020-04-14: qty 10

## 2020-04-14 MED ORDER — SODIUM CHLORIDE 0.9 % IV BOLUS
1000.0000 mL | Freq: Once | INTRAVENOUS | Status: AC
  Administered 2020-04-14: 09:00:00 1000 mL via INTRAVENOUS

## 2020-04-14 MED ORDER — METOPROLOL TARTRATE 5 MG/5ML IV SOLN: 5.0000 mg | Freq: Once | INTRAVENOUS | Status: DC

## 2020-04-14 MED ORDER — APIXABAN 5 MG PO TABS: 5.0000 mg | ORAL_TABLET | Freq: Two times a day (BID) | ORAL | 0 refills | Status: DC

## 2020-04-14 MED ORDER — APIXABAN 5 MG PO TABS
5.0000 mg | ORAL_TABLET | Freq: Two times a day (BID) | ORAL | Status: DC
  Administered 2020-04-14: 5 mg via ORAL
  Filled 2020-04-14: qty 1

## 2020-04-14 MED ORDER — PROPOFOL 10 MG/ML IV BOLUS
INTRAVENOUS | Status: AC | PRN
  Administered 2020-04-14: 100 ug via INTRAVENOUS

## 2020-04-14 MED ORDER — PROPOFOL 10 MG/ML IV BOLUS
0.5000 mg/kg | Freq: Once | INTRAVENOUS | Status: DC
  Filled 2020-04-14: qty 20

## 2020-04-14 NOTE — ED Notes (Signed)
Patient verbalizes understanding of discharge instructions. Opportunity for questioning and answers were provided. Pt discharged from ED. 

## 2020-04-14 NOTE — Telephone Encounter (Signed)
Patient is on the ED report. Reached out to patient to see if he would like to schedule a follow-up appointment. Left voicemail with telephone number.

## 2020-04-14 NOTE — Progress Notes (Signed)
ANTICOAGULATION CONSULT NOTE - Initial Consult  Pharmacy Consult for Apixaban Indication: atrial fibrillation  Allergies  Allergen Reactions  . Bee Pollen   . Dust Mite Extract     Patient Measurements: Height: 5\' 10"  (177.8 cm) Weight: 111.1 kg (245 lb) IBW/kg (Calculated) : 73  Vital Signs: Temp: 98.2 F (36.8 C) (08/26 0733) Temp Source: Oral (08/26 0733) BP: 98/74 (08/26 0937) Pulse Rate: 69 (08/26 0937)  Labs: Recent Labs    04/14/20 0800  HGB 15.9  HCT 47.4  PLT 203  CREATININE 1.16    Estimated Creatinine Clearance: 86.6 mL/min (by C-G formula based on SCr of 1.16 mg/dL).   Medical History: Past Medical History:  Diagnosis Date  . A-fib ([place])   . Anxiety   . Dysrhythmia    A Fib/Paroxysmal Atrial Fibrilation  . Renal disorder    kidney stones  . Stevens-Johnson syndrome ([place]) 2005    Medications:  (Not in a hospital admission)   Assessment: 69 YOM with new onset Afib now back in NSR. Pharmacy consulted to start apixaban for primary stroke prevention. H/H and Plt wnl. SCr wnl   Goal of Therapy:  Primary stroke prevention Monitor platelets by anticoagulation protocol: Yes   Plan:  -Start apixaban 5 mg twice daily -Monitor closely for bleeding -Will educate patient prior to discharge   Albertina Parr, PharmD., BCPS, BCCCP Clinical Pharmacist Clinical phone for 04/14/20 until 3:30pm: [phone] If after 3:30pm, please refer to [facility] for unit-specific pharmacist

## 2020-04-14 NOTE — ED Provider Notes (Signed)
[facility] EMERGENCY DEPARTMENT Provider Note   CSN: [id number] Arrival date & time: 04/14/20  9169     History Chief Complaint  Patient presents with  . Atrial Fibrillation    Anthony Roach is a 59 y.o. male.  59 yo M with a chief complaints of palpitations and a pressure in his neck.  Has been going on since yesterday evening.  He has been working night shifts and drank a caffeinated beverage and was on his way to work when he started feeling the symptoms.  Has had this happen to him in the past.  Consistent with his episodes of atrial fibrillation.  Started suddenly yesterday.  Not having off and on in the past week.  He denies recent illness denies cough congestion or fever denies nausea vomiting or diarrhea denies any other stimulants.  The history is provided by the patient.  Atrial Fibrillation This is a recurrent problem. The current episode started yesterday. The problem occurs constantly. The problem has not changed since onset.Associated symptoms include chest pain. Pertinent negatives include no abdominal pain, no headaches and no shortness of breath. Nothing aggravates the symptoms. Nothing relieves the symptoms. He has tried nothing for the symptoms. The treatment provided no relief.       Past Medical History:  Diagnosis Date  . A-fib ([place])   . Anxiety   . Dysrhythmia    A Fib/Paroxysmal Atrial Fibrilation  . Renal disorder    kidney stones  . Stevens-Johnson syndrome ([place]) 2005    There are no problems to display for this patient.   Past Surgical History:  Procedure Laterality Date  . Cysto, stents    . CYSTOSCOPY W/ URETERAL STENT PLACEMENT Bilateral 03/06/2016   Procedure: CYSTOSCOPY WITH BILATERAL RETROGRADE PYELOGRAM/BILATERAL URETEROSCOPY AND BILATERAL LASER APPLICATION, BILATERAL URETERAL STENT PLACEMENT;  Surgeon: Alexis Frock, MD;  Location: [facility];  Service: Urology;  Laterality: Bilateral;  . Rotator Cuff Repair Left  Shoulder         No family history on file.  Social History   Tobacco Use  . Smoking status: Former Research scientist (life sciences)  . Smokeless tobacco: Never Used  Substance Use Topics  . Alcohol use: Yes    Comment: occ  . Drug use: No    Home Medications Prior to Admission medications   Medication Sig Start Date End Date Taking? Authorizing Provider  apixaban (ELIQUIS) 5 MG TABS tablet Take 1 tablet (5 mg total) by mouth 2 (two) times daily. 04/14/20 05/14/20  Deno Etienne, DO  aspirin EC 81 MG tablet Take 81 mg by mouth daily.    [provider]  escitalopram (LEXAPRO) 20 MG tablet Take 20 mg by mouth daily.    [provider]  ibuprofen (ADVIL,MOTRIN) 600 MG tablet Take 1 tablet (600 mg total) by mouth every 6 (six) hours as needed. 11/13/17   Law, Bea Graff, PA-C  metoprolol tartrate (LOPRESSOR) 25 MG tablet Take 25 mg by mouth daily.     [provider]  ondansetron (ZOFRAN ODT) 4 MG disintegrating tablet Take 1 tablet (4 mg total) by mouth every 8 (eight) hours as needed for nausea or vomiting. 11/13/17   Law, Bea Graff, PA-C  oxyCODONE-acetaminophen (PERCOCET/ROXICET) 5-325 MG tablet Take 1-2 tablets by mouth every 6 (six) hours as needed for severe pain. 11/17/17   Domenic Moras, PA-C  pravastatin (PRAVACHOL) 20 MG tablet Take 1 tablet by mouth daily. 08/27/17   [provider]  tamsulosin (FLOMAX) 0.4 MG CAPS capsule Take  1 capsule (0.4 mg total) by mouth daily after supper. 11/13/17   Law, Bea Graff, PA-C    Allergies    Bee pollen and Dust mite extract  Review of Systems   Review of Systems  Constitutional: Negative for chills and fever.  HENT: Negative for congestion and facial swelling.   Eyes: Negative for discharge and visual disturbance.  Respiratory: Negative for shortness of breath.   Cardiovascular: Positive for chest pain. Negative for palpitations.  Gastrointestinal: Negative for abdominal pain, diarrhea and vomiting.  Musculoskeletal: Positive  for neck pain. Negative for arthralgias and myalgias.  Skin: Negative for color change and rash.  Neurological: Negative for tremors, syncope and headaches.  Psychiatric/Behavioral: Negative for confusion and dysphoric mood.    Physical Exam Updated Vital Signs BP 98/74   Pulse 69   Temp 98.2 F (36.8 C) (Oral)   Resp 17   Ht 5' 10"  (1.778 m)   Wt 111.1 kg   SpO2 97%   BMI 35.15 kg/m   Physical Exam Vitals and nursing note reviewed.  Constitutional:      Appearance: He is well-developed.  HENT:     Head: Normocephalic and atraumatic.  Eyes:     Pupils: Pupils are equal, round, and reactive to light.  Neck:     Vascular: No JVD.  Cardiovascular:     Rate and Rhythm: Normal rate and regular rhythm.     Heart sounds: No murmur heard.  No friction rub. No gallop.   Pulmonary:     Effort: No respiratory distress.     Breath sounds: No wheezing.  Abdominal:     General: There is no distension.     Tenderness: There is no abdominal tenderness. There is no guarding or rebound.  Musculoskeletal:        General: Normal range of motion.     Cervical back: Normal range of motion and neck supple.  Skin:    Coloration: Skin is not pale.     Findings: No rash.  Neurological:     Mental Status: He is alert and oriented to person, place, and time.  Psychiatric:        Behavior: Behavior normal.     ED Results / Procedures / Treatments   Labs (all labs ordered are listed, but only abnormal results are displayed) Labs Reviewed  BASIC METABOLIC PANEL - Abnormal; Notable for the following components:      Result Value   CO2 21 (*)    Glucose, Bld 111 (*)    All other components within normal limits  CBC WITH DIFFERENTIAL/PLATELET  MAGNESIUM    EKG EKG Interpretation  Date/Time:  Thursday April 14 2020 09:42:00 EDT Ventricular Rate:  68 PR Interval:    QRS Duration: 87 QT Interval:  370 QTC Calculation: 394 R Axis:   69 Text Interpretation: Sinus rhythm afib w  rvr resolved st elevation diffusely concave up likely BER, similar to 05/07/17 Confirmed by Deno Etienne [phone]) on 04/14/2020 9:47:43 AM   Radiology No results found.  Procedures .Sedation  Date/Time: 04/14/2020 9:31 AM Performed by: Deno Etienne, DO Authorized by: Deno Etienne, DO   Consent:    Consent obtained:  Verbal   Consent given by:  Patient   Risks discussed:  Allergic reaction, dysrhythmia, inadequate sedation, nausea, prolonged hypoxia resulting in organ damage, prolonged sedation necessitating reversal, respiratory compromise necessitating ventilatory assistance and intubation and vomiting   Alternatives discussed:  Analgesia without sedation, anxiolysis and regional anesthesia Universal protocol:  Procedure explained and questions answered to patient or proxy's satisfaction: yes     Relevant documents present and verified: yes     Test results available and properly labeled: yes     Imaging studies available: yes     Required blood products, implants, devices, and special equipment available: yes     Site/side marked: yes     Immediately prior to procedure a time out was called: yes     Patient identity confirmation method:  Verbally with patient Indications:    Procedure performed:  Cardioversion   Procedure necessitating sedation performed by:  Physician performing sedation Pre-sedation assessment:    Time since last food or drink:  6   ASA classification: class 1 - normal, healthy patient     Neck mobility: normal     Mouth opening:  3 or more finger widths   Thyromental distance:  4 finger widths   Mallampati score:  I - soft palate, uvula, fauces, pillars visible   Pre-sedation assessments completed and reviewed: airway patency, cardiovascular function, hydration status, mental status, nausea/vomiting, pain level, respiratory function and temperature   Immediate pre-procedure details:    Reassessment: Patient reassessed immediately prior to procedure     Reviewed:  vital signs, relevant labs/tests and NPO status     Verified: bag valve mask available, emergency equipment available, intubation equipment available, IV patency confirmed, oxygen available and suction available   Procedure details (see MAR for exact dosages):    Preoxygenation:  Nasal cannula   Sedation:  Propofol   Intended level of sedation: deep   Analgesia:  None   Intra-procedure monitoring:  Blood pressure monitoring, cardiac monitor, continuous pulse oximetry, frequent LOC assessments, frequent vital sign checks and continuous capnometry   Intra-procedure events: none     Total Provider sedation time (minutes):  35 Post-procedure details:    Attendance: Constant attendance by certified staff until patient recovered     Recovery: Patient returned to pre-procedure baseline     Post-sedation assessments completed and reviewed: airway patency, cardiovascular function, hydration status, mental status, nausea/vomiting, pain level, respiratory function and temperature     Patient is stable for discharge or admission: yes     Patient tolerance:  Tolerated well, no immediate complications .Cardioversion  Date/Time: 04/14/2020 9:32 AM Performed by: Deno Etienne, DO Authorized by: Deno Etienne, DO   Consent:    Consent obtained:  Verbal   Consent given by:  Patient   Risks discussed:  Cutaneous burn, death, induced arrhythmia and pain   Alternatives discussed:  Rate-control medication, anti-coagulation medication, delayed treatment and alternative treatment Pre-procedure details:    Cardioversion basis:  Elective   Rhythm:  Atrial fibrillation   Electrode placement:  Anterior-posterior Patient sedated: Yes. Refer to sedation procedure documentation for details of sedation.  Attempt one:    Cardioversion mode:  Synchronous   Waveform:  Biphasic   Shock (Joules):  200   Shock outcome:  Conversion to normal sinus rhythm Post-procedure details:    Patient status:  Awake   Patient  tolerance of procedure:  Tolerated well, no immediate complications   (including critical care time)  Medications Ordered in ED Medications  propofol (DIPRIVAN) 10 mg/mL bolus/IV push 55.6 mg (has no administration in time range)  metoprolol tartrate (LOPRESSOR) injection 5 mg (has no administration in time range)  propofol (DIPRIVAN) 10 mg/mL bolus/IV push (100 mcg Intravenous Given 04/14/20 0924)  apixaban (ELIQUIS) Education Kit for DVT/PE patients (has no administration in time range)  sodium  chloride 0.9 % bolus 1,000 mL (1,000 mLs Intravenous New Bag/Given 04/14/20 0857)  metoprolol tartrate (LOPRESSOR) injection 5 mg (5 mg Intravenous Given 04/14/20 9390)    ED Course  I have reviewed the triage vital signs and the nursing notes.  Pertinent labs & imaging results that were available during my care of the patient were reviewed by me and considered in my medical decision making (see chart for details).    MDM Rules/Calculators/A&P                          59 yo M with a chief complaints of A. fib with RVR.  This a recurrent episode for him.  Presents mostly as anterior neck pain.  Given a dose of metoprolol with improvement of rate.  Still symptomatic.  Cardioverted to normal sinus.  CRITICAL CARE Performed by: Cecilio Asper   Total critical care time: 35 minutes  Critical care time was exclusive of separately billable procedures and treating other patients.  Critical care was necessary to treat or prevent imminent or life-threatening deterioration.  Critical care was time spent personally by me on the following activities: development of treatment plan with patient and/or surrogate as well as nursing, discussions with consultants, evaluation of patient's response to treatment, examination of patient, obtaining history from patient or surrogate, ordering and performing treatments and interventions, ordering and review of laboratory studies, ordering and review of  radiographic studies, pulse oximetry and re-evaluation of patient's condition.   CHA2DS2/VAS Stroke Risk Points      N/A >= 2 Points: High Risk  1 - 1.99 Points: Medium Risk  0 Points: Low Risk    Last Change: N/A      This score determines the patient's risk of having a stroke if the  patient has atrial fibrillation.      This score is not applicable to this patient. Components are not  calculated.      10:07 AM:  I have discussed the diagnosis/risks/treatment options with the patient and believe the pt to be eligible for discharge home to follow-up with Cards. We also discussed returning to the ED immediately if new or worsening sx occur. We discussed the sx which are most concerning (e.g., sudden worsening pain, fever, inability to tolerate by mouth) that necessitate immediate return. Medications administered to the patient during their visit and any new prescriptions provided to the patient are listed below.  Medications given during this visit Medications  propofol (DIPRIVAN) 10 mg/mL bolus/IV push 55.6 mg (has no administration in time range)  metoprolol tartrate (LOPRESSOR) injection 5 mg (has no administration in time range)  propofol (DIPRIVAN) 10 mg/mL bolus/IV push (100 mcg Intravenous Given 04/14/20 0924)  apixaban [facility]) Education Kit for DVT/PE patients (has no administration in time range)  sodium chloride 0.9 % bolus 1,000 mL (1,000 mLs Intravenous New Bag/Given 04/14/20 0857)  metoprolol tartrate (LOPRESSOR) injection 5 mg (5 mg Intravenous Given 04/14/20 3009)     The patient appears reasonably screen and/or stabilized for discharge and I doubt any other medical condition or other [facility] requiring further screening, evaluation, or treatment in the ED at this time prior to discharge.   Final Clinical Impression(s) / ED Diagnoses Final diagnoses:  Atrial fibrillation with RVR ([place])    Rx / DC Orders ED Discharge Orders         Ordered    apixaban (ELIQUIS) 5 MG  TABS tablet  2 times daily  04/14/20 [place], [place], DO 04/14/20 1007

## 2020-04-14 NOTE — Discharge Instructions (Signed)
Return for recurrent symptoms or if you develop chest pain.  Information on my medicine - ELIQUIS (apixaban)  This medication education was reviewed with me or my healthcare representative as part of my discharge preparation.  The pharmacist that spoke with me during my hospital stay was:  Lavenia Atlas, [employer]  Why was Eliquis prescribed for you? Eliquis was prescribed for you to reduce the risk of a blood clot forming that can cause a stroke if you have a medical condition called atrial fibrillation (a type of irregular heartbeat).  What do You need to know about Eliquis ? Take your Eliquis TWICE DAILY - one tablet in the morning and one tablet in the evening with or without food. If you have difficulty swallowing the tablet whole please discuss with your pharmacist how to take the medication safely.  Take Eliquis exactly as prescribed by your doctor and DO NOT stop taking Eliquis without talking to the doctor who prescribed the medication.  Stopping may increase your risk of developing a stroke.  Refill your prescription before you run out.  After discharge, you should have regular check-up appointments with your healthcare provider that is prescribing your Eliquis.  In the future your dose may need to be changed if your kidney function or weight changes by a significant amount or as you get older.  What do you do if you miss a dose? If you miss a dose, take it as soon as you remember on the same day and resume taking twice daily.  Do not take more than one dose of ELIQUIS at the same time to make up a missed dose.  Important Safety Information A possible side effect of Eliquis is bleeding. You should call your healthcare provider right away if you experience any of the following: ? Bleeding from an injury or your nose that does not stop. ? Unusual colored urine (red or dark brown) or unusual colored stools (red or black). ? Unusual bruising for unknown reasons. ? A serious  fall or if you hit your head (even if there is no bleeding).  Some medicines may interact with Eliquis and might increase your risk of bleeding or clotting while on Eliquis. To help avoid this, consult your healthcare provider or pharmacist prior to using any new prescription or non-prescription medications, including herbals, vitamins, non-steroidal anti-inflammatory drugs (NSAIDs) and supplements.  This website has more information on Eliquis (apixaban): http://www.eliquis.com/eliquis/home

## 2020-04-14 NOTE — ED Triage Notes (Signed)
Pt from home; began having bilateral neck pressure last night; while driving to work today, pressure became worse and he began feeling sob; hx afib; denies CP, N/V; says he became diaphoretic while trying to find a parking spot; skin warm and dry at present

## 2020-04-14 NOTE — Sedation Documentation (Signed)
Shock delivered 200J

## 2020-04-15 ENCOUNTER — Telehealth (HOSPITAL_COMMUNITY): Payer: Self-pay | Admitting: Physician Assistant

## 2020-04-15 NOTE — Telephone Encounter (Signed)
Called patient and left message to call back.  Need to schedule ED f/u for dccv done 04/14/20.

## 2020-11-22 ENCOUNTER — Emergency Department (HOSPITAL_COMMUNITY): Payer: BC Managed Care – PPO

## 2020-11-22 ENCOUNTER — Emergency Department (HOSPITAL_COMMUNITY)
Admission: EM | Admit: 2020-11-22 | Discharge: 2020-11-22 | Disposition: A | Discharge status: Home / Self Care | Payer: BC Managed Care – PPO | Attending: Emergency Medicine | Admitting: Emergency Medicine

## 2020-11-22 ENCOUNTER — Encounter (HOSPITAL_COMMUNITY): Payer: Self-pay | Admitting: Pharmacy Technician

## 2020-11-22 DIAGNOSIS — Z7982 Long term (current) use of aspirin: Secondary | ICD-10-CM | POA: Insufficient documentation

## 2020-11-22 DIAGNOSIS — H538 Other visual disturbances: Secondary | ICD-10-CM | POA: Insufficient documentation

## 2020-11-22 DIAGNOSIS — R110 Nausea: Secondary | ICD-10-CM | POA: Diagnosis not present

## 2020-11-22 DIAGNOSIS — H53149 Visual discomfort, unspecified: Secondary | ICD-10-CM | POA: Diagnosis not present

## 2020-11-22 DIAGNOSIS — Z87891 Personal history of nicotine dependence: Secondary | ICD-10-CM | POA: Insufficient documentation

## 2020-11-22 DIAGNOSIS — R519 Headache, unspecified: Secondary | ICD-10-CM | POA: Diagnosis not present

## 2020-11-22 DIAGNOSIS — R42 Dizziness and giddiness: Secondary | ICD-10-CM | POA: Diagnosis not present

## 2020-11-22 DIAGNOSIS — Z7901 Long term (current) use of anticoagulants: Secondary | ICD-10-CM | POA: Insufficient documentation

## 2020-11-22 DIAGNOSIS — R531 Weakness: Secondary | ICD-10-CM | POA: Diagnosis not present

## 2020-11-22 LAB — CBC WITH DIFFERENTIAL/PLATELET
Abs Immature Granulocytes: 0.02 10*3/uL (ref 0.00–0.07)
Basophils Absolute: 0.1 10*3/uL (ref 0.0–0.1)
Basophils Relative: 1 %
Eosinophils Absolute: 0.2 10*3/uL (ref 0.0–0.5)
Eosinophils Relative: 3 %
HCT: 45.5 % (ref 39.0–52.0)
Hemoglobin: 15.8 g/dL (ref 13.0–17.0)
Immature Granulocytes: 0 %
Lymphocytes Relative: 13 %
Lymphs Abs: 0.9 10*3/uL (ref 0.7–4.0)
MCH: 32.4 pg (ref 26.0–34.0)
MCHC: 34.7 g/dL (ref 30.0–36.0)
MCV: 93.2 fL (ref 80.0–100.0)
Monocytes Absolute: 0.5 10*3/uL (ref 0.1–1.0)
Monocytes Relative: 7 %
Neutro Abs: 5.4 10*3/uL (ref 1.7–7.7)
Neutrophils Relative %: 76 %
Platelets: 178 10*3/uL (ref 150–400)
RBC: 4.88 MIL/uL (ref 4.22–5.81)
RDW: 11.9 % (ref 11.5–15.5)
WBC: 7.1 10*3/uL (ref 4.0–10.5)
nRBC: 0 % (ref 0.0–0.2)

## 2020-11-22 LAB — COMPREHENSIVE METABOLIC PANEL
ALT: 36 U/L (ref 0–44)
AST: 20 U/L (ref 15–41)
Albumin: 4.1 g/dL (ref 3.5–5.0)
Alkaline Phosphatase: 60 U/L (ref 38–126)
Anion gap: 7 (ref 5–15)
BUN: 11 mg/dL (ref 6–20)
CO2: 27 mmol/L (ref 22–32)
Calcium: 9.5 mg/dL (ref 8.9–10.3)
Chloride: 102 mmol/L (ref 98–111)
Creatinine, Ser: 0.93 mg/dL (ref 0.61–1.24)
GFR, Estimated: >60 mL/min (ref >60)
Glucose, Bld: 127 mg/dL — ABNORMAL HIGH (ref 70–99)
Potassium: 3.8 mmol/L (ref 3.5–5.1)
Sodium: 136 mmol/L (ref 135–145)
Total Bilirubin: 1.3 mg/dL — ABNORMAL HIGH (ref 0.3–1.2)
Total Protein: 7 g/dL (ref 6.5–8.1)

## 2020-11-22 MED ORDER — ONDANSETRON 4 MG PO TBDP
4.0000 mg | ORAL_TABLET | Freq: Once | ORAL | Status: AC
  Administered 2020-11-22: 4 mg via ORAL
  Filled 2020-11-22: qty 1

## 2020-11-22 MED ORDER — DIPHENHYDRAMINE HCL 50 MG/ML IJ SOLN
25.0000 mg | Freq: Once | INTRAMUSCULAR | Status: AC
  Administered 2020-11-22: 25 mg via INTRAVENOUS
  Filled 2020-11-22: qty 1

## 2020-11-22 MED ORDER — PROCHLORPERAZINE EDISYLATE 10 MG/2ML IJ SOLN
10.0000 mg | Freq: Once | INTRAMUSCULAR | Status: AC
  Administered 2020-11-22: 10 mg via INTRAVENOUS
  Filled 2020-11-22: qty 2

## 2020-11-22 MED ORDER — KETOROLAC TROMETHAMINE 15 MG/ML IJ SOLN
15.0000 mg | Freq: Once | INTRAMUSCULAR | Status: AC
  Administered 2020-11-22: 15 mg via INTRAVENOUS
  Filled 2020-11-22: qty 1

## 2020-11-22 MED ORDER — SODIUM CHLORIDE 0.9 % IV BOLUS
1000.0000 mL | Freq: Once | INTRAVENOUS | Status: AC
  Administered 2020-11-22: 1000 mL via INTRAVENOUS

## 2020-11-22 NOTE — ED Triage Notes (Signed)
Emergency Medicine Provider Triage Evaluation Note  Anthony Roach , a 60 y.o. male  was evaluated in triage.  Pt complains of headache associated with diplopia x 3 days. Headache started out gradual and worsened over the past 2 days. Patient notes his headache has turned into the worse headache of his life. Headache located over bilateral temporal region into eyes and posterior left side. No history of headaches. Patient states he was recently placed on Amoxicillin on Friday for a dental infection and believes his general unwell feeling is related to the medication. No PCN allergy; however he notes he has taken Amoxicillin previously which made him feel weird. He endorses generalized weakness and full body aches. Denies dizziness, speech changes, and unilateral weakness. Denies fever, but admits to chills.   Review of Systems  Positive: headache   Physical Exam  BP 131/86 (BP Location: Right Arm)   Pulse 79   Temp 98.8 F (37.1 C) (Oral)   Resp 18   SpO2 100%  Gen:   Awake, no distress   HEENT:  Atraumatic  Resp:  Normal effort  Cardiac:  Normal rate  Abd:   Nondistended, nontender  MSK:   Moves extremities without difficulty  Neuro:  Speech clear   Medical Decision Making  Medically screening exam initiated at 3:30 PM.  Appropriate orders placed.  [name] was informed that the remainder of the evaluation will be completed by another provider, this initial triage assessment does not replace that evaluation, and the importance of remaining in the ED until their evaluation is complete.  Clinical Impression  Headache with diplopia. CT head ordered to rule out intracranial hemorrhage. Routine labs ordered. Zofran given at triage.    Suzy Bouchard, [place] 11/22/20 1541

## 2020-11-22 NOTE — ED Triage Notes (Signed)
Pt here POV with reports of headache, chills and generalized weakness for several days. Headache worsened over the last 2 days. No facial droop, no neuro deficits noted. Pt recently started on amox for dental infection.

## 2020-11-22 NOTE — Discharge Instructions (Addendum)
Your laboratory results are within normal limits today.  The CT of your head did not show any intracranial acute process.  These follow-up with your primary care physician as needed.  If you experience any changes in vision, vomiting, worsening symptoms please return to the emergency department.

## 2020-11-22 NOTE — ED Provider Notes (Signed)
[facility]ENC[facility] DEPART[facility] Provider Note   CSN: [id number] Arrival date & time: 11/22/20  1431     History No chief complaint on file.   Anthony Roach is a 60 y.o. male.  60 y.o male with a PMH of Afib, Anxiety, SJS presents to the ED with a chief complaint of headache for the past 4 days.  Patient reports he was started on amoxicillin for a dental infection by his dentist on Friday, began to take the first dose on Saturday when he had a gradual onset of sharp headache to the frontal, parietal, temporal region.  Reports headache has now radiated throughout his whole head, this is exacerbated with light along with loud noise.  Has tried taking Tylenol along with Aleve without much improvement in symptoms.  Also endorses facial pain, along the left and right side.  He not have any prior history of migraines.  Does have a previous history of PAF, currently not anticoagulated.  Denies any neck pain, neck rigidity, fever, paresthesias, trauma.  The history is provided by the patient and the spouse.  Headache Pain location:  L temporal, L parietal, frontal and R temporal Radiates to:  Does not radiate Severity at highest:  10/10 Onset quality:  Gradual Duration:  4 days Timing:  Constant Progression:  Worsening Chronicity:  New Context: bright light and loud noise   Relieved by:  Acetaminophen and NSAIDs Worsened by:  Activity, light and sound Ineffective treatments:  Acetaminophen, aspirin and NSAIDs Associated symptoms: blurred vision, dizziness, facial pain, nausea, photophobia and weakness   Associated symptoms: no fever, no focal weakness, no numbness and no paresthesias        Past Medical History:  Diagnosis Date  . A-fib ([place])   . Anxiety   . Dysrhythmia    A Fib/Paroxysmal Atrial Fibrilation  . Renal disorder    kidney stones  . Stevens-Johnson syndrome ([place]) 2005    There are no problems to display for this patient.   Past Surgical  History:  Procedure Laterality Date  . Cysto, stents    . CYSTOSCOPY W/ URETERAL STENT PLACEMENT Bilateral 03/06/2016   Procedure: CYSTOSCOPY WITH BILATERAL RETROGRADE PYELOGRAM/BILATERAL URETEROSCOPY AND BILATERAL LASER APPLICATION, BILATERAL URETERAL STENT PLACEMENT;  Surgeon: Alexis Frock, MD;  Location: [facility];  Service: Urology;  Laterality: Bilateral;  . Rotator Cuff Repair Left Shoulder         No family history on file.  Social History   Tobacco Use  . Smoking status: Former Research scientist (life sciences)  . Smokeless tobacco: Never Used  Substance Use Topics  . Alcohol use: Yes    Comment: occ  . Drug use: No    Home Medications Prior to Admission medications   Medication Sig Start Date End Date Taking? Authorizing Provider  apixaban (ELIQUIS) 5 MG TABS tablet Take 1 tablet (5 mg total) by mouth 2 (two) times daily. 04/14/20 05/14/20  Deno Etienne, DO  aspirin EC 81 MG tablet Take 81 mg by mouth daily.    [provider]  escitalopram (LEXAPRO) 20 MG tablet Take 20 mg by mouth daily.    [provider]    Allergies    Bee pollen and Dust mite extract  Review of Systems   Review of Systems  Constitutional: Negative for fever.  Eyes: Positive for blurred vision and photophobia.  Gastrointestinal: Positive for nausea.  Neurological: Positive for dizziness, weakness and headaches. Negative for focal weakness, numbness and paresthesias.  All other systems reviewed and are  negative.   Physical Exam Updated Vital Signs BP 132/86   Pulse 70   Temp 98.8 F (37.1 C) (Oral)   Resp 12   SpO2 97%   Physical Exam Vitals and nursing note reviewed.  Constitutional:      Appearance: He is well-developed.  HENT:     Head: Normocephalic and atraumatic.     Mouth/Throat:     Comments: Oropharynx is clear, uvula is midline without any tonsillar exudate or swelling.  No visible dental abscess, poor dentition throughout. Eyes:     General: No scleral icterus.    Pupils: Pupils  are equal, round, and reactive to light.  Cardiovascular:     Heart sounds: Normal heart sounds.  Pulmonary:     Effort: Pulmonary effort is normal.     Breath sounds: Normal breath sounds. No wheezing.  Chest:     Chest wall: No tenderness.  Abdominal:     General: Bowel sounds are normal. There is no distension.     Palpations: Abdomen is soft.     Tenderness: There is no abdominal tenderness.  Musculoskeletal:        General: No tenderness or deformity.     Cervical back: Normal range of motion.  Skin:    General: Skin is warm and dry.  Neurological:     Mental Status: He is alert and oriented to person, place, and time.     Comments: Alert, oriented, thought content appropriate. Speech fluent without evidence of aphasia. Able to follow 2 step commands without difficulty.  Cranial Nerves:  II:  Peripheral visual fields grossly normal, pupils, round, reactive to light III,IV, VI: ptosis not present, extra-ocular motions intact bilaterally  V,VII: smile symmetric, facial light touch sensation equal VIII: hearing grossly normal bilaterally  IX,X: midline uvula rise  XI: bilateral shoulder shrug equal and strong XII: midline tongue extension  Motor:  5/5 in upper and lower extremities bilaterally including strong and equal grip strength and dorsiflexion/plantar flexion Sensory: light touch normal in all extremities.  Cerebellar: normal finger-to-nose with bilateral upper extremities, pronator drift negative Gait: normal gait and balance      ED Results / Procedures / Treatments   Labs (all labs ordered are listed, but only abnormal results are displayed) Labs Reviewed  COMPREHENSIVE METABOLIC PANEL - Abnormal; Notable for the following components:      Result Value   Glucose, Bld 127 (*)    Total Bilirubin 1.3 (*)    All other components within normal limits  CBC WITH DIFFERENTIAL/PLATELET    EKG None  Radiology CT Head Wo Contrast  Result Date:  11/22/2020 CLINICAL DATA:  Rule out cerebral hemorrhage. EXAM: CT HEAD WITHOUT CONTRAST TECHNIQUE: Contiguous axial images were obtained from the base of the skull through the vertex without intravenous contrast. COMPARISON:  CT head 09/15/2006 FINDINGS: Brain: No evidence of acute infarction, hemorrhage, hydrocephalus, extra-axial collection or mass lesion/mass effect. Vascular: Negative for hyperdense vessel Skull: Negative Sinuses/Orbits: Mild mucosal edema paranasal sinuses. Negative orbit Other: None IMPRESSION: Negative CT head Mild paranasal sinus mucosal edema. Electronically Signed   By: Franchot Gallo M.D.   On: 11/22/2020 16:55    Procedures Procedures   Medications Ordered in ED Medications  ondansetron (ZOFRAN-ODT) disintegrating tablet 4 mg (4 mg Oral Given 11/22/20 1539)  sodium chloride 0.9 % bolus 1,000 mL (1,000 mLs Intravenous New Bag/Given 11/22/20 1758)  diphenhydrAMINE (BENADRYL) injection 25 mg (25 mg Intravenous Given 11/22/20 1809)  prochlorperazine (COMPAZINE) injection 10 mg (10 mg  Intravenous Given 11/22/20 1811)  ketorolac (TORADOL) 15 MG/ML injection 15 mg (15 mg Intravenous Given 11/22/20 1807)    ED Course  I have reviewed the triage vital signs and the nursing notes.  Pertinent labs & imaging results that were available during my care of the patient were reviewed by me and considered in my medical decision making (see chart for details).    MDM Rules/Calculators/A&P  Patient here with a complaint of a headache for the past 4 days.  Reports that his headache began after he began amoxicillin.  Has been taking over-the-counter medication without improvement in symptoms.  Showed concern today as the headache felt like it was worsening despite over-the-counter medication.  States that he feels in the headache was likely due to his amoxicillin prescription, discontinued taking medication last night.  Denies any fever, neck rigidity, neck pain, changes in his vision.  Does  report headache is exacerbated with light along with sound.  Arrived in the ED with stable vital signs, afebrile.  Neuro exam was unremarkable, was ambulatory in the ED.  Interpretation of his white reveal a CBC without any leukocytosis, hemoglobin is within normal limits.  BMP without any electrolyte derangement, creatinine level is within normal limits.  LFTs are unremarkable.  He does have a current dental infection, also voices dental pain to the left and right side along the temporal region where most of his headache is also located.  No prior history of migraines, aneurysms, trauma.  CT Head: Negative CT head    Mild paranasal sinus mucosal edema.     CT Head was discussed at length with patient.  He will be provided with a headache cocktail with Benadryl, Compazine, Toradol to help with symptomatic control.  6:46 PM patient reassessed by me with improvement in his symptoms.  States medication is working.  Vitals remained stable, we discussed likely disposition after completion of medication.  Patient care signed out to Dr. Francia Greaves.   Portions of this note were generated with [employer]. Dictation errors may occur despite best attempts at proofreading.  Final Clinical Impression(s) / ED Diagnoses Final diagnoses:  Bad headache    Rx / DC Orders ED Discharge Orders    None       Janeece Fitting, PA-C 11/22/20 1847    Valarie Merino, MD 11/22/20 2330

## 2021-09-25 ENCOUNTER — Encounter (HOSPITAL_COMMUNITY): Payer: Self-pay

## 2021-09-25 ENCOUNTER — Emergency Department (HOSPITAL_COMMUNITY)
Admission: EM | Admit: 2021-09-25 | Discharge: 2021-09-25 | Disposition: A | Discharge status: Home / Self Care | Payer: BC Managed Care – PPO | Attending: Emergency Medicine | Admitting: Emergency Medicine

## 2021-09-25 ENCOUNTER — Telehealth (HOSPITAL_COMMUNITY): Payer: Self-pay | Admitting: Physician Assistant

## 2021-09-25 ENCOUNTER — Other Ambulatory Visit: Payer: Self-pay

## 2021-09-25 DIAGNOSIS — Z7982 Long term (current) use of aspirin: Secondary | ICD-10-CM | POA: Insufficient documentation

## 2021-09-25 DIAGNOSIS — R7989 Other specified abnormal findings of blood chemistry: Secondary | ICD-10-CM | POA: Insufficient documentation

## 2021-09-25 DIAGNOSIS — I480 Paroxysmal atrial fibrillation: Secondary | ICD-10-CM | POA: Diagnosis not present

## 2021-09-25 DIAGNOSIS — R002 Palpitations: Secondary | ICD-10-CM | POA: Diagnosis present

## 2021-09-25 LAB — BASIC METABOLIC PANEL
Anion gap: 6 (ref 5–15)
BUN: 15 mg/dL (ref 6–20)
CO2: 25 mmol/L (ref 22–32)
Calcium: 9 mg/dL (ref 8.9–10.3)
Chloride: 105 mmol/L (ref 98–111)
Creatinine, Ser: 1.46 mg/dL — ABNORMAL HIGH (ref 0.61–1.24)
GFR, Estimated: 55 mL/min — ABNORMAL LOW (ref >60)
Glucose, Bld: 119 mg/dL — ABNORMAL HIGH (ref 70–99)
Potassium: 4.5 mmol/L (ref 3.5–5.1)
Sodium: 136 mmol/L (ref 135–145)

## 2021-09-25 LAB — CBC
HCT: 49.8 % (ref 39.0–52.0)
Hemoglobin: 17.2 g/dL — ABNORMAL HIGH (ref 13.0–17.0)
MCH: 32.6 pg (ref 26.0–34.0)
MCHC: 34.5 g/dL (ref 30.0–36.0)
MCV: 94.5 fL (ref 80.0–100.0)
Platelets: 190 10*3/uL (ref 150–400)
RBC: 5.27 MIL/uL (ref 4.22–5.81)
RDW: 11.9 % (ref 11.5–15.5)
WBC: 8.5 10*3/uL (ref 4.0–10.5)
nRBC: 0 % (ref 0.0–0.2)

## 2021-09-25 LAB — MAGNESIUM: Magnesium: 2.1 mg/dL (ref 1.7–2.4)

## 2021-09-25 MED ORDER — APIXABAN 5 MG PO TABS: 5.0000 mg | ORAL_TABLET | Freq: Two times a day (BID) | ORAL | 0 refills | Status: DC

## 2021-09-25 MED ORDER — APIXABAN 5 MG PO TABS
5.0000 mg | ORAL_TABLET | Freq: Once | ORAL | Status: AC
  Administered 2021-09-25: 5 mg via ORAL
  Filled 2021-09-25: qty 1

## 2021-09-25 MED ORDER — ONDANSETRON HCL 4 MG/2ML IJ SOLN
4.0000 mg | Freq: Once | INTRAMUSCULAR | Status: AC
  Administered 2021-09-25: 4 mg via INTRAVENOUS
  Filled 2021-09-25: qty 2

## 2021-09-25 MED ORDER — PROPOFOL 10 MG/ML IV BOLUS
0.5000 mg/kg | Freq: Once | INTRAVENOUS | Status: AC
  Administered 2021-09-25: 53.3 mg via INTRAVENOUS
  Filled 2021-09-25: qty 20

## 2021-09-25 NOTE — ED Notes (Signed)
Patients oxygen saturation at 88% room air, 2L [place] placed on patient.

## 2021-09-25 NOTE — ED Notes (Signed)
Procedural Sedation -Consent signed at 5:30 -Respiratory at bedside Myrtie Neither, Dr. Roxanne Mins, Hulen Shouts RN -Time out at 5:34 -Propofol in at 5:35 -120J shocked at 5:36 -Normal Sinus Rhythm at 2:33 -Dr. Roxanne Mins at bedside until patient was able to be fully alert  -Sedation completed at 5:38

## 2021-09-25 NOTE — ED Triage Notes (Signed)
Pt states that he has Afib with RVR and feels that it is happening since he woke up this morning. Pt complains of pressure to the back of his head.

## 2021-09-25 NOTE — Discharge Instructions (Addendum)
Your creatinine level was slightly high today.  This is a test of kidney function.  Please make sure that you are drinking enough fluids.  Your primary care provider may want to repeat this blood test in 2-4 weeks to see if it is coming back down to normal.  Information on my medicine - ELIQUIS (apixaban)  Why was Eliquis prescribed for you? Eliquis was prescribed for you to reduce the risk of a blood clot forming that can cause a stroke if you have a medical condition called atrial fibrillation (a type of irregular heartbeat).  What do You need to know about Eliquis ? Take your Eliquis TWICE DAILY - one tablet in the morning and one tablet in the evening with or without food. If you have difficulty swallowing the tablet whole please discuss with your pharmacist how to take the medication safely.  Take Eliquis exactly as prescribed by your doctor and DO NOT stop taking Eliquis without talking to the doctor who prescribed the medication.  Stopping may increase your risk of developing a stroke.  Refill your prescription before you run out.  After discharge, you should have regular check-up appointments with your healthcare provider that is prescribing your Eliquis.  In the future your dose may need to be changed if your kidney function or weight changes by a significant amount or as you get older.  What do you do if you miss a dose? If you miss a dose, take it as soon as you remember on the same day and resume taking twice daily.  Do not take more than one dose of ELIQUIS at the same time to make up a missed dose.  Important Safety Information A possible side effect of Eliquis is bleeding. You should call your healthcare provider right away if you experience any of the following: Bleeding from an injury or your nose that does not stop. Unusual colored urine (red or dark brown) or unusual colored stools (red or black). Unusual bruising for unknown reasons. A serious fall or if you hit  your head (even if there is no bleeding).  Some medicines may interact with Eliquis and might increase your risk of bleeding or clotting while on Eliquis. To help avoid this, consult your healthcare provider or pharmacist prior to using any new prescription or non-prescription medications, including herbals, vitamins, non-steroidal anti-inflammatory drugs (NSAIDs) and supplements.  This website has more information on Eliquis (apixaban): http://www.eliquis.com/eliquis/home

## 2021-09-25 NOTE — ED Provider Notes (Signed)
[facility] DEPT Provider Note   CSN: [id number] Arrival date & time: 09/25/21  0445     History  Chief Complaint  Patient presents with   A FIB with RVR     Anthony Roach is a 61 y.o. male.  The history is provided by the patient.  He has history of paroxysmal atrial fibrillation but not on anticoagulation because of infrequent occurrences and was awakened by palpitations tonight.  He has had some mild nausea and mild dyspnea but no vomiting and no diaphoresis.  He denies chest pain, heaviness, tightness, pressure.   Home Medications Prior to Admission medications   Medication Sig Start Date End Date Taking? Authorizing Provider  apixaban (ELIQUIS) 5 MG TABS tablet Take 1 tablet (5 mg total) by mouth 2 (two) times daily. 04/14/20 05/14/20  Deno Etienne, DO  aspirin EC 81 MG tablet Take 81 mg by mouth daily.    [provider]  escitalopram (LEXAPRO) 20 MG tablet Take 20 mg by mouth daily.    [provider]      Allergies    Bee pollen and Dust mite extract    Review of Systems   Review of Systems  All other systems reviewed and are negative.  Physical Exam Updated Vital Signs BP 99/75 (BP Location: Left Arm)    Pulse (!) 132    Temp 97.7 F (36.5 C) (Oral)    Resp 18    Ht 5\' 10"  (1.778 m)    Wt 106.6 kg    BMI 33.72 kg/m  Physical Exam Vitals and nursing note reviewed.  61 year old male, resting comfortably and in no acute distress. Vital signs are significant for rapid heart rate. Oxygen saturation is 98%, which is normal. Head is normocephalic and atraumatic. PERRLA, EOMI. Oropharynx is clear. Neck is nontender and supple without adenopathy or JVD. Back is nontender and there is no CVA tenderness. Lungs are clear without rales, wheezes, or rhonchi. Chest is nontender. Heart is tachycardic and irregular without murmur. Abdomen is soft, flat, nontender. Extremities have no cyanosis or edema, full range of motion is  present. Skin is warm and dry without rash. Neurologic: Mental status is normal, cranial nerves are intact, moves all extremities equally.  ED Results / Procedures / Treatments   Labs (all labs ordered are listed, but only abnormal results are displayed) Labs Reviewed  BASIC METABOLIC PANEL - Abnormal; Notable for the following components:      Result Value   Glucose, Bld 119 (*)    Creatinine, Ser 1.46 (*)    GFR, Estimated 55 (*)    All other components within normal limits  CBC - Abnormal; Notable for the following components:   Hemoglobin 17.2 (*)    All other components within normal limits  MAGNESIUM    EKG EKG Interpretation  Date/Time:  Monday September 25 2021 04:54:02 EST Ventricular Rate:  167 PR Interval:    QRS Duration: 81 QT Interval:  278 QTC Calculation: 464 R Axis:   40 Text Interpretation: Atrial fibrillation with rapid V-rate Low voltage, precordial leads When compared with ECG of 04/14/2020, Atrial fibrillation with rapid ventricular response has replaced Sinus rhythm Confirmed by Delora Fuel ([id number]) on 09/25/2021 5:07:50 AM   EKG Interpretation  Date/Time:  Monday September 25 2021 05:39:24 EST Ventricular Rate:  69 PR Interval:  182 QRS Duration: 85 QT Interval:  360 QTC Calculation: 386 R Axis:   35 Text Interpretation: Sinus rhythm Anteroseptal infarct, old  ST elevation, consider inferior injury When compared with ECG of EARLIER SAME DATE Sinus rhythm has replaced Atrial fibrillation with rapid ventricular response ST elevation has been present on prior ECG's Confirmed by Delora Fuel ([id number]) on 09/25/2021 5:47:29 AM       Procedures .Cardioversion  Date/Time: 09/25/2021 5:39 AM Performed by: Delora Fuel, MD Authorized by: Delora Fuel, MD   Consent:    Consent obtained:  Verbal   Consent given by:  Patient   Risks discussed:  Cutaneous burn, death and induced arrhythmia   Alternatives discussed:  Rate-control medication Universal protocol:     Procedure explained and questions answered to patient or proxy's satisfaction: yes     Relevant documents present and verified: yes     Test results available and properly labeled: yes     Required blood products, implants, devices, and special equipment available: yes     Site/side marked: yes     Immediately prior to procedure a time out was called: yes     Patient identity confirmed:  Verbally with patient and arm band Pre-procedure details:    Cardioversion basis:  Emergent   Rhythm:  Atrial fibrillation   Electrode placement:  Anterior-posterior Patient sedated: Yes. Refer to sedation procedure documentation for details of sedation.  Attempt one:    Cardioversion mode:  Synchronous   Waveform:  Biphasic   Shock (Joules):  120   Shock outcome:  Conversion to normal sinus rhythm Post-procedure details:    Patient status:  Awake   Patient tolerance of procedure:  Tolerated well, no immediate complications .Sedation  Date/Time: 09/25/2021 5:40 AM Performed by: Delora Fuel, MD Authorized by: Delora Fuel, MD   Consent:    Consent obtained:  Verbal   Consent given by:  Patient   Risks discussed:  Prolonged hypoxia resulting in organ damage, dysrhythmia, inadequate sedation, respiratory compromise necessitating ventilatory assistance and intubation, allergic reaction, nausea and vomiting   Alternatives discussed:  Anxiolysis Universal protocol:    Procedure explained and questions answered to patient or proxy's satisfaction: yes     Relevant documents present and verified: yes     Test results available: yes     Required blood products, implants, devices, and special equipment available: yes     Site/side marked: yes     Immediately prior to procedure, a time out was called: yes     Patient identity confirmed:  Arm band and verbally with patient Indications:    Procedure performed:  Cardioversion   Procedure necessitating sedation performed by:  Physician performing  sedation Pre-sedation assessment:    Time since last food or drink:  9 hours   ASA classification: class 1 - normal, healthy patient     Mouth opening:  3 or more finger widths   Thyromental distance:  4 finger widths   Mallampati score:  I - soft palate, uvula, fauces, pillars visible   Neck mobility: normal     Pre-sedation assessments completed and reviewed: airway patency, cardiovascular function, hydration status, mental status, pain level, respiratory function and temperature     Pre-sedation assessments completed and reviewed: pre-procedure nausea and vomiting status not reviewed     Pre-sedation assessment completed:  09/25/2021 5:25 AM Immediate pre-procedure details:    Reassessment: Patient reassessed immediately prior to procedure     Reviewed: vital signs and NPO status     Verified: bag valve mask available, emergency equipment available, intubation equipment available, IV patency confirmed, oxygen available and suction available   Procedure details (  see MAR for exact dosages):    Preoxygenation:  Nasal cannula   Sedation:  Propofol   Intended level of sedation: moderate (conscious sedation)   Intra-procedure monitoring:  Blood pressure monitoring, continuous capnometry, frequent LOC assessments, frequent vital sign checks, continuous pulse oximetry and cardiac monitor   Intra-procedure events: none     Total Provider sedation time (minutes):  30 Post-procedure details:    Post-sedation assessment completed:  09/25/2021 5:55 AM   Attendance: Constant attendance by certified staff until patient recovered     Recovery: Patient returned to pre-procedure baseline     Post-sedation assessments completed and reviewed: airway patency, cardiovascular function, hydration status, mental status, pain level, respiratory function and temperature     Post-sedation assessments completed and reviewed: post-procedure nausea and vomiting status not reviewed     Patient is stable for discharge or  admission: yes     Procedure completion:  Tolerated well, no immediate complications  Cardiac monitor shows atrial fibrillation with rapid ventricular response, per my interpretation.  Following cardioversion, cardiac monitor shows normal sinus rhythm, per my interpretation.  Medications Ordered in ED Medications  propofol (DIPRIVAN) 10 mg/mL bolus/IV push 53.3 mg (53.3 mg Intravenous Given 09/25/21 0538)  apixaban (ELIQUIS) tablet 5 mg (5 mg Oral Given 09/25/21 0552)  ondansetron (ZOFRAN) injection 4 mg (4 mg Intravenous Given 09/25/21 0160)    ED Course/ Medical Decision Making/ A&P                           Medical Decision Making Amount and/or Complexity of Data Reviewed Labs: ordered. ECG/medicine tests: ordered and independent interpretation performed.  Risk Prescription drug management.   Paroxysmal atrial fibrillation.  Old records reviewed showing prior ED visits for atrial fibrillation treated with cardioversion in the ED.  Plan is to proceed with procedural sedation and electrical cardioversion.  Cardioversion was successful, patient is now in sinus rhythm.  He was observed in the ED with stable cardiac rhythm.  Labs are significant for mild elevation in creatinine.  On review of old records, he did have prior episode of mildly elevated creatinine in 2017 with all subsequent creatinine levels normal.  Patient is advised of this finding, and advised to drink plenty of fluids.  He is discharged with prescription for apixaban and referred to the [facility] atrial fibrillation clinic.  CHA2DS2/VAS Stroke Risk Points  1 - 1.99 Points: Medium Risk  0 Points: Low Risk    Last Change: N/A      This score determines the patient's risk of having a stroke if the  patient has atrial fibrillation.      His score is 0, which puts him at low risk.     CRITICAL CARE Performed by: Delora Fuel Total critical care time: 35 minutes Critical care time was exclusive of separately billable  procedures and treating other patients. Critical care was necessary to treat or prevent imminent or life-threatening deterioration. Critical care was time spent personally by me on the following activities: development of treatment plan with patient and/or surrogate as well as nursing, discussions with consultants, evaluation of patient's response to treatment, examination of patient, obtaining history from patient or surrogate, ordering and performing treatments and interventions, ordering and review of laboratory studies, ordering and review of radiographic studies, pulse oximetry and re-evaluation of patient's condition.       Final Clinical Impression(s) / ED Diagnoses Final diagnoses:  Paroxysmal atrial fibrillation (HCC)  Elevated serum creatinine  Rx / DC Orders ED Discharge Orders          Ordered    Amb referral to AFIB Clinic        09/25/21 0521    apixaban (ELIQUIS) 5 MG TABS tablet  2 times daily        09/25/21 2671              Delora Fuel, MD 24/58/09 [phone]

## 2021-09-25 NOTE — Telephone Encounter (Signed)
Called and left message for patient to call back to schedule f/u after ED cardioversion.

## 2021-09-29 NOTE — Telephone Encounter (Signed)
Called and left 2nd message for patient to call back to schedule.

## 2022-02-06 DIAGNOSIS — K429 Umbilical hernia without obstruction or gangrene: Secondary | ICD-10-CM | POA: Insufficient documentation

## 2022-04-23 ENCOUNTER — Encounter (HOSPITAL_COMMUNITY): Payer: Self-pay

## 2022-04-23 ENCOUNTER — Other Ambulatory Visit: Payer: Self-pay

## 2022-04-23 ENCOUNTER — Emergency Department (HOSPITAL_COMMUNITY)
Admission: EM | Admit: 2022-04-23 | Discharge: 2022-04-23 | Disposition: A | Discharge status: Home / Self Care | Payer: BC Managed Care – PPO | Source: Home / Self Care | Attending: Emergency Medicine | Admitting: Emergency Medicine

## 2022-04-23 DIAGNOSIS — Z7901 Long term (current) use of anticoagulants: Secondary | ICD-10-CM | POA: Insufficient documentation

## 2022-04-23 DIAGNOSIS — I4891 Unspecified atrial fibrillation: Secondary | ICD-10-CM

## 2022-04-23 DIAGNOSIS — I4819 Other persistent atrial fibrillation: Secondary | ICD-10-CM | POA: Diagnosis not present

## 2022-04-23 LAB — BASIC METABOLIC PANEL
Anion gap: 6 (ref 5–15)
BUN: 21 mg/dL — ABNORMAL HIGH (ref 6–20)
CO2: 25 mmol/L (ref 22–32)
Calcium: 9.1 mg/dL (ref 8.9–10.3)
Chloride: 113 mmol/L — ABNORMAL HIGH (ref 98–111)
Creatinine, Ser: 1.12 mg/dL (ref 0.61–1.24)
GFR, Estimated: >60 mL/min (ref >60)
Glucose, Bld: 105 mg/dL — ABNORMAL HIGH (ref 70–99)
Potassium: 4.4 mmol/L (ref 3.5–5.1)
Sodium: 144 mmol/L (ref 135–145)

## 2022-04-23 LAB — CBC WITH DIFFERENTIAL/PLATELET
Abs Immature Granulocytes: 0.02 10*3/uL (ref 0.00–0.07)
Basophils Absolute: 0.1 10*3/uL (ref 0.0–0.1)
Basophils Relative: 1 %
Eosinophils Absolute: 0.3 10*3/uL (ref 0.0–0.5)
Eosinophils Relative: 5 %
HCT: 41.4 % (ref 39.0–52.0)
Hemoglobin: 13.9 g/dL (ref 13.0–17.0)
Immature Granulocytes: 0 %
Lymphocytes Relative: 28 %
Lymphs Abs: 1.8 10*3/uL (ref 0.7–4.0)
MCH: 32.1 pg (ref 26.0–34.0)
MCHC: 33.6 g/dL (ref 30.0–36.0)
MCV: 95.6 fL (ref 80.0–100.0)
Monocytes Absolute: 0.5 10*3/uL (ref 0.1–1.0)
Monocytes Relative: 8 %
Neutro Abs: 3.8 10*3/uL (ref 1.7–7.7)
Neutrophils Relative %: 58 %
Platelets: 178 10*3/uL (ref 150–400)
RBC: 4.33 MIL/uL (ref 4.22–5.81)
RDW: 12.4 % (ref 11.5–15.5)
WBC: 6.4 10*3/uL (ref 4.0–10.5)
nRBC: 0 % (ref 0.0–0.2)

## 2022-04-23 MED ORDER — APIXABAN 5 MG PO TABS
5.0000 mg | ORAL_TABLET | Freq: Two times a day (BID) | ORAL | 0 refills | Status: DC
Start: 2022-04-23 — End: 2023-11-20

## 2022-04-23 MED ORDER — DILTIAZEM HCL 30 MG PO TABS
60.0000 mg | ORAL_TABLET | Freq: Once | ORAL | Status: AC
  Administered 2022-04-23: 60 mg via ORAL
  Filled 2022-04-23: qty 2

## 2022-04-23 MED ORDER — DILTIAZEM HCL ER COATED BEADS 120 MG PO CP24: 120.0000 mg | ORAL_CAPSULE | Freq: Every day | ORAL | 0 refills | Status: DC

## 2022-04-23 NOTE — ED Triage Notes (Signed)
Pt states he is here due to his A Fib. Pt states he has hx of A Fib with RVR. Pt denies chest pain, c/o pressure in his chest and neck. Pt denies SOB.

## 2022-04-23 NOTE — Discharge Instructions (Addendum)
Follow-up with the atrial fibrillation clinic as discussed.  If they do not call you for an appointment, call the number above to make an appointment.  Take the medications as discussed.  If you convert back to a normal rhythm, you can stop taking the diltiazem.  Return to emergency room if you have any worsening symptoms.

## 2022-04-23 NOTE — ED Notes (Signed)
LAC 20g PIV d/c'd. Area clean, dressing applied.

## 2022-04-23 NOTE — ED Provider Notes (Addendum)
[facility] DEPT Provider Note   CSN: [id number] Arrival date & time: 04/23/22  1647     History  Chief Complaint  Patient presents with   Atrial Fibrillation    Anthony Roach is a 61 y.o. male.  Patient is a 61 year old male who presents with atrial fibrillation.  He has a prior history of atrial fibrillation with RVR.  He has intermittent episodes and he says usually when he gets that he comes into the ED and gets cardioverted.  He says he has been having episodes since he was 13.  He does not have a cardiologist.  He has had 2 visits that I can see recently in the ED where he has had any cardioversions.  On his last visit, he was prescribed Eliquis but he said he does not take it.  He said he does not want to take any medications for this.  He is not sure exactly when it started.  He felt that it was racing today about an hour prior to arrival but he said he felt bad over the last 2 to 3 days and thinks he might of had some episodes at that time although it was not racing.  He denies any increased leg.       Home Medications Prior to Admission medications   Medication Sig Start Date End Date Taking? Authorizing Provider  diltiazem (CARDIZEM CD) 120 MG 24 hr capsule Take 1 capsule (120 mg total) by mouth daily. 04/23/22  Yes Malvin Johns, MD  apixaban (ELIQUIS) 5 MG TABS tablet Take 1 tablet (5 mg total) by mouth 2 (two) times daily. 04/23/22 05/23/22  Malvin Johns, MD  escitalopram (LEXAPRO) 20 MG tablet Take 20 mg by mouth daily.    [provider]  metoprolol succinate (TOPROL-XL) 25 MG 24 hr tablet PLEASE SEE ATTACHED FOR DETAILED DIRECTIONS 09/04/21   [provider]      Allergies    Bee venom and Dust mite extract    Review of Systems   Review of Systems  Constitutional:  Negative for chills, diaphoresis, fatigue and fever.  HENT:  Negative for congestion, rhinorrhea and sneezing.   Eyes: Negative.   Respiratory:   Negative for cough, chest tightness and shortness of breath.   Cardiovascular:  Positive for palpitations. Negative for chest pain and leg swelling.  Gastrointestinal:  Negative for abdominal pain, blood in stool, diarrhea, nausea and vomiting.  Genitourinary:  Negative for difficulty urinating, flank pain, frequency and hematuria.  Musculoskeletal:  Negative for arthralgias and back pain.  Skin:  Negative for rash.  Neurological:  Negative for dizziness, speech difficulty, weakness, numbness and headaches.    Physical Exam Updated Vital Signs BP 110/82   Pulse 73   Temp 98 F (36.7 C) (Oral)   Resp 16   SpO2 94%  Physical Exam Constitutional:      Appearance: He is well-developed.  HENT:     Head: Normocephalic and atraumatic.  Eyes:     Pupils: Pupils are equal, round, and reactive to light.  Cardiovascular:     Rate and Rhythm: Tachycardia present. Rhythm irregular.     Heart sounds: Normal heart sounds.  Pulmonary:     Effort: Pulmonary effort is normal. No respiratory distress.     Breath sounds: Normal breath sounds. No wheezing or rales.  Chest:     Chest wall: No tenderness.  Abdominal:     General: Bowel sounds are normal.     Palpations: Abdomen  is soft.     Tenderness: There is no abdominal tenderness. There is no guarding or rebound.  Musculoskeletal:        General: Normal range of motion.     Cervical back: Normal range of motion and neck supple.  Lymphadenopathy:     Cervical: No cervical adenopathy.  Skin:    General: Skin is warm and dry.     Findings: No rash.  Neurological:     Mental Status: He is alert and oriented to person, place, and time.     ED Results / Procedures / Treatments   Labs (all labs ordered are listed, but only abnormal results are displayed) Labs Reviewed  BASIC METABOLIC PANEL - Abnormal; Notable for the following components:      Result Value   Chloride 113 (*)    Glucose, Bld 105 (*)    BUN 21 (*)    All other  components within normal limits  CBC WITH DIFFERENTIAL/PLATELET    EKG EKG Interpretation  Date/Time:  Monday April 23 2022 16:53:33 EDT Ventricular Rate:  131 PR Interval:    QRS Duration: 76 QT Interval:  297 QTC Calculation: 439 R Axis:   39 Text Interpretation: Atrial fibrillation Abnormal R-wave progression, early transition Confirmed by Malvin Johns [phone]) on 04/23/2022 6:38:55 PM  Radiology No results found.  Procedures Procedures    Medications Ordered in ED Medications  diltiazem (CARDIZEM) tablet 60 mg (60 mg Oral Given 04/23/22 1802)    ED Course/ Medical Decision Making/ A&P                           Medical Decision Making Problems Addressed: Atrial fibrillation with RVR [facility]): acute illness or injury that poses a threat to life or bodily functions  Amount and/or Complexity of Data Reviewed Independent Historian: spouse External Data Reviewed: notes. Labs: ordered.  Risk Prescription drug management.   Patient is a 61 year old male who presents with A-fib with RVR.  His heart rate was in the 120s to 130s on arrival.  He has a known history of A-fib.  He says he has had it since he was 47 and has had to come in to the ED several times and get a cardioversion.  He is not on any medications for this.  On his last ED visit, he was prescribed Eliquis but he did not take it.  He is averse to taking medications.  He has not seen a cardiologist.  He says that he normally can tell when he goes into the A-fib.  He felt like he may have been in it over the last 2 to 3 days but he felt to start racing today about an hour prior to arrival.  Given that we could not identify the time that it actually started, cardioversion in the ED was contraindicated given that he is not on anticoagulants.  Labs are obtained and are nonconcerning.  He was given a dose of oral Cardizem in the ED and his heart rate has improved into the 70s.  I discussed the patient with Dr. Caryl Comes with  cardiology.  He recommends starting the patient on Eliquis and does recommend Cardizem as long as the patient is in A-fib.  If he converts back, he can the Cardizem.  I discussed with the patient possibly wearing a smart watch or Apple Watch to help determine when he goes in and out of the A-fib.  He was given an Dealer referral  to cardiology to the A-fib clinic.  I had a long discussion with him about the benefits of medication use for the A-fib.  If he takes the Eliquis, he would potentially have an option of cardioversion through the cardiology office or ablation.  I discussed the stroke risk with A-fib.  Also gave him precautions while using the Eliquis.  At this point he is amenable to starting the Eliquis and Cardizem.  Return precautions were given.  Final Clinical Impression(s) / ED Diagnoses Final diagnoses:  Atrial fibrillation with RVR ([place])    Rx / DC Orders ED Discharge Orders          Ordered    apixaban (ELIQUIS) 5 MG TABS tablet  2 times daily        04/23/22 1922    diltiazem (CARDIZEM CD) 120 MG 24 hr capsule  Daily        04/23/22 1922    Amb Referral to AFIB Clinic        04/23/22 Cyndi Bender, MD 04/23/22 [id number]    Malvin Johns, MD 04/23/22 [phone]

## 2022-04-24 ENCOUNTER — Encounter (HOSPITAL_COMMUNITY): Payer: Self-pay

## 2022-04-24 ENCOUNTER — Other Ambulatory Visit: Payer: Self-pay

## 2022-04-24 ENCOUNTER — Emergency Department (HOSPITAL_COMMUNITY): Payer: BC Managed Care – PPO

## 2022-04-24 ENCOUNTER — Inpatient Hospital Stay (HOSPITAL_COMMUNITY)
Admission: EM | Admit: 2022-04-24 | Discharge: 2022-04-26 | DRG: 310 | Disposition: A | Discharge status: Home / Self Care | Payer: BC Managed Care – PPO | Attending: Internal Medicine | Admitting: Internal Medicine

## 2022-04-24 DIAGNOSIS — Z87442 Personal history of urinary calculi: Secondary | ICD-10-CM

## 2022-04-24 DIAGNOSIS — R079 Chest pain, unspecified: Secondary | ICD-10-CM | POA: Diagnosis present

## 2022-04-24 DIAGNOSIS — E876 Hypokalemia: Secondary | ICD-10-CM | POA: Diagnosis present

## 2022-04-24 DIAGNOSIS — I4819 Other persistent atrial fibrillation: Principal | ICD-10-CM | POA: Diagnosis present

## 2022-04-24 DIAGNOSIS — I480 Paroxysmal atrial fibrillation: Secondary | ICD-10-CM | POA: Diagnosis present

## 2022-04-24 DIAGNOSIS — R0789 Other chest pain: Secondary | ICD-10-CM | POA: Diagnosis present

## 2022-04-24 DIAGNOSIS — F410 Panic disorder [episodic paroxysmal anxiety] without agoraphobia: Secondary | ICD-10-CM | POA: Diagnosis present

## 2022-04-24 DIAGNOSIS — Z87891 Personal history of nicotine dependence: Secondary | ICD-10-CM

## 2022-04-24 DIAGNOSIS — Z79899 Other long term (current) drug therapy: Secondary | ICD-10-CM

## 2022-04-24 DIAGNOSIS — I10 Essential (primary) hypertension: Secondary | ICD-10-CM | POA: Diagnosis present

## 2022-04-24 DIAGNOSIS — I4891 Unspecified atrial fibrillation: Secondary | ICD-10-CM | POA: Diagnosis not present

## 2022-04-24 DIAGNOSIS — E860 Dehydration: Secondary | ICD-10-CM | POA: Diagnosis present

## 2022-04-24 DIAGNOSIS — I959 Hypotension, unspecified: Secondary | ICD-10-CM | POA: Diagnosis not present

## 2022-04-24 DIAGNOSIS — T45516A Underdosing of anticoagulants, initial encounter: Secondary | ICD-10-CM | POA: Diagnosis present

## 2022-04-24 DIAGNOSIS — Z888 Allergy status to other drugs, medicaments and biological substances status: Secondary | ICD-10-CM

## 2022-04-24 DIAGNOSIS — Z91030 Bee allergy status: Secondary | ICD-10-CM

## 2022-04-24 DIAGNOSIS — Z7901 Long term (current) use of anticoagulants: Secondary | ICD-10-CM

## 2022-04-24 DIAGNOSIS — Z91128 Patient's intentional underdosing of medication regimen for other reason: Secondary | ICD-10-CM

## 2022-04-24 LAB — COMPREHENSIVE METABOLIC PANEL
ALT: 18 U/L (ref 0–44)
AST: 20 U/L (ref 15–41)
Albumin: 4 g/dL (ref 3.5–5.0)
Alkaline Phosphatase: 51 U/L (ref 38–126)
Anion gap: 7 (ref 5–15)
BUN: 14 mg/dL (ref 6–20)
CO2: 24 mmol/L (ref 22–32)
Calcium: 9 mg/dL (ref 8.9–10.3)
Chloride: 110 mmol/L (ref 98–111)
Creatinine, Ser: 1.08 mg/dL (ref 0.61–1.24)
GFR, Estimated: >60 mL/min (ref >60)
Glucose, Bld: 90 mg/dL (ref 70–99)
Potassium: 3.9 mmol/L (ref 3.5–5.1)
Sodium: 141 mmol/L (ref 135–145)
Total Bilirubin: 1.3 mg/dL — ABNORMAL HIGH (ref 0.3–1.2)
Total Protein: 6.7 g/dL (ref 6.5–8.1)

## 2022-04-24 LAB — CBC WITH DIFFERENTIAL/PLATELET
Abs Immature Granulocytes: 0.02 10*3/uL (ref 0.00–0.07)
Basophils Absolute: 0.1 10*3/uL (ref 0.0–0.1)
Basophils Relative: 1 %
Eosinophils Absolute: 0.3 10*3/uL (ref 0.0–0.5)
Eosinophils Relative: 3 %
HCT: 46.1 % (ref 39.0–52.0)
Hemoglobin: 15.9 g/dL (ref 13.0–17.0)
Immature Granulocytes: 0 %
Lymphocytes Relative: 28 %
Lymphs Abs: 2.3 10*3/uL (ref 0.7–4.0)
MCH: 32.4 pg (ref 26.0–34.0)
MCHC: 34.5 g/dL (ref 30.0–36.0)
MCV: 93.9 fL (ref 80.0–100.0)
Monocytes Absolute: 0.6 10*3/uL (ref 0.1–1.0)
Monocytes Relative: 7 %
Neutro Abs: 4.9 10*3/uL (ref 1.7–7.7)
Neutrophils Relative %: 61 %
Platelets: 202 10*3/uL (ref 150–400)
RBC: 4.91 MIL/uL (ref 4.22–5.81)
RDW: 12.2 % (ref 11.5–15.5)
WBC: 8.2 10*3/uL (ref 4.0–10.5)
nRBC: 0 % (ref 0.0–0.2)

## 2022-04-24 LAB — MAGNESIUM: Magnesium: 1.8 mg/dL (ref 1.7–2.4)

## 2022-04-24 LAB — BRAIN NATRIURETIC PEPTIDE: B Natriuretic Peptide: 291.3 pg/mL — ABNORMAL HIGH (ref 0.0–100.0)

## 2022-04-24 LAB — TROPONIN I (HIGH SENSITIVITY): Troponin I (High Sensitivity): 3 ng/L (ref <18)

## 2022-04-24 MED ORDER — DILTIAZEM LOAD VIA INFUSION
20.0000 mg | Freq: Once | INTRAVENOUS | Status: AC
Start: 2022-04-24 — End: 2022-04-24
  Administered 2022-04-24: 20 mg via INTRAVENOUS
  Filled 2022-04-24: qty 20

## 2022-04-24 MED ORDER — ONDANSETRON HCL 4 MG/2ML IJ SOLN
4.0000 mg | Freq: Four times a day (QID) | INTRAMUSCULAR | Status: DC | PRN
  Administered 2022-04-24 – 2022-04-25 (×2): 4 mg via INTRAVENOUS
  Filled 2022-04-24 (×2): qty 2

## 2022-04-24 MED ORDER — SODIUM CHLORIDE 0.9 % IV SOLN: INTRAVENOUS | Status: DC

## 2022-04-24 MED ORDER — HYDROCODONE-ACETAMINOPHEN 5-325 MG PO TABS: 1.0000 | ORAL_TABLET | ORAL | Status: DC | PRN

## 2022-04-24 MED ORDER — DILTIAZEM HCL-DEXTROSE 125-5 MG/125ML-% IV SOLN (PREMIX)
5.0000 mg/h | INTRAVENOUS | Status: DC
  Administered 2022-04-24 – 2022-04-25 (×2): 5 mg/h via INTRAVENOUS
  Administered 2022-04-25: 10 mg/h via INTRAVENOUS
  Administered 2022-04-26: 12.5 mg/h via INTRAVENOUS
  Filled 2022-04-24 (×5): qty 125

## 2022-04-24 MED ORDER — ACETAMINOPHEN 325 MG PO TABS: 650.0000 mg | ORAL_TABLET | Freq: Four times a day (QID) | ORAL | Status: DC | PRN

## 2022-04-24 MED ORDER — DILTIAZEM HCL 30 MG PO TABS
30.0000 mg | ORAL_TABLET | Freq: Four times a day (QID) | ORAL | Status: DC
  Administered 2022-04-25: 30 mg via ORAL
  Filled 2022-04-24: qty 1

## 2022-04-24 MED ORDER — MAGNESIUM SULFATE 2 GM/50ML IV SOLN
2.0000 g | Freq: Once | INTRAVENOUS | Status: AC
  Administered 2022-04-24: 2 g via INTRAVENOUS
  Filled 2022-04-24: qty 50

## 2022-04-24 MED ORDER — APIXABAN 5 MG PO TABS
5.0000 mg | ORAL_TABLET | Freq: Two times a day (BID) | ORAL | Status: DC
  Administered 2022-04-25 – 2022-04-26 (×3): 5 mg via ORAL
  Filled 2022-04-24 (×3): qty 1

## 2022-04-24 MED ORDER — SODIUM CHLORIDE 0.9 % IV BOLUS
500.0000 mL | Freq: Once | INTRAVENOUS | Status: AC
  Administered 2022-04-24: 500 mL via INTRAVENOUS

## 2022-04-24 MED ORDER — ACETAMINOPHEN 650 MG RE SUPP: 650.0000 mg | Freq: Four times a day (QID) | RECTAL | Status: DC | PRN

## 2022-04-24 NOTE — H&P (Signed)
Anthony Roach OFB:[id number] DOB: 03-27-1961 DOA: 04/24/2022     PCP: System, Provider Not In   Outpatient Specialists:      Patient arrived to ER on 04/24/22 at 1846 Referred by Attending Cristie Hem, MD   Patient coming from:    home Lives   With family    Chief Complaint:   Chief Complaint  Patient presents with   Dizziness   Shortness of Breath    HPI: Anthony Roach is a 61 y.o. male with medical history significant of a.fib usually gets cardioverted, had hernia Surgery July 24    Presented with  palpitations Patient with known history of atrial fibrillation with RVR was seen yesterday for A-fib with RVR given p.o. Cardizem and discharged home came back again with dizziness and shortness of breath and palpitations was found to have heart rate in 170s and diaphoretic in triage  Yesterday consulted cardiology who felt he was ok to go  home on PO diltiazem  Was given a dose of eliquis   Reports chills, no fever No leg swelling  No etoh no tobacco  Has appointment on Thursady with a.fib clinic   Regarding pertinent Chronic problems:      HTN on Cardizem last night and eliquis     A. Fib -  - CHA2DS2 vas score  2       current  on anticoagulation with  eliquis,   -  Rate control:  on  Diltiazem,    While in ER:   Started on diltiazem drip troponin unremarkable EKG showing A-fib with RVR     CXR -  NON acute   Following Medications were ordered in ER: Medications  diltiazem (CARDIZEM) 1 mg/mL load via infusion 20 mg (20 mg Intravenous Bolus from Bag 04/24/22 1925)    And  diltiazem (CARDIZEM) 125 mg in dextrose 5% 125 mL (1 mg/mL) infusion (5 mg/hr Intravenous New Bag/Given 04/24/22 1925)  magnesium sulfate IVPB 2 g 50 mL (2 g Intravenous New Bag/Given 04/24/22 2011)       ED Triage Vitals  Enc Vitals Group     BP 04/24/22 1857 (!) 144/82     Pulse Rate 04/24/22 1857 (!) 136     Resp 04/24/22 1857 17     Temp --      Temp src --       SpO2 04/24/22 1857 99 %     Weight --      Height --      Head Circumference --      Peak Flow --      Pain Score 04/24/22 1900 0     Pain Loc --      Pain Edu? --      Excl. in [facility]? --   TMAX(24)@     _________________________________________ Significant initial  Findings: Abnormal Labs Reviewed  COMPREHENSIVE METABOLIC PANEL - Abnormal; Notable for the following components:      Result Value   Total Bilirubin 1.3 (*)    All other components within normal limits  BRAIN NATRIURETIC PEPTIDE - Abnormal; Notable for the following components:   B Natriuretic Peptide 291.3 (*)    All other components within normal limits    _________________________ Troponin  3  ECG: Ordered Personally reviewed and interpreted by me showing: HR : 156 Rhythm:  A.fib. W RVR,    no evidence of ischemic changes QTC 469    The recent clinical data is shown below. Vitals:  04/24/22 1915 04/24/22 1930 04/24/22 2000 04/24/22 2030  BP:  (!) 118/90 119/83 124/82  Pulse: 62 75 96 (!) 52  Resp: '15 18 13 14  '$ SpO2: 100% 98% 96% 98%    WBC     Component Value Date/Time   WBC 8.2 04/24/2022 1900   LYMPHSABS 2.3 04/24/2022 1900   MONOABS 0.6 04/24/2022 1900   EOSABS 0.3 04/24/2022 1900   BASOSABS 0.1 04/24/2022 1900       UA  ordered  Results for orders placed or performed during the hospital encounter of 11/30/15  Gram stain     Status: None   Collection Time: 11/30/15  8:22 PM   Specimen: Urine, Random; Other  Result Value Ref Range Status   Specimen Description URINE, RANDOM  Final   Special Requests Normal  Final   Gram Stain   Final    WBC PRESENT,BOTH PMN AND MONONUCLEAR NO ORGANISMS SEEN CYTOSPIN    Report Status 11/30/2015 FINAL  Final     _______________________________________________ Hospitalist was called for admission for A-fib with RVR  The following Work up has been ordered so far:  Orders Placed This Encounter  Procedures   DG Chest 1 View   CBC with Differential    Comprehensive metabolic panel   Brain natriuretic peptide   Magnesium   TSH   Consult to hospitalist   EKG 12-Lead     OTHER Significant initial  Findings:  labs showing:  Recent Labs  Lab 04/23/22 1747 04/24/22 1900  NA 144 141  K 4.4 3.9  CO2 25 24  GLUCOSE 105* 90  BUN 21* 14  CREATININE 1.12 1.08  CALCIUM 9.1 9.0  MG  --  1.8    Cr  stable,    Lab Results  Component Value Date   CREATININE 1.08 04/24/2022   CREATININE 1.12 04/23/2022   CREATININE 1.46 (H) 09/25/2021    Recent Labs  Lab 04/24/22 1900  AST 20  ALT 18  ALKPHOS 51  BILITOT 1.3*  PROT 6.7  ALBUMIN 4.0   Lab Results  Component Value Date   CALCIUM 9.0 04/24/2022        Plt: Lab Results  Component Value Date   PLT 202 04/24/2022     Recent Labs  Lab 04/23/22 1747 04/24/22 1900  WBC 6.4 8.2  NEUTROABS 3.8 4.9  HGB 13.9 15.9  HCT 41.4 46.1  MCV 95.6 93.9  PLT 178 202    HG/HCT  stable,       Component Value Date/Time   HGB 15.9 04/24/2022 1900   HCT 46.1 04/24/2022 1900   MCV 93.9 04/24/2022 1900     Cardiac Panel (last 3 results) No results for input(s): "CKTOTAL", "CKMB", "TROPONINI", "RELINDX" in the last 72 hours.  .car BNP (last 3 results) Recent Labs    04/24/22 1912  BNP 291.3*        Cultures:    Component Value Date/Time   SDES URINE, RANDOM 11/30/2015 2022   SPECREQUEST Normal 11/30/2015 2022   REPTSTATUS 11/30/2015 FINAL 11/30/2015 2022     Radiological Exams on Admission: DG Chest 1 View  Result Date: 04/24/2022 CLINICAL DATA:  Fatigue. EXAM: CHEST  1 VIEW COMPARISON:  05/07/2017 FINDINGS: Stable heart size.The cardiomediastinal contours are normal. The lungs are clear. Pulmonary vasculature is normal. No consolidation, pleural effusion, or pneumothorax. No acute osseous abnormalities are seen. IMPRESSION: No acute chest findings. Electronically Signed   By: Keith Rake M.D.   On: 04/24/2022 19:51  _______________________________________________________________________________________________________ Latest  Blood pressure 124/82, pulse (!) 52, resp. rate 14, SpO2 98 %.   Vitals  labs and radiology finding personally reviewed  Review of Systems:    Pertinent positives include: Palpitations  Constitutional:  No weight loss, night sweats, Fevers, chills, fatigue, weight loss  HEENT:  No headaches, Difficulty swallowing,Tooth/dental problems,Sore throat,  No sneezing, itching, ear ache, nasal congestion, post nasal drip,  Cardio-vascular:  No chest pain, Orthopnea, PND, anasarca, dizziness, palpitations.no Bilateral lower extremity swelling  GI:  No heartburn, indigestion, abdominal pain, nausea, vomiting, diarrhea, change in bowel habits, loss of appetite, melena, blood in stool, hematemesis Resp:  no shortness of breath at rest. No dyspnea on exertion, No excess mucus, no productive cough, No non-productive cough, No coughing up of blood.No change in color of mucus.No wheezing. Skin:  no rash or lesions. No jaundice GU:  no dysuria, change in color of urine, no urgency or frequency. No straining to urinate.  No flank pain.  Musculoskeletal:  No joint pain or no joint swelling. No decreased range of motion. No back pain.  Psych:  No change in mood or affect. No depression or anxiety. No memory loss.  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 He has never used smokeless tobacco. He reports current alcohol use. He reports that he does not use drugs.     Family History: a.fib in father _______________________________________________________ Allergies: Allergies  Allergen Reactions   Bee Venom Hives, Shortness Of Breath, Swelling and Other (See Comments)    Severity of reactions can vary   Dust Mite Extract Itching and Other (See Comments)    Nasal congestion, also     Prior to Admission medications   Medication Sig Start Date End Date Taking? Authorizing Provider  apixaban (ELIQUIS) 5 MG TABS tablet Take 1 tablet (5 mg total) by mouth 2 (two) times daily. 04/23/22 05/23/22  Malvin Johns, MD  diltiazem (CARDIZEM CD) 120 MG 24 hr capsule Take 1 capsule (120 mg total) by mouth daily. 04/23/22   Malvin Johns, MD  escitalopram (LEXAPRO) 20 MG tablet Take 20 mg by mouth daily.    [provider]  metoprolol succinate (TOPROL-XL) 25 MG 24 hr tablet PLEASE SEE ATTACHED FOR DETAILED DIRECTIONS 09/04/21   [provider]    ___________________________________________________________________________________________________ Physical Exam:    04/24/2022    8:30 PM 04/24/2022    8:00 PM 04/24/2022    7:30 PM  Vitals with BMI  Systolic 270 623 762  Diastolic 82 83 90  Pulse 52 96 75     1. General:  in No  Acute distress   Well  -appearing 2. Psychological: Alert and   Oriented 3. Head/ENT:    Dry Mucous Membranes                          Head Non traumatic, neck supple                           Poor Dentition 4. SKIN: decreased Skin turgor,  Skin clean Dry and intact no rash 5. Heart: Regular rate and rhythm no  Murmur, no Rub or gallop 6. Lungs: no wheezes or crackles   7. Abdomen: Soft,  non-tender, Non distended  bowel sounds present 8. Lower extremities: no clubbing, cyanosis, no  edema 9. Neurologically Grossly intact, moving all 4 extremities equally   10. MSK: Normal range of motion    Chart has been reviewed  ______________________________________________________________________________________________  Assessment/Plan 61 y.o. male with medical history significant of a.fib usually gets cardioverted  Admitted for A-fib with RVR   Present on Admission: **None**     No problem-specific Assessment & Plan notes found for this encounter.    Other plan as per orders.  DVT prophylaxis:  xarelto    Code Status:    Code Status: Not on file FULL CODE   as per patient   I had personally discussed CODE STATUS with patient  and family   Family Communication:   Family   at  Bedside  plan of care was discussed  with  Wife,    Disposition Plan:    To home once workup is complete and patient is stable  ***Following barriers for discharge:                            Electrolytes corrected                               Anemia corrected                             Pain controlled with PO medications                               Afebrile, white count improving able to transition to PO antibiotics                             Will need to be able to tolerate PO                            Will likely need home health, home O2, set up                           Will need consultants to evaluate patient prior to discharge                       Consults called: emailed cardiology    Admission status:  ED Disposition     ED Disposition  [place]: [place] [[id number]]  Level of Care:  Progressive [102]  Admit to Progressive based on following criteria: CARDIOVASCULAR & THORACIC of moderate stability with acute coronary syndrome symptoms/low risk myocardial infarction/hypertensive urgency/arrhythmias/heart failure potentially compromising stability and stable post cardiovascular intervention patients.  May place patient in observation at [facility] or [place] if equivalent level of care is available:: No  Covid Evaluation: Asymptomatic - no recent exposure (last 10 days) testing not required  Diagnosis: Atrial fibrillation with RVR [facility]) [[id number]]  Admitting Physician: Toy Baker [[id number]]  Attending Physician: Toy Baker [[id number]]           Obs     Level of care         progressive tele indefinitely please discontinue once patient no longer qualifies COVID-19 Labs     Hawa Henly [place] 04/24/2022, 10:14 PM    [facility]     after 2 AM please page floor coverage PA If 7AM-7PM, please contact the day team taking care of the patient using [email or web address]   Patient was evaluated in the context of the global COVID-19 pandemic, which necessitated consideration that the patient might be at risk for infection with the SARS-CoV-2 virus that causes COVID-19. Institutional protocols and algorithms that pertain to the evaluation of patients at risk for COVID-19 are in a state of rapid change based on information released by regulatory bodies including the CDC and federal and state organizations. These policies and algorithms were followed during the patient's care.

## 2022-04-24 NOTE — ED Triage Notes (Signed)
Pt was seen here yesterday for Afib with RVR. Pt was given PO Cardizem and discharged. Pt presents today with dizziness, SHOB, and palpitations. Pt is very pale and diaphoretic in triage.

## 2022-04-24 NOTE — ED Provider Notes (Addendum)
[facility] DEPT Provider Note  CSN: [id number] Arrival date & time: 04/24/22 1846  Chief Complaint(s) Dizziness and Shortness of Breath  HPI Anthony Roach is a 61 y.o. male with history of atrial fibrillation presenting with palpitations.  Patient reports shortness of breath, palpitations, lightheadedness, fatigue.  He reports some face pain.  Was seen in the emergency department yesterday for atrial fibrillation with RVR.  He was successfully rate controlled on diltiazem and started on Eliquis.  He reports compliance at home, but developed recurrent symptoms of palpitations, and the above associated symptoms.  Patient reports that symptoms are severe.  No chest pain.  No syncope.  No numbness or tingling.  No weakness.  No facial droop.   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 Authorizing Provider  apixaban (ELIQUIS) 5 MG TABS tablet Take 1 tablet (5 mg total) by mouth 2 (two) times daily. 04/23/22 05/23/22  Malvin Johns, MD  diltiazem (CARDIZEM CD) 120 MG 24 hr capsule Take 1 capsule (120 mg total) by mouth daily. 04/23/22   Malvin Johns, MD  escitalopram (LEXAPRO) 20 MG tablet Take 20 mg by mouth daily.    [provider]  metoprolol succinate (TOPROL-XL) 25 MG 24 hr tablet PLEASE SEE ATTACHED FOR DETAILED DIRECTIONS 09/04/21   [provider]                                                                                                                                    Past Surgical History Past Surgical History:  Procedure Laterality Date   Cysto, stents      CYSTOSCOPY W/ URETERAL STENT PLACEMENT Bilateral 03/06/2016   Procedure: CYSTOSCOPY WITH BILATERAL RETROGRADE PYELOGRAM/BILATERAL URETEROSCOPY AND BILATERAL LASER APPLICATION, BILATERAL URETERAL STENT PLACEMENT;  Surgeon: Alexis Frock, MD;  Location: [facility];  Service: Urology;  Laterality: Bilateral;   Rotator Cuff Repair Left Shoulder     Family History History reviewed. No pertinent family history.  Social History Social History   Tobacco Use   Smoking status: Former   Smokeless tobacco: Never  Substance Use Topics   Alcohol use: Yes    Comment: occ   Drug use: No   Allergies Bee venom and Dust mite extract  Review of Systems Review of Systems  All other systems reviewed and are negative.   Physical Exam Vital Signs  I have reviewed the triage vital signs BP 124/82   Pulse (!) 52   Resp 14   SpO2 98%  Physical Exam Vitals and  nursing note reviewed.  Constitutional:      General: He is not in acute distress.    Appearance: Normal appearance.  HENT:     Mouth/Throat:     Mouth: Mucous membranes are moist.  Eyes:     Conjunctiva/sclera: Conjunctivae normal.  Cardiovascular:     Rate and Rhythm: Tachycardia present. Rhythm irregular.  Pulmonary:     Effort: Pulmonary effort is normal. No respiratory distress.     Breath sounds: Normal breath sounds.  Abdominal:     General: Abdomen is flat.     Palpations: Abdomen is soft.     Tenderness: There is no abdominal tenderness.  Musculoskeletal:     Right lower leg: No edema.     Left lower leg: No edema.  Skin:    General: Skin is warm and dry.     Capillary Refill: Capillary refill takes less than 2 seconds.  Neurological:     Mental Status: He is alert and oriented to person, place, and time. Mental status is at baseline.  Psychiatric:        Mood and Affect: Mood normal.        Behavior: Behavior normal.     ED Results and Treatments Labs (all labs ordered are listed, but only abnormal results are  displayed) Labs Reviewed  COMPREHENSIVE METABOLIC PANEL - Abnormal; Notable for the following components:      Result Value   Total Bilirubin 1.3 (*)    All other components within normal limits  BRAIN NATRIURETIC PEPTIDE - Abnormal; Notable for the following components:   B Natriuretic Peptide 291.3 (*)    All other components within normal limits  CBC WITH DIFFERENTIAL/PLATELET  MAGNESIUM  TSH  ETHANOL  RAPID URINE DRUG SCREEN, HOSP PERFORMED  CK  URINALYSIS, COMPLETE (UACMP) WITH MICROSCOPIC  TROPONIN I (HIGH SENSITIVITY)  TROPONIN I (HIGH SENSITIVITY)                                                                                                                          Radiology DG Chest 1 View  Result Date: 04/24/2022 CLINICAL DATA:  Fatigue. EXAM: CHEST  1 VIEW COMPARISON:  05/07/2017 FINDINGS: Stable heart size.The cardiomediastinal contours are normal. The lungs are clear. Pulmonary vasculature is normal. No consolidation, pleural effusion, or pneumothorax. No acute osseous abnormalities are seen. IMPRESSION: No acute chest findings. Electronically Signed   By: Keith Rake M.D.   On: 04/24/2022 19:51    Pertinent labs & imaging results that were available during my care of the patient were reviewed by me and considered in my medical decision making (see MDM for details).  Medications Ordered in ED Medications  diltiazem (CARDIZEM) 1 mg/mL load via infusion 20 mg (20 mg Intravenous Bolus from Bag 04/24/22 1925)    And  diltiazem (CARDIZEM) 125 mg in dextrose 5% 125 mL (1 mg/mL) infusion (5 mg/hr Intravenous New Bag/Given 04/24/22 1925)  diltiazem (CARDIZEM) tablet 30 mg (has no administration in  time range)  magnesium sulfate IVPB 2 g 50 mL (2 g Intravenous New Bag/Given 04/24/22 2011)                                                                                                                                     Procedures .1-3 Lead EKG Interpretation  Performed by:  Cristie Hem, MD Authorized by: Cristie Hem, MD     Interpretation: abnormal     ECG rate:  160   ECG rate assessment: tachycardic     Rhythm: atrial fibrillation     Ectopy: none     Conduction: normal   .Critical Care  Performed by: Cristie Hem, MD Authorized by: Cristie Hem, MD   Critical care provider statement:    Critical care time (minutes):  30   Critical care was necessary to treat or prevent imminent or life-threatening deterioration of the following conditions:  Cardiac failure, circulatory failure and toxidrome   Critical care was time spent personally by me on the following activities:  Development of treatment plan with patient or surrogate, discussions with consultants, evaluation of patient's response to treatment, examination of patient, ordering and review of laboratory studies, ordering and review of radiographic studies, ordering and performing treatments and interventions, pulse oximetry, re-evaluation of patient's condition and review of old charts   (including critical care time)  Medical Decision Making / ED Course   MDM:  61 year old male presenting to the emergency department with palpitations.  On exam, patient with significant palpitations.  Heart rate up to 160 on my evaluation.  Otherwise well-appearing.  Given significant tachycardia, initiated on diltiazem drip.  Patient require inpatient admission as he failed outpatient trial of management with diltiazem.  He would benefit from inpatient hospitalization for echocardiogram as well given his elevated BNP although he clinically does not appear volume overloaded.  Suspect this is secondary to his atrial fibrillation with RVR.  No evidence that A-fib with RVR is due to underlying conditions such as infection, no fever or infectious symptoms.  Electrolytes reassuring.  Discussed with the hospitalist who agrees with admission.      Additional history obtained: -Additional  history obtained from family -External records from outside source obtained and reviewed including: Chart review including previous notes, labs, imaging, consultation notes   Lab Tests: -I ordered, reviewed, and interpreted labs.   The pertinent results include:   Labs Reviewed  COMPREHENSIVE METABOLIC PANEL - Abnormal; Notable for the following components:      Result Value   Total Bilirubin 1.3 (*)    All other components within normal limits  BRAIN NATRIURETIC PEPTIDE - Abnormal; Notable for the following components:   B Natriuretic Peptide 291.3 (*)    All other components within normal limits  CBC WITH DIFFERENTIAL/PLATELET  MAGNESIUM  TSH  ETHANOL  RAPID URINE DRUG SCREEN, HOSP PERFORMED  CK  URINALYSIS, COMPLETE (UACMP) WITH MICROSCOPIC  TROPONIN  I (HIGH SENSITIVITY)  TROPONIN I (HIGH SENSITIVITY)      EKG   EKG Interpretation  Date/Time:    Ventricular Rate:    PR Interval:    QRS Duration:   QT Interval:    QTC Calculation:   R Axis:     Text Interpretation:           Imaging Studies ordered: I ordered imaging studies including x-ray On my interpretation imaging demonstrates no acute process I independently visualized and interpreted imaging. I agree with the radiologist interpretation   Medicines ordered and prescription drug management: Meds ordered this encounter  Medications   AND Linked Order Group    diltiazem (CARDIZEM) 1 mg/mL load via infusion 20 mg    diltiazem (CARDIZEM) 125 mg in dextrose 5% 125 mL (1 mg/mL) infusion   magnesium sulfate IVPB 2 g 50 mL   diltiazem (CARDIZEM) tablet 30 mg    -I have reviewed the patients home medicines and have made adjustments as needed   Consultations Obtained: I requested consultation with the hospitalist,  and discussed lab and imaging findings as well as pertinent plan - they recommend: Admit   Cardiac Monitoring: The patient was maintained on a cardiac monitor.  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  Despite best efforts to proofread,  errors can occur which can change the documentation meaning.    Cristie Hem, MD 04/24/22 2121    Cristie Hem, MD 04/24/22 2122

## 2022-04-24 NOTE — Subjective & Objective (Signed)
Patient with known history of atrial fibrillation with RVR was seen yesterday for A-fib with RVR given p.o. Cardizem and discharged home came back again with dizziness and shortness of breath and palpitations was found to have heart rate in 170s and diaphoretic in triage

## 2022-04-24 NOTE — Assessment & Plan Note (Addendum)
-   Admit to step down on Cardizem drip and transition to p.o. Cardizem       CHA2D-VASC score 2    Continue on Xarelto      Check TSH      Cycle cardiac enzymes      Obtain ECHO      Cardiology consult in AM In the past states responded to cardioversion.  Duration of current event unclear will need to be on anticoagulation prior to cardioversion

## 2022-04-25 ENCOUNTER — Inpatient Hospital Stay (HOSPITAL_COMMUNITY): Payer: BC Managed Care – PPO

## 2022-04-25 DIAGNOSIS — F410 Panic disorder [episodic paroxysmal anxiety] without agoraphobia: Secondary | ICD-10-CM | POA: Diagnosis present

## 2022-04-25 DIAGNOSIS — I4891 Unspecified atrial fibrillation: Secondary | ICD-10-CM

## 2022-04-25 DIAGNOSIS — T45516A Underdosing of anticoagulants, initial encounter: Secondary | ICD-10-CM | POA: Diagnosis present

## 2022-04-25 DIAGNOSIS — E876 Hypokalemia: Secondary | ICD-10-CM

## 2022-04-25 DIAGNOSIS — R0789 Other chest pain: Secondary | ICD-10-CM | POA: Diagnosis present

## 2022-04-25 DIAGNOSIS — I10 Essential (primary) hypertension: Secondary | ICD-10-CM | POA: Diagnosis present

## 2022-04-25 DIAGNOSIS — E860 Dehydration: Secondary | ICD-10-CM | POA: Diagnosis present

## 2022-04-25 DIAGNOSIS — I959 Hypotension, unspecified: Secondary | ICD-10-CM | POA: Diagnosis not present

## 2022-04-25 DIAGNOSIS — R079 Chest pain, unspecified: Secondary | ICD-10-CM | POA: Diagnosis present

## 2022-04-25 DIAGNOSIS — Z888 Allergy status to other drugs, medicaments and biological substances status: Secondary | ICD-10-CM | POA: Diagnosis not present

## 2022-04-25 DIAGNOSIS — Z87442 Personal history of urinary calculi: Secondary | ICD-10-CM | POA: Diagnosis not present

## 2022-04-25 DIAGNOSIS — Z87891 Personal history of nicotine dependence: Secondary | ICD-10-CM | POA: Diagnosis not present

## 2022-04-25 DIAGNOSIS — Z79899 Other long term (current) drug therapy: Secondary | ICD-10-CM | POA: Diagnosis not present

## 2022-04-25 DIAGNOSIS — I4819 Other persistent atrial fibrillation: Secondary | ICD-10-CM | POA: Diagnosis present

## 2022-04-25 DIAGNOSIS — Z91128 Patient's intentional underdosing of medication regimen for other reason: Secondary | ICD-10-CM | POA: Diagnosis not present

## 2022-04-25 DIAGNOSIS — Z91030 Bee allergy status: Secondary | ICD-10-CM | POA: Diagnosis not present

## 2022-04-25 DIAGNOSIS — Z7901 Long term (current) use of anticoagulants: Secondary | ICD-10-CM | POA: Diagnosis not present

## 2022-04-25 LAB — RAPID URINE DRUG SCREEN, HOSP PERFORMED
Amphetamines: NOT DETECTED
Barbiturates: NOT DETECTED
Benzodiazepines: NOT DETECTED
Cocaine: NOT DETECTED
Opiates: NOT DETECTED
Tetrahydrocannabinol: POSITIVE — AB

## 2022-04-25 LAB — CBC
HCT: 40.1 % (ref 39.0–52.0)
Hemoglobin: 13.5 g/dL (ref 13.0–17.0)
MCH: 32.2 pg (ref 26.0–34.0)
MCHC: 33.7 g/dL (ref 30.0–36.0)
MCV: 95.7 fL (ref 80.0–100.0)
Platelets: 149 10*3/uL — ABNORMAL LOW (ref 150–400)
RBC: 4.19 MIL/uL — ABNORMAL LOW (ref 4.22–5.81)
RDW: 12.3 % (ref 11.5–15.5)
WBC: 6.5 10*3/uL (ref 4.0–10.5)
nRBC: 0 % (ref 0.0–0.2)

## 2022-04-25 LAB — ECHOCARDIOGRAM COMPLETE
Calc EF: 57.3 %
S' Lateral: 3.4 cm
Single Plane A2C EF: 60 %
Single Plane A4C EF: 57.6 %

## 2022-04-25 LAB — COMPREHENSIVE METABOLIC PANEL
ALT: 17 U/L (ref 0–44)
AST: 15 U/L (ref 15–41)
Albumin: 3.5 g/dL (ref 3.5–5.0)
Alkaline Phosphatase: 44 U/L (ref 38–126)
Anion gap: 7 (ref 5–15)
BUN: 13 mg/dL (ref 6–20)
CO2: 23 mmol/L (ref 22–32)
Calcium: 8 mg/dL — ABNORMAL LOW (ref 8.9–10.3)
Chloride: 110 mmol/L (ref 98–111)
Creatinine, Ser: 0.73 mg/dL (ref 0.61–1.24)
GFR, Estimated: >60 mL/min (ref >60)
Glucose, Bld: 87 mg/dL (ref 70–99)
Potassium: 3.4 mmol/L — ABNORMAL LOW (ref 3.5–5.1)
Sodium: 140 mmol/L (ref 135–145)
Total Bilirubin: 1.3 mg/dL — ABNORMAL HIGH (ref 0.3–1.2)
Total Protein: 6.1 g/dL — ABNORMAL LOW (ref 6.5–8.1)

## 2022-04-25 LAB — URINALYSIS, COMPLETE (UACMP) WITH MICROSCOPIC
Bilirubin Urine: NEGATIVE
Glucose, UA: NEGATIVE mg/dL
Ketones, ur: 80 mg/dL — AB
Leukocytes,Ua: NEGATIVE
Nitrite: NEGATIVE
Protein, ur: NEGATIVE mg/dL
Specific Gravity, Urine: 1.015 (ref 1.005–1.030)
pH: 6 (ref 5.0–8.0)

## 2022-04-25 LAB — CK: Total CK: 64 U/L (ref 49–397)

## 2022-04-25 LAB — TROPONIN I (HIGH SENSITIVITY)
Troponin I (High Sensitivity): 3 ng/L (ref <18)
Troponin I (High Sensitivity): 3 ng/L (ref <18)
Troponin I (High Sensitivity): 29 ng/L — ABNORMAL HIGH (ref <18)

## 2022-04-25 LAB — GLUCOSE, CAPILLARY: Glucose-Capillary: 74 mg/dL (ref 70–99)

## 2022-04-25 LAB — ETHANOL: Alcohol, Ethyl (B): <10 mg/dL (ref <10)

## 2022-04-25 LAB — D-DIMER, QUANTITATIVE: D-Dimer, Quant: 0.36 ug/mL-FEU (ref 0.00–0.50)

## 2022-04-25 LAB — HIV ANTIBODY (ROUTINE TESTING W REFLEX): HIV Screen 4th Generation wRfx: NONREACTIVE

## 2022-04-25 LAB — TSH: TSH: 3.335 u[IU]/mL (ref 0.350–4.500)

## 2022-04-25 MED ORDER — NITROGLYCERIN 0.4 MG SL SUBL
0.4000 mg | SUBLINGUAL_TABLET | SUBLINGUAL | Status: DC | PRN
  Administered 2022-04-25: 0.4 mg via SUBLINGUAL

## 2022-04-25 MED ORDER — ESCITALOPRAM OXALATE 20 MG PO TABS
20.0000 mg | ORAL_TABLET | Freq: Every day | ORAL | Status: DC
  Administered 2022-04-25: 20 mg via ORAL
  Filled 2022-04-25: qty 1

## 2022-04-25 MED ORDER — POLYETHYLENE GLYCOL 3350 17 G PO PACK
17.0000 g | PACK | Freq: Every day | ORAL | Status: DC
  Administered 2022-04-25: 17 g via ORAL
  Filled 2022-04-25: qty 1

## 2022-04-25 MED ORDER — LORAZEPAM 2 MG/ML IJ SOLN
1.0000 mg | Freq: Four times a day (QID) | INTRAMUSCULAR | Status: DC | PRN
  Administered 2022-04-26 (×2): 1 mg via INTRAVENOUS
  Filled 2022-04-25 (×2): qty 1

## 2022-04-25 MED ORDER — POTASSIUM CHLORIDE CRYS ER 20 MEQ PO TBCR
30.0000 meq | EXTENDED_RELEASE_TABLET | ORAL | Status: DC
  Administered 2022-04-25: 30 meq via ORAL
  Filled 2022-04-25: qty 1

## 2022-04-25 MED ORDER — NITROGLYCERIN 0.4 MG SL SUBL
SUBLINGUAL_TABLET | SUBLINGUAL | Status: AC
  Administered 2022-04-25: 0.4 mg
  Filled 2022-04-25: qty 3

## 2022-04-25 MED ORDER — LORAZEPAM 2 MG/ML IJ SOLN
1.0000 mg | Freq: Once | INTRAMUSCULAR | Status: AC
  Administered 2022-04-25: 1 mg via INTRAVENOUS
  Filled 2022-04-25: qty 1

## 2022-04-25 MED ORDER — POTASSIUM CHLORIDE CRYS ER 10 MEQ PO TBCR
30.0000 meq | EXTENDED_RELEASE_TABLET | ORAL | Status: AC
  Administered 2022-04-25: 30 meq via ORAL
  Filled 2022-04-25: qty 3

## 2022-04-25 MED ORDER — SODIUM CHLORIDE 0.9 % IV SOLN: INTRAVENOUS | Status: DC

## 2022-04-25 MED ORDER — DILTIAZEM HCL 25 MG/5ML IV SOLN
25.0000 mg | INTRAVENOUS | Status: AC
  Administered 2022-04-25: 25 mg via INTRAVENOUS
  Filled 2022-04-25: qty 5

## 2022-04-25 NOTE — Assessment & Plan Note (Signed)
We will gently rehydrate.  Monitor fluid status

## 2022-04-25 NOTE — Progress Notes (Signed)
Echocardiogram 2D Echocardiogram has been performed.  Fidel Levy 04/25/2022, 3:04 PM

## 2022-04-25 NOTE — Progress Notes (Signed)
  PROGRESS NOTE  Called by RN for rapid response. Patient was extremely anxious, HR into 140-170s. He was diaphoretic and shaking. Cardizem gtt increased to '10mg'$ /hr and given IV ativan '1mg'$  with improvement in HR and anxiety. Charge RN, rapid Product/process development scientist, wife in room.   Dessa Phi, DO [facility] 04/25/2022, 1:38 PM  Available via Epic secure chat 7am-7pm After these hours, please refer to coverage provider listed on [email or web address]

## 2022-04-25 NOTE — Assessment & Plan Note (Addendum)
Describes chest pressure sensation while has been having A-fib consistent with symptomatic atrial fibrillation.  Troponin negative x2 D-dimer unremarkable.  EKG nonischemic.  Supportive management Continue Eliquis

## 2022-04-25 NOTE — H&P (View-Only) (Signed)
Cardiology Consultation   Patient ID: Anthony Roach MRN: [id number]; DOB: Oct 23, 1960  Admit date: 04/24/2022 Date of Consult: 04/25/2022  PCP:  System, Provider Not In   [place] Providers Cardiologist:  New   Patient Profile:   Anthony Roach is a 61 y.o. male with a hx of paroxysmal atrial fibrillation, anxiety and recent abdominal hernia repair who is being seen 04/25/2022 for the evaluation of atrial fibrillation with rapid ventricular rate at the request of Dr. Maylene Roes.  Patient reports history of intermittent atrial fibrillation since age 63.  It was occurring every few years.  May be associated with constipation.  Remotely has seen cardiologist and was treated with digoxin.  Currently takes beta-blocker.  Previously had cardioversion in ER as well as chemically converted.  History of Present Illness:   Anthony Roach presented to emergency room 9/4 for evaluation of palpitation, dizziness and shortness of breath.  May have symptoms for past few days.  He was given Cardizem with improved heart rate.  Discussed with Dr. Caryl Comes.  Started on Cardizem CD and Eliquis with plan to follow-up in atrial fibrillation clinic 9/7.  However, yesterday he had a recurrent episode leading to further evaluation in ER.  He noted in atrial fibrillation.  He was symptomatic.  Started on IV Cardizem and admitted.  Overnight his rate improved and Cardizem was discontinued but he became tachycardic at heart rate of 150 around 7:30 AM.  He was symptomatic with elevated heart rate.  Diaphoretic with shortness of breath.  Given Cardizem load and drip.  Now his symptoms improve with heart rate in 70s.  Has underlying anxiety.  Father with history of atrial fibrillation.  Denies exertional chest pain, shortness of breath, orthopnea, PND, syncope, lower extremity edema or melena.  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    Past Surgical History:  Procedure Laterality Date   Cysto, stents     CYSTOSCOPY W/ URETERAL STENT PLACEMENT Bilateral 03/06/2016   Procedure: CYSTOSCOPY WITH BILATERAL RETROGRADE PYELOGRAM/BILATERAL URETEROSCOPY AND BILATERAL LASER APPLICATION, BILATERAL URETERAL STENT PLACEMENT;  Surgeon: Alexis Frock, MD;  Location: [facility];  Service: Urology;  Laterality: Bilateral;   Rotator Cuff Repair Left Shoulder       Inpatient Medications: Scheduled Meds:  apixaban  5 mg Oral BID   escitalopram  20 mg Oral Daily   potassium chloride  30 mEq Oral Q4H   Continuous Infusions:  sodium chloride 75 mL/hr at 04/25/22 0836   diltiazem (CARDIZEM) infusion 5 mg/hr (04/25/22 0838)   PRN Meds: acetaminophen **OR** acetaminophen, HYDROcodone-acetaminophen, nitroGLYCERIN, ondansetron (ZOFRAN) IV  Allergies:    Allergies  Allergen Reactions   Bee Venom Hives, Shortness Of Breath, Swelling and Other (See Comments)    Severity of reactions can vary   Dust Mite Extract Itching and Other (See Comments)    Nasal congestion, also    Social History:   Social History   Socioeconomic History   Marital status: Married    Spouse name: Not on file   Number of children: Not on file   Years of education: Not on file   Highest education level: Not on file  Occupational History   Not on file  Tobacco Use   Smoking status:  Former   Smokeless tobacco: Never  Substance and Sexual Activity   Alcohol use: Yes    Comment: occ   Drug use: No   Sexual activity: Yes  Other Topics Concern   Not on file  Social History Narrative   Not on file   Social Determinants of Health   Financial Resource Strain: Not on file  Food Insecurity: No Food Insecurity (04/24/2022)   Hunger Vital Sign    Worried About Running Out of Food in the Last Year: Never true    Ran  Out of Food in the Last Year: Never true  Transportation Needs: No Transportation Needs (04/24/2022)   PRAPARE - Hydrologist (Medical): No    Lack of Transportation (Non-Medical): No  Physical Activity: Not on file  Stress: Not on file  Social Connections: Not on file  Intimate Partner Violence: Not At Risk (04/25/2022)   Humiliation, Afraid, Rape, and Kick questionnaire    Fear of Current or Ex-Partner: No    Emotionally Abused: No    Physically Abused: No    Sexually Abused: No    Family History:   History reviewed. No pertinent family history.   ROS:  Please see the history of present illness.  All other ROS reviewed and negative.     Physical Exam/Data:   Vitals:   04/25/22 0936 04/25/22 1010 04/25/22 1011 04/25/22 1015  BP:  (!) 109/53    Pulse: 71 84 (!) 101 94  Resp: 16 20 (!) 25 15  Temp:      TempSrc:      SpO2: 96% 99% 99% 99%    Intake/Output Summary (Last 24 hours) at 04/25/2022 1030 Last data filed at 04/25/2022 0313 Gross per 24 hour  Intake --  Output 200 ml  Net -200 ml      09/25/2021    4:52 AM 04/14/2020    7:58 AM 04/14/2020    7:33 AM  Last 3 Weights  Weight (lbs) 235 lb 245 lb 245 lb  Weight (kg) 106.595 kg 111.131 kg 111.131 kg     There is no height or weight on file to calculate BMI.  General:  Well nourished, well developed, in no acute distress HEENT: normal Neck: no JVD Vascular: No carotid bruits; Distal pulses 2+ bilaterally Cardiac:  normal S1, S2; irregularly irregular; no murmur  Lungs:  clear to auscultation bilaterally, no wheezing, rhonchi or rales  Abd: soft, nontender, no hepatomegaly  Ext: no edema Musculoskeletal:  No deformities, BUE and BLE strength normal and equal Skin: warm and dry  Neuro:  CNs 2-12 intact, no focal abnormalities noted Psych:  Normal affect   EKG:  The EKG was personally reviewed and demonstrates: Atrial fibrillation with rapid ventricular rate Telemetry:  Telemetry was  personally reviewed and demonstrates: Atrial fibrillation with fluctuating heart rate  Relevant CV Studies: Pending echocardiogram  Laboratory Data:  High Sensitivity Troponin:   Recent Labs  Lab 04/24/22 1900 04/24/22 2112 04/25/22 0143  TROPONINIHS '3 3 3     '$ Chemistry Recent Labs  Lab 04/23/22 1747 04/24/22 1900 04/25/22 0143  NA 144 141 140  K 4.4 3.9 3.4*  CL 113* 110 110  CO2 '25 24 23  '$ GLUCOSE 105* 90 87  BUN 21* 14 13  CREATININE 1.12 1.08 0.73  CALCIUM 9.1 9.0 8.0*  MG  --  1.8  --   GFRNONAA >60 >60 >60  ANIONGAP '6 7 7    '$ Recent Labs  Lab  04/24/22 1900 04/25/22 0143  PROT 6.7 6.1*  ALBUMIN 4.0 3.5  AST 20 15  ALT 18 17  ALKPHOS 51 44  BILITOT 1.3* 1.3*   Lipids No results for input(s): "CHOL", "TRIG", "HDL", "LABVLDL", "LDLCALC", "CHOLHDL" in the last 168 hours.  Hematology Recent Labs  Lab 04/23/22 1747 04/24/22 1900 04/25/22 0143  WBC 6.4 8.2 6.5  RBC 4.33 4.91 4.19*  HGB 13.9 15.9 13.5  HCT 41.4 46.1 40.1  MCV 95.6 93.9 95.7  MCH 32.1 32.4 32.2  MCHC 33.6 34.5 33.7  RDW 12.4 12.2 12.3  PLT 178 202 149*   Thyroid  Recent Labs  Lab 04/24/22 1913  TSH 3.335    BNP Recent Labs  Lab 04/24/22 1912  BNP 291.3*    DDimer  Recent Labs  Lab 04/24/22 2315  DDIMER 0.36     Radiology/Studies:  DG Chest 1 View  Result Date: 04/24/2022 CLINICAL DATA:  Fatigue. EXAM: CHEST  1 VIEW COMPARISON:  05/07/2017 FINDINGS: Stable heart size.The cardiomediastinal contours are normal. The lungs are clear. Pulmonary vasculature is normal. No consolidation, pleural effusion, or pneumothorax. No acute osseous abnormalities are seen. IMPRESSION: No acute chest findings. Electronically Signed   By: Keith Rake M.D.   On: 04/24/2022 19:51     Assessment and Plan:   Atrial fibrillation with rapid ventricular rate -Patient with longstanding history of atrial fibrillation dating back to age 12.  His atrial fibrillation was occurring every few  years.  Previously required cardioversion as well as chemically converted.   -His past 2 episodes in few days was worst episode with severe symptoms.   -Overnight his rate improved on Cardizem and it was stopped.  However, he had current symptomatic tachycardia around 7:30 AM this morning requiring bolus of Cardizem and drip.  Currently heart rate stable at 70s.  Symptoms improved. -Patient will benefit with rhythm control strategy.  Given recurrent symptomatic tachycardia, likely he will need TEE cardioversion with +/- antiarrhythmic drug.  Questionable flecainide use.  Eventually ablation candidate.  Will review final plan with MD.  Continue Cardizem drip and Eliquis for now. -Pending echocardiogram -TSH normal  2.  Anxiety 3.  Recent abdominal hernia repair -Per primary team  4.  Hypokalemia -Supplemented -Keep potassium greater than 4   Risk Assessment/Risk Scores:   CHA2DS2-VASc Score = 0   This indicates a 0.2% annual risk of stroke. The patient's score is based upon: CHF History: 0 HTN History: 0 Diabetes History: 0 Stroke History: 0 Vascular Disease History: 0 Age Score: 0 Gender Score: 0       For questions or updates, please contact [place] Please consult [email or web address] for contact info under    SignedLeanor Kail, PA  04/25/2022 10:30 AM   Personally seen and examined. Agree with above.  61 year old male with paroxysmal atrial fibrillation rapid ventricular response longstanding dating back to age 89.  Currently irregularly irregular tachycardic.  On Cardizem drip.  We will go ahead and set him up for a cardioversion.  He has auto converted in the past.  Ultimately, I would like for him to see EP to discuss atrial fibrillation ablation.  This may be helpful to avoid antiarrhythmics.  Had abdominal hernia repair in July.  Just started Eliquis here. CHADS VASC 0.   Will set up for TEE cardioversion.   Candee Furbish, MD

## 2022-04-25 NOTE — Progress Notes (Signed)
   04/25/22 0917  Vitals  Pulse Rate 76  ECG Heart Rate 69  Resp 11  MEWS COLOR  MEWS Score Color Green  Oxygen Therapy  SpO2 (!) 87 %  MEWS Score  MEWS Temp 0  MEWS Systolic 0  MEWS Pulse 0  MEWS RR 1  MEWS LOC 0  MEWS Score 1   See previous note. Pt now on 2 L O2 [place]

## 2022-04-25 NOTE — Consult Note (Addendum)
Cardiology Consultation   Patient ID: Anthony Roach MRN: [id number]; DOB: Apr 03, 1961  Admit date: 04/24/2022 Date of Consult: 04/25/2022  PCP:  System, Provider Not In   [place] Providers Cardiologist:  New   Patient Profile:   Anthony Roach is a 61 y.o. male with a hx of paroxysmal atrial fibrillation, anxiety and recent abdominal hernia repair who is being seen 04/25/2022 for the evaluation of atrial fibrillation with rapid ventricular rate at the request of Dr. Maylene Roes.  Patient reports history of intermittent atrial fibrillation since age 42.  It was occurring every few years.  May be associated with constipation.  Remotely has seen cardiologist and was treated with digoxin.  Currently takes beta-blocker.  Previously had cardioversion in ER as well as chemically converted.  History of Present Illness:   [name] presented to emergency room 9/4 for evaluation of palpitation, dizziness and shortness of breath.  May have symptoms for past few days.  He was given Cardizem with improved heart rate.  Discussed with Dr. Caryl Comes.  Started on Cardizem CD and Eliquis with plan to follow-up in atrial fibrillation clinic 9/7.  However, yesterday he had a recurrent episode leading to further evaluation in ER.  He noted in atrial fibrillation.  He was symptomatic.  Started on IV Cardizem and admitted.  Overnight his rate improved and Cardizem was discontinued but he became tachycardic at heart rate of 150 around 7:30 AM.  He was symptomatic with elevated heart rate.  Diaphoretic with shortness of breath.  Given Cardizem load and drip.  Now his symptoms improve with heart rate in 70s.  Has underlying anxiety.  Father with history of atrial fibrillation.  Denies exertional chest pain, shortness of breath, orthopnea, PND, syncope, lower extremity edema or melena.  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    Past Surgical History:  Procedure Laterality Date   Cysto, stents     CYSTOSCOPY W/ URETERAL STENT PLACEMENT Bilateral 03/06/2016   Procedure: CYSTOSCOPY WITH BILATERAL RETROGRADE PYELOGRAM/BILATERAL URETEROSCOPY AND BILATERAL LASER APPLICATION, BILATERAL URETERAL STENT PLACEMENT;  Surgeon: Alexis Frock, MD;  Location: [facility];  Service: Urology;  Laterality: Bilateral;   Rotator Cuff Repair Left Shoulder       Inpatient Medications: Scheduled Meds:  apixaban  5 mg Oral BID   escitalopram  20 mg Oral Daily   potassium chloride  30 mEq Oral Q4H   Continuous Infusions:  sodium chloride 75 mL/hr at 04/25/22 0836   diltiazem (CARDIZEM) infusion 5 mg/hr (04/25/22 0838)   PRN Meds: acetaminophen **OR** acetaminophen, HYDROcodone-acetaminophen, nitroGLYCERIN, ondansetron (ZOFRAN) IV  Allergies:    Allergies  Allergen Reactions   Bee Venom Hives, Shortness Of Breath, Swelling and Other (See Comments)    Severity of reactions can vary   Dust Mite Extract Itching and Other (See Comments)    Nasal congestion, also    Social History:   Social History   Socioeconomic History   Marital status: Married    Spouse name: Not on file   Number of children: Not on file   Years of education: Not on file   Highest education level: Not on file  Occupational History   Not on file  Tobacco Use   Smoking status:  Former   Smokeless tobacco: Never  Substance and Sexual Activity   Alcohol use: Yes    Comment: occ   Drug use: No   Sexual activity: Yes  Other Topics Concern   Not on file  Social History Narrative   Not on file   Social Determinants of Health   Financial Resource Strain: Not on file  Food Insecurity: No Food Insecurity (04/24/2022)   Hunger Vital Sign    Worried About Running Out of Food in the Last Year: Never true    Ran  Out of Food in the Last Year: Never true  Transportation Needs: No Transportation Needs (04/24/2022)   PRAPARE - Hydrologist (Medical): No    Lack of Transportation (Non-Medical): No  Physical Activity: Not on file  Stress: Not on file  Social Connections: Not on file  Intimate Partner Violence: Not At Risk (04/25/2022)   Humiliation, Afraid, Rape, and Kick questionnaire    Fear of Current or Ex-Partner: No    Emotionally Abused: No    Physically Abused: No    Sexually Abused: No    Family History:   History reviewed. No pertinent family history.   ROS:  Please see the history of present illness.  All other ROS reviewed and negative.     Physical Exam/Data:   Vitals:   04/25/22 0936 04/25/22 1010 04/25/22 1011 04/25/22 1015  BP:  (!) 109/53    Pulse: 71 84 (!) 101 94  Resp: 16 20 (!) 25 15  Temp:      TempSrc:      SpO2: 96% 99% 99% 99%    Intake/Output Summary (Last 24 hours) at 04/25/2022 1030 Last data filed at 04/25/2022 0313 Gross per 24 hour  Intake --  Output 200 ml  Net -200 ml      09/25/2021    4:52 AM 04/14/2020    7:58 AM 04/14/2020    7:33 AM  Last 3 Weights  Weight (lbs) 235 lb 245 lb 245 lb  Weight (kg) 106.595 kg 111.131 kg 111.131 kg     There is no height or weight on file to calculate BMI.  General:  Well nourished, well developed, in no acute distress HEENT: normal Neck: no JVD Vascular: No carotid bruits; Distal pulses 2+ bilaterally Cardiac:  normal S1, S2; irregularly irregular; no murmur  Lungs:  clear to auscultation bilaterally, no wheezing, rhonchi or rales  Abd: soft, nontender, no hepatomegaly  Ext: no edema Musculoskeletal:  No deformities, BUE and BLE strength normal and equal Skin: warm and dry  Neuro:  CNs 2-12 intact, no focal abnormalities noted Psych:  Normal affect   EKG:  The EKG was personally reviewed and demonstrates: Atrial fibrillation with rapid ventricular rate Telemetry:  Telemetry was  personally reviewed and demonstrates: Atrial fibrillation with fluctuating heart rate  Relevant CV Studies: Pending echocardiogram  Laboratory Data:  High Sensitivity Troponin:   Recent Labs  Lab 04/24/22 1900 04/24/22 2112 04/25/22 0143  TROPONINIHS '3 3 3     '$ Chemistry Recent Labs  Lab 04/23/22 1747 04/24/22 1900 04/25/22 0143  NA 144 141 140  K 4.4 3.9 3.4*  CL 113* 110 110  CO2 '25 24 23  '$ GLUCOSE 105* 90 87  BUN 21* 14 13  CREATININE 1.12 1.08 0.73  CALCIUM 9.1 9.0 8.0*  MG  --  1.8  --   GFRNONAA >60 >60 >60  ANIONGAP '6 7 7    '$ Recent Labs  Lab  04/24/22 1900 04/25/22 0143  PROT 6.7 6.1*  ALBUMIN 4.0 3.5  AST 20 15  ALT 18 17  ALKPHOS 51 44  BILITOT 1.3* 1.3*   Lipids No results for input(s): "CHOL", "TRIG", "HDL", "LABVLDL", "LDLCALC", "CHOLHDL" in the last 168 hours.  Hematology Recent Labs  Lab 04/23/22 1747 04/24/22 1900 04/25/22 0143  WBC 6.4 8.2 6.5  RBC 4.33 4.91 4.19*  HGB 13.9 15.9 13.5  HCT 41.4 46.1 40.1  MCV 95.6 93.9 95.7  MCH 32.1 32.4 32.2  MCHC 33.6 34.5 33.7  RDW 12.4 12.2 12.3  PLT 178 202 149*   Thyroid  Recent Labs  Lab 04/24/22 1913  TSH 3.335    BNP Recent Labs  Lab 04/24/22 1912  BNP 291.3*    DDimer  Recent Labs  Lab 04/24/22 2315  DDIMER 0.36     Radiology/Studies:  DG Chest 1 View  Result Date: 04/24/2022 CLINICAL DATA:  Fatigue. EXAM: CHEST  1 VIEW COMPARISON:  05/07/2017 FINDINGS: Stable heart size.The cardiomediastinal contours are normal. The lungs are clear. Pulmonary vasculature is normal. No consolidation, pleural effusion, or pneumothorax. No acute osseous abnormalities are seen. IMPRESSION: No acute chest findings. Electronically Signed   By: Keith Rake M.D.   On: 04/24/2022 19:51     Assessment and Plan:   Atrial fibrillation with rapid ventricular rate -Patient with longstanding history of atrial fibrillation dating back to age 76.  His atrial fibrillation was occurring every few  years.  Previously required cardioversion as well as chemically converted.   -His past 2 episodes in few days was worst episode with severe symptoms.   -Overnight his rate improved on Cardizem and it was stopped.  However, he had current symptomatic tachycardia around 7:30 AM this morning requiring bolus of Cardizem and drip.  Currently heart rate stable at 70s.  Symptoms improved. -Patient will benefit with rhythm control strategy.  Given recurrent symptomatic tachycardia, likely he will need TEE cardioversion with +/- antiarrhythmic drug.  Questionable flecainide use.  Eventually ablation candidate.  Will review final plan with MD.  Continue Cardizem drip and Eliquis for now. -Pending echocardiogram -TSH normal  2.  Anxiety 3.  Recent abdominal hernia repair -Per primary team  4.  Hypokalemia -Supplemented -Keep potassium greater than 4   Risk Assessment/Risk Scores:   CHA2DS2-VASc Score = 0   This indicates a 0.2% annual risk of stroke. The patient's score is based upon: CHF History: 0 HTN History: 0 Diabetes History: 0 Stroke History: 0 Vascular Disease History: 0 Age Score: 0 Gender Score: 0       For questions or updates, please contact [place] Please consult [email or web address] for contact info under    SignedLeanor Kail, PA  04/25/2022 10:30 AM   Personally seen and examined. Agree with above.  61 year old male with paroxysmal atrial fibrillation rapid ventricular response longstanding dating back to age 10.  Currently irregularly irregular tachycardic.  On Cardizem drip.  We will go ahead and set him up for a cardioversion.  He has auto converted in the past.  Ultimately, I would like for him to see EP to discuss atrial fibrillation ablation.  This may be helpful to avoid antiarrhythmics.  Had abdominal hernia repair in July.  Just started Eliquis here. CHADS VASC 0.   Will set up for TEE cardioversion.   Candee Furbish, MD

## 2022-04-25 NOTE — Progress Notes (Signed)
   04/25/22 0920  Vitals  BP 100/79  MAP (mmHg) 85  Pulse Rate 76  ECG Heart Rate 74  Resp 19  MEWS COLOR  MEWS Score Color Green  Oxygen Therapy  SpO2 (!) 88 %  MEWS Score  MEWS Temp 0  MEWS Systolic 1  MEWS Pulse 0  MEWS RR 0  MEWS LOC 0  MEWS Score 1   Pt is resting well, but occasionally dropping below 90%. Placed O2 2 L [place]. Now at 98%.

## 2022-04-25 NOTE — Progress Notes (Addendum)
PROGRESS NOTE    Anthony Roach  ZOX:[id number] DOB: 1960-10-09 DOA: 04/24/2022 PCP: System, Provider Not In     Brief Narrative:  Anthony Roach is a 61 year old male with past medical history significant for A Fib who presented to the hospital with chief complaints of dizziness and shortness of breath. He was evaluated in ED 9/4 and sent home with PO diltiazem and eliquis after discussion with Dr. Caryl Comes, cardiology. He returned to ED 9/5 with dizziness, SOB, palpitations. He was given IV cardizem and admitted to hospital.   New events last 24 hours / Subjective: Called this morning by RN, patient back in A Fib RVR. He was evaluated. He admitted to chest tightness and nausea, EKG reviewed, A Fib RVR with rate 110s.  Blood pressure was stable 129/82.  Oxygen saturation 100% on room air.  He was given one time IV cardizem '25mg'$  push as well as nitro, zofran, morning potassium replacement. To resume IV cardizem drip after loading dose.   Assessment & Plan:   Principal Problem:   Atrial fibrillation with RVR (HCC) Active Problems:   Chest pain   Dehydration   A-fib RVR -Given Cardizem IV loading dose this morning, resume IV Cardizem drip -TSH normal -Continue Eliquis -Echocardiogram pending -Cardiology consulted  Chest pain -EKG without ST elevation.  Troponin negative.  D-dimer negative -Nitro SL as needed  Dehydration -IV fluid  Hypokalemia -Replace, trend  DVT prophylaxis:  apixaban (ELIQUIS) tablet 5 mg  Code Status: Full code Family Communication: No family at bedside, plan to touch base with wife when she gets to the hospital later today Disposition Plan:  Status is: Observation The patient will require care spanning > 2 midnights and should be moved to inpatient because: Remains on IV Cardizem  Consultants:  Cardiology  Procedures:  None  Antimicrobials:  Anti-infectives (From admission, onward)    None        Objective: Vitals:   04/25/22  0308 04/25/22 0650 04/25/22 0739 04/25/22 0750  BP: 100/86 107/77 131/77 129/82  Pulse: (!) 58 72 (!) 110 88  Resp: 20 20    Temp: 97.8 F (36.6 C) 98.1 F (36.7 C) 98.4 F (36.9 C)   TempSrc: Oral Oral Oral   SpO2: 100% 97% 100% 100%    Intake/Output Summary (Last 24 hours) at 04/25/2022 0800 Last data filed at 04/25/2022 8119 Gross per 24 hour  Intake --  Output 200 ml  Net -200 ml   There were no vitals filed for this visit.  Examination:  General exam: Appears anxious, uncomfortable Respiratory system: Clear to auscultation. Respiratory effort normal. No respiratory distress. No conversational dyspnea.  On room air Cardiovascular system: S1 & S2 heard, irregular rhythm, tachycardic, no pedal edema Gastrointestinal system: Abdomen is nondistended, soft and nontender.  Central nervous system: Alert and oriented. No focal neurological deficits. Speech clear.  Extremities: Symmetric in appearance  Skin: No rashes, lesions or ulcers on exposed skin  Psychiatry: Anxious appearing  Data Reviewed: I have personally reviewed following labs and imaging studies  CBC: Recent Labs  Lab 04/23/22 1747 04/24/22 1900 04/25/22 0143  WBC 6.4 8.2 6.5  NEUTROABS 3.8 4.9  --   HGB 13.9 15.9 13.5  HCT 41.4 46.1 40.1  MCV 95.6 93.9 95.7  PLT 178 202 147*   Basic Metabolic Panel: Recent Labs  Lab 04/23/22 1747 04/24/22 1900 04/25/22 0143  NA 144 141 140  K 4.4 3.9 3.4*  CL 113* 110 110  CO2 25  24 23  GLUCOSE 105* 90 87  BUN 21* 14 13  CREATININE 1.12 1.08 0.73  CALCIUM 9.1 9.0 8.0*  MG  --  1.8  --    GFR: CrCl cannot be calculated (Unknown ideal weight.). Liver Function Tests: Recent Labs  Lab 04/24/22 1900 04/25/22 0143  AST 20 15  ALT 18 17  ALKPHOS 51 44  BILITOT 1.3* 1.3*  PROT 6.7 6.1*  ALBUMIN 4.0 3.5   No results for input(s): "LIPASE", "AMYLASE" in the last 168 hours. No results for input(s): "AMMONIA" in the last 168 hours. Coagulation Profile: No  results for input(s): "INR", "PROTIME" in the last 168 hours. Cardiac Enzymes: Recent Labs  Lab 04/24/22 2112  CKTOTAL 64   BNP (last 3 results) No results for input(s): "PROBNP" in the last 8760 hours. HbA1C: No results for input(s): "HGBA1C" in the last 72 hours. CBG: No results for input(s): "GLUCAP" in the last 168 hours. Lipid Profile: No results for input(s): "CHOL", "HDL", "LDLCALC", "TRIG", "CHOLHDL", "LDLDIRECT" in the last 72 hours. Thyroid Function Tests: Recent Labs    04/24/22 1913  TSH 3.335   Anemia Panel: No results for input(s): "VITAMINB12", "FOLATE", "FERRITIN", "TIBC", "IRON", "RETICCTPCT" in the last 72 hours. Sepsis Labs: No results for input(s): "PROCALCITON", "LATICACIDVEN" in the last 168 hours.  No results found for this or any previous visit (from the past 240 hour(s)).    Radiology Studies: DG Chest 1 View  Result Date: 04/24/2022 CLINICAL DATA:  Fatigue. EXAM: CHEST  1 VIEW COMPARISON:  05/07/2017 FINDINGS: Stable heart size.The cardiomediastinal contours are normal. The lungs are clear. Pulmonary vasculature is normal. No consolidation, pleural effusion, or pneumothorax. No acute osseous abnormalities are seen. IMPRESSION: No acute chest findings. Electronically Signed   By: Keith Rake M.D.   On: 04/24/2022 19:51      Scheduled Meds:  apixaban  5 mg Oral BID   diltiazem  25 mg Intravenous STAT   potassium chloride  30 mEq Oral Q4H   Continuous Infusions:  sodium chloride 75 mL/hr at 04/25/22 0107   diltiazem (CARDIZEM) infusion Stopped (04/25/22 0459)     LOS: 0 days     Anthony Phi, DO [facility] 04/25/2022, 8:00 AM   Available via Epic secure chat 7am-7pm After these hours, please refer to coverage provider listed on [email or web address]

## 2022-04-25 NOTE — Progress Notes (Signed)
0715 Bedside report, pt reported chest pressure, unresolved since arrival. Assessed pt, & telemetry monitor. Pt was still in A-fib. Notified charge Therapist, sports. Upon return for stat vitals, pt unknowingly to RN went into restroom. Pt was in distress walking back to bed, stated chest pressure worsening, & "I feel terrible!" Pt was diaphoretic. STAT EKG performed, VS taken, & contacted Dr. Maylene Roes with results on EKG of a-fib RVR. Administered KCL 30 mEq. & Eliquis po Dr. Maylene Roes to room, verbal orders obtained for stat cardizem 25 mg IV, given. Pt became nauseated. Admin zofran. Verbal from DO to restart cardizem gtt 5 mg/hr. New IV started in left hand.  Remained with pt from onset this am, through  now, other than running for supplies. Myriam Jacobson, charge RN remained present, until pt was stabilizing with signs & symptoms. Called Butch Penny, pt's wife & updated. Provided information from this mornings assessment till now.  Pt is now resting comfortably VSS stable & within all parameters to continue Cardizem gtt @ beginning rate of 5 mg/hr.

## 2022-04-26 ENCOUNTER — Ambulatory Visit (HOSPITAL_COMMUNITY): Payer: BC Managed Care – PPO | Admitting: Nurse Practitioner

## 2022-04-26 ENCOUNTER — Encounter (HOSPITAL_COMMUNITY)
Admission: EM | Disposition: A | Discharge status: Home / Self Care | Payer: Self-pay | Source: Home / Self Care | Attending: Internal Medicine

## 2022-04-26 ENCOUNTER — Encounter (HOSPITAL_COMMUNITY): Payer: Self-pay | Admitting: Internal Medicine

## 2022-04-26 ENCOUNTER — Inpatient Hospital Stay (HOSPITAL_COMMUNITY): Payer: BC Managed Care – PPO | Admitting: Anesthesiology

## 2022-04-26 ENCOUNTER — Inpatient Hospital Stay (HOSPITAL_COMMUNITY): Payer: BC Managed Care – PPO

## 2022-04-26 DIAGNOSIS — I4891 Unspecified atrial fibrillation: Secondary | ICD-10-CM

## 2022-04-26 HISTORY — PX: TEE WITHOUT CARDIOVERSION: SHX5443

## 2022-04-26 HISTORY — PX: CARDIOVERSION: SHX1299

## 2022-04-26 LAB — BASIC METABOLIC PANEL
Anion gap: 6 (ref 5–15)
BUN: 9 mg/dL (ref 6–20)
CO2: 23 mmol/L (ref 22–32)
Calcium: 8.2 mg/dL — ABNORMAL LOW (ref 8.9–10.3)
Chloride: 110 mmol/L (ref 98–111)
Creatinine, Ser: 0.85 mg/dL (ref 0.61–1.24)
GFR, Estimated: >60 mL/min (ref >60)
Glucose, Bld: 101 mg/dL — ABNORMAL HIGH (ref 70–99)
Potassium: 3.9 mmol/L (ref 3.5–5.1)
Sodium: 139 mmol/L (ref 135–145)

## 2022-04-26 LAB — MAGNESIUM: Magnesium: 1.8 mg/dL (ref 1.7–2.4)

## 2022-04-26 LAB — PROTIME-INR
INR: 1.4 — ABNORMAL HIGH (ref 0.8–1.2)
Prothrombin Time: 16.8 seconds — ABNORMAL HIGH (ref 11.4–15.2)

## 2022-04-26 SURGERY — ECHOCARDIOGRAM, TRANSESOPHAGEAL
Anesthesia: General

## 2022-04-26 MED ORDER — DILTIAZEM HCL 30 MG PO TABS: 30.0000 mg | ORAL_TABLET | Freq: Every day | ORAL | 0 refills | Status: DC | PRN

## 2022-04-26 MED ORDER — SODIUM CHLORIDE 0.9 % IV SOLN: INTRAVENOUS | Status: DC

## 2022-04-26 MED ORDER — DEXMEDETOMIDINE (PRECEDEX) IN NS 20 MCG/5ML (4 MCG/ML) IV SYRINGE
PREFILLED_SYRINGE | INTRAVENOUS | Status: DC | PRN
  Administered 2022-04-26 (×2): 4 ug via INTRAVENOUS

## 2022-04-26 MED ORDER — PROPOFOL 10 MG/ML IV BOLUS
INTRAVENOUS | Status: DC | PRN
  Administered 2022-04-26 (×2): 20 mg via INTRAVENOUS
  Administered 2022-04-26: 30 mg via INTRAVENOUS

## 2022-04-26 MED ORDER — PROPOFOL 500 MG/50ML IV EMUL
INTRAVENOUS | Status: DC | PRN
  Administered 2022-04-26: 175 ug/kg/min via INTRAVENOUS

## 2022-04-26 NOTE — Progress Notes (Signed)
PROGRESS NOTE    SPENSER Roach  WUX:[id number] DOB: [birth year] DOA: 04/24/2022 PCP: System, Provider Not In     Brief Narrative:  [name] is a 61 year old male with past medical history significant for A Fib who presented to the hospital with chief complaints of dizziness and shortness of breath. He was evaluated in ED 9/4 and sent home with PO diltiazem and eliquis after discussion with Dr. Caryl Comes, cardiology. He returned to ED 9/5 with dizziness, SOB, palpitations. He was given IV cardizem and admitted to hospital.  On 9/8, 2 separate rapid responses were called due to A-fib RVR as well as panic attacks.  He was restarted on Cardizem drip.  Cardiology consulted and planning for cardioversion.  New events last 24 hours / Subjective: States that he had an uneventful night.  This morning, feeling anxious again due to pending procedure.  Assessment & Plan:   Principal Problem:   Atrial fibrillation with RVR (HCC) Active Problems:   Chest pain   Dehydration   Hypokalemia   A-fib RVR -TSH normal -Continue Eliquis -Cardiology consulted, planning for TEE/DCCV today -Continue Cardizem drip  Chest pain -EKG without ST elevation.  Troponin negative.  D-dimer negative -Echo gram without wall motion abnormality  Dehydration -Improved   DVT prophylaxis:  apixaban (ELIQUIS) tablet 5 mg  Code Status: Full code Family Communication: No family at bedside this morning Disposition Plan:  Status is: Inpatient Remains inpatient appropriate because: TEE/DCCV today     Consultants:  Cardiology  Procedures:  None  Antimicrobials:  Anti-infectives (From admission, onward)    None        Objective: Vitals:   04/26/22 0746 04/26/22 0829 04/26/22 1000 04/26/22 1023  BP: 115/75  109/85   Pulse: (!) 115 (!) 104  88  Resp: 16     Temp: 98.6 F (37 C)     TempSrc: Oral     SpO2: 97%       Intake/Output Summary (Last 24 hours) at 04/26/2022 1039 Last data filed  at 04/26/2022 0800 Gross per 24 hour  Intake 2068.69 ml  Output 2325 ml  Net -256.31 ml   There were no vitals filed for this visit.  Examination:  General exam: Appears anxious Respiratory system: Clear to auscultation. Respiratory effort normal. No respiratory distress. No conversational dyspnea.  On room air Cardiovascular system: S1 & S2 heard, irregular rhythm, tachycardic, no pedal edema Gastrointestinal system: Abdomen is nondistended, soft and nontender.  Central nervous system: Alert and oriented. No focal neurological deficits. Speech clear.  Extremities: Symmetric in appearance  Skin: No rashes, lesions or ulcers on exposed skin  Psychiatry: Anxious appearing  Data Reviewed: I have personally reviewed following labs and imaging studies  CBC: Recent Labs  Lab 04/23/22 1747 04/24/22 1900 04/25/22 0143  WBC 6.4 8.2 6.5  NEUTROABS 3.8 4.9  --   HGB 13.9 15.9 13.5  HCT 41.4 46.1 40.1  MCV 95.6 93.9 95.7  PLT 178 202 253*   Basic Metabolic Panel: Recent Labs  Lab 04/23/22 1747 04/24/22 1900 04/25/22 0143 04/26/22 0552  NA 144 141 140 139  K 4.4 3.9 3.4* 3.9  CL 113* 110 110 110  CO2 '25 24 23 23  '$ GLUCOSE 105* 90 87 101*  BUN 21* '14 13 9  '$ CREATININE 1.12 1.08 0.73 0.85  CALCIUM 9.1 9.0 8.0* 8.2*  MG  --  1.8  --  1.8   GFR: CrCl cannot be calculated (Unknown ideal weight.). Liver Function Tests:  Recent Labs  Lab 04/24/22 1900 04/25/22 0143  AST 20 15  ALT 18 17  ALKPHOS 51 44  BILITOT 1.3* 1.3*  PROT 6.7 6.1*  ALBUMIN 4.0 3.5   No results for input(s): "LIPASE", "AMYLASE" in the last 168 hours. No results for input(s): "AMMONIA" in the last 168 hours. Coagulation Profile: No results for input(s): "INR", "PROTIME" in the last 168 hours. Cardiac Enzymes: Recent Labs  Lab 04/24/22 2112  CKTOTAL 64   BNP (last 3 results) No results for input(s): "PROBNP" in the last 8760 hours. HbA1C: No results for input(s): "HGBA1C" in the last 72  hours. CBG: Recent Labs  Lab 04/25/22 1309  GLUCAP 74   Lipid Profile: No results for input(s): "CHOL", "HDL", "LDLCALC", "TRIG", "CHOLHDL", "LDLDIRECT" in the last 72 hours. Thyroid Function Tests: Recent Labs    04/24/22 1913  TSH 3.335   Anemia Panel: No results for input(s): "VITAMINB12", "FOLATE", "FERRITIN", "TIBC", "IRON", "RETICCTPCT" in the last 72 hours. Sepsis Labs: No results for input(s): "PROCALCITON", "LATICACIDVEN" in the last 168 hours.  No results found for this or any previous visit (from the past 240 hour(s)).    Radiology Studies: ECHOCARDIOGRAM COMPLETE  Result Date: 04/25/2022    ECHOCARDIOGRAM REPORT   Patient Name:   Anthony Roach Date of Exam: 04/25/2022 Medical Rec #:  [id number]           Height:       70.0 in Accession #:    [id number]          Weight:       235.0 lb Date of Birth:  [birth year]            BSA:          2.235 m Patient Age:    41 years            BP:           109/53 mmHg Patient Gender: M                   HR:           84 bpm. Exam Location:  Inpatient Procedure: 2D Echo, Cardiac Doppler and Color Doppler Indications:    I48.91* Unspeicified atrial fibrillation  History:        Patient has no prior history of Echocardiogram examinations.                 Arrythmias:Atrial Fibrillation.  Sonographer:    Bernadene Person RDCS Referring Phys: [place]  1. Left ventricular ejection fraction, by estimation, is 60 to 65%. The left ventricle has normal function. The left ventricle has no regional wall motion abnormalities. Left ventricular diastolic parameters are indeterminate.  2. Right ventricular systolic function is normal. The right ventricular size is normal.  3. The mitral valve is normal in structure. No evidence of mitral valve regurgitation. No evidence of mitral stenosis.  4. The aortic valve is normal in structure. Aortic valve regurgitation is not visualized. No aortic stenosis is present.  5. The inferior vena  cava is normal in size with greater than 50% respiratory variability, suggesting right atrial pressure of 3 mmHg. FINDINGS  Left Ventricle: Left ventricular ejection fraction, by estimation, is 60 to 65%. The left ventricle has normal function. The left ventricle has no regional wall motion abnormalities. The left ventricular internal cavity size was normal in size. There is  no left ventricular hypertrophy. Left ventricular diastolic parameters are indeterminate. Right Ventricle:  The right ventricular size is normal. No increase in right ventricular wall thickness. Right ventricular systolic function is normal. Left Atrium: Left atrial size was normal in size. Right Atrium: Right atrial size was normal in size. Pericardium: There is no evidence of pericardial effusion. Mitral Valve: The mitral valve is normal in structure. No evidence of mitral valve regurgitation. No evidence of mitral valve stenosis. Tricuspid Valve: The tricuspid valve is normal in structure. Tricuspid valve regurgitation is not demonstrated. No evidence of tricuspid stenosis. Aortic Valve: The aortic valve is normal in structure. Aortic valve regurgitation is not visualized. No aortic stenosis is present. Pulmonic Valve: The pulmonic valve was normal in structure. Pulmonic valve regurgitation is not visualized. No evidence of pulmonic stenosis. Aorta: The aortic root is normal in size and structure. Venous: The inferior vena cava is normal in size with greater than 50% respiratory variability, suggesting right atrial pressure of 3 mmHg. IAS/Shunts: No atrial level shunt detected by color flow Doppler.  LEFT VENTRICLE PLAX 2D LVIDd:         5.00 cm LVIDs:         3.40 cm LV PW:         0.80 cm LV IVS:        0.80 cm LVOT diam:     2.20 cm LV SV:         68 LV SV Index:   30 LVOT Area:     3.80 cm  LV Volumes (MOD) LV vol d, MOD A2C: 83.4 ml LV vol d, MOD A4C: 74.5 ml LV vol s, MOD A2C: 33.4 ml LV vol s, MOD A4C: 31.6 ml LV SV MOD A2C:     50.0  ml LV SV MOD A4C:     74.5 ml LV SV MOD BP:      45.9 ml RIGHT VENTRICLE TAPSE (M-mode): 2.2 cm LEFT ATRIUM             Index        RIGHT ATRIUM           Index LA diam:        4.10 cm 1.83 cm/m   RA Area:     16.30 cm LA Vol (A2C):   51.9 ml 23.22 ml/m  RA Volume:   40.90 ml  18.30 ml/m LA Vol (A4C):   49.0 ml 21.92 ml/m LA Biplane Vol: 54.9 ml 24.56 ml/m  AORTIC VALVE LVOT Vmax:   114.00 cm/s LVOT Vmean:  74.733 cm/s LVOT VTI:    0.178 m  AORTA Ao Root diam: 3.80 cm TRICUSPID VALVE TR Peak grad:   19.2 mmHg TR Vmax:        219.00 cm/s  SHUNTS Systemic VTI:  0.18 m Systemic Diam: 2.20 cm Kardie Tobb DO Electronically signed by Berniece Salines DO Signature Date/Time: 04/25/2022/5:16:16 PM    Final    DG Chest 1 View  Result Date: 04/24/2022 CLINICAL DATA:  Fatigue. EXAM: CHEST  1 VIEW COMPARISON:  05/07/2017 FINDINGS: Stable heart size.The cardiomediastinal contours are normal. The lungs are clear. Pulmonary vasculature is normal. No consolidation, pleural effusion, or pneumothorax. No acute osseous abnormalities are seen. IMPRESSION: No acute chest findings. Electronically Signed   By: Keith Rake M.D.   On: 04/24/2022 19:51      Scheduled Meds:  apixaban  5 mg Oral BID   escitalopram  20 mg Oral Daily   polyethylene glycol  17 g Oral Daily   Continuous Infusions:  sodium chloride 75 mL/hr  at 04/26/22 0338   diltiazem (CARDIZEM) infusion 12.5 mg/hr (04/26/22 0750)     LOS: 1 day     Dessa Phi, DO [facility] 04/26/2022, 10:39 AM   Available via Epic secure chat 7am-7pm After these hours, please refer to coverage provider listed on [email or web address]

## 2022-04-26 NOTE — Progress Notes (Addendum)
Rounding Note    Patient Name: Anthony Roach Date of Encounter: 04/26/2022  [facility] Cardiologist: Candee Furbish, MD new  Subjective   Feels much better today. This episode was the worst one he has ever had. Would like to see EP and discuss options. He would definitely consider ablation, but if insurance will not cover it, that is a significant problem  Inpatient Medications    Scheduled Meds:  apixaban  5 mg Oral BID   escitalopram  20 mg Oral Daily   polyethylene glycol  17 g Oral Daily   Continuous Infusions:  sodium chloride 75 mL/hr at 04/26/22 0338   diltiazem (CARDIZEM) infusion 12.5 mg/hr (04/26/22 0750)   PRN Meds: acetaminophen **OR** acetaminophen, HYDROcodone-acetaminophen, LORazepam, nitroGLYCERIN, ondansetron (ZOFRAN) IV   Vital Signs    Vitals:   04/26/22 0100 04/26/22 0400 04/26/22 0746 04/26/22 0829  BP: 118/84 102/63 115/75   Pulse: 81 96 (!) 115 (!) 104  Resp: '11 15 16   '$ Temp: 98.1 F (36.7 C) 98.4 F (36.9 C) 98.6 F (37 C)   TempSrc: Oral Oral Oral   SpO2: 97% 98% 97%     Intake/Output Summary (Last 24 hours) at 04/26/2022 0921 Last data filed at 04/26/2022 0800 Gross per 24 hour  Intake 2068.69 ml  Output 2325 ml  Net -256.31 ml      09/25/2021    4:52 AM 04/14/2020    7:58 AM 04/14/2020    7:33 AM  Last 3 Weights  Weight (lbs) 235 lb 245 lb 245 lb  Weight (kg) 106.595 kg 111.131 kg 111.131 kg      Telemetry    Atrial fib, RVR at times - Personally Reviewed  ECG    None today - Personally Reviewed  Physical Exam   GEN: No acute distress.   Neck: No JVD Cardiac: RRR, no murmurs, rubs, or gallops.  Respiratory: Clear to auscultation bilaterally. GI: Soft, nontender, non-distended  MS: No edema; No deformity. Neuro:  Nonfocal  Psych: Normal affect   Labs    High Sensitivity Troponin:   Recent Labs  Lab 04/24/22 1900 04/24/22 2112 04/25/22 0143 04/25/22 1015  TROPONINIHS '3 3 3 '$ 29*      Chemistry Recent Labs  Lab 04/24/22 1900 04/25/22 0143 04/26/22 0552  NA 141 140 139  K 3.9 3.4* 3.9  CL 110 110 110  CO2 '24 23 23  '$ GLUCOSE 90 87 101*  BUN '14 13 9  '$ CREATININE 1.08 0.73 0.85  CALCIUM 9.0 8.0* 8.2*  MG 1.8  --  1.8  PROT 6.7 6.1*  --   ALBUMIN 4.0 3.5  --   AST 20 15  --   ALT 18 17  --   ALKPHOS 51 44  --   BILITOT 1.3* 1.3*  --   GFRNONAA >60 >60 >60  ANIONGAP '7 7 6    '$ Lipids No results for input(s): "CHOL", "TRIG", "HDL", "LABVLDL", "LDLCALC", "CHOLHDL" in the last 168 hours.  Hematology Recent Labs  Lab 04/23/22 1747 04/24/22 1900 04/25/22 0143  WBC 6.4 8.2 6.5  RBC 4.33 4.91 4.19*  HGB 13.9 15.9 13.5  HCT 41.4 46.1 40.1  MCV 95.6 93.9 95.7  MCH 32.1 32.4 32.2  MCHC 33.6 34.5 33.7  RDW 12.4 12.2 12.3  PLT 178 202 149*   Thyroid  Recent Labs  Lab 04/24/22 1913  TSH 3.335    BNP Recent Labs  Lab 04/24/22 1912  BNP 291.3*    DDimer  Recent Labs  Lab 04/24/22 2315  DDIMER 0.36     Radiology    ECHOCARDIOGRAM COMPLETE  Result Date: 04/25/2022    ECHOCARDIOGRAM REPORT   Patient Name:   Anthony Roach Date of Exam: 04/25/2022 Medical Rec #:  [id number]           Height:       70.0 in Accession #:    [id number]          Weight:       235.0 lb Date of Birth:  04-Feb-1961            BSA:          2.235 m Patient Age:    61 years            BP:           109/53 mmHg Patient Gender: M                   HR:           84 bpm. Exam Location:  Inpatient Procedure: 2D Echo, Cardiac Doppler and Color Doppler Indications:    I48.91* Unspeicified atrial fibrillation  History:        Patient has no prior history of Echocardiogram examinations.                 Arrythmias:Atrial Fibrillation.  Sonographer:    Bernadene Person RDCS Referring Phys: [place]  1. Left ventricular ejection fraction, by estimation, is 60 to 65%. The left ventricle has normal function. The left ventricle has no regional wall motion abnormalities. Left  ventricular diastolic parameters are indeterminate.  2. Right ventricular systolic function is normal. The right ventricular size is normal.  3. The mitral valve is normal in structure. No evidence of mitral valve regurgitation. No evidence of mitral stenosis.  4. The aortic valve is normal in structure. Aortic valve regurgitation is not visualized. No aortic stenosis is present.  5. The inferior vena cava is normal in size with greater than 50% respiratory variability, suggesting right atrial pressure of 3 mmHg. FINDINGS  Left Ventricle: Left ventricular ejection fraction, by estimation, is 60 to 65%. The left ventricle has normal function. The left ventricle has no regional wall motion abnormalities. The left ventricular internal cavity size was normal in size. There is  no left ventricular hypertrophy. Left ventricular diastolic parameters are indeterminate. Right Ventricle: The right ventricular size is normal. No increase in right ventricular wall thickness. Right ventricular systolic function is normal. Left Atrium: Left atrial size was normal in size. Right Atrium: Right atrial size was normal in size. Pericardium: There is no evidence of pericardial effusion. Mitral Valve: The mitral valve is normal in structure. No evidence of mitral valve regurgitation. No evidence of mitral valve stenosis. Tricuspid Valve: The tricuspid valve is normal in structure. Tricuspid valve regurgitation is not demonstrated. No evidence of tricuspid stenosis. Aortic Valve: The aortic valve is normal in structure. Aortic valve regurgitation is not visualized. No aortic stenosis is present. Pulmonic Valve: The pulmonic valve was normal in structure. Pulmonic valve regurgitation is not visualized. No evidence of pulmonic stenosis. Aorta: The aortic root is normal in size and structure. Venous: The inferior vena cava is normal in size with greater than 50% respiratory variability, suggesting right atrial pressure of 3 mmHg.  IAS/Shunts: No atrial level shunt detected by color flow Doppler.  LEFT VENTRICLE PLAX 2D LVIDd:         5.00 cm LVIDs:  3.40 cm LV PW:         0.80 cm LV IVS:        0.80 cm LVOT diam:     2.20 cm LV SV:         68 LV SV Index:   30 LVOT Area:     3.80 cm  LV Volumes (MOD) LV vol d, MOD A2C: 83.4 ml LV vol d, MOD A4C: 74.5 ml LV vol s, MOD A2C: 33.4 ml LV vol s, MOD A4C: 31.6 ml LV SV MOD A2C:     50.0 ml LV SV MOD A4C:     74.5 ml LV SV MOD BP:      45.9 ml RIGHT VENTRICLE TAPSE (M-mode): 2.2 cm LEFT ATRIUM             Index        RIGHT ATRIUM           Index LA diam:        4.10 cm 1.83 cm/m   RA Area:     16.30 cm LA Vol (A2C):   51.9 ml 23.22 ml/m  RA Volume:   40.90 ml  18.30 ml/m LA Vol (A4C):   49.0 ml 21.92 ml/m LA Biplane Vol: 54.9 ml 24.56 ml/m  AORTIC VALVE LVOT Vmax:   114.00 cm/s LVOT Vmean:  74.733 cm/s LVOT VTI:    0.178 m  AORTA Ao Root diam: 3.80 cm TRICUSPID VALVE TR Peak grad:   19.2 mmHg TR Vmax:        219.00 cm/s  SHUNTS Systemic VTI:  0.18 m Systemic Diam: 2.20 cm Kardie Tobb DO Electronically signed by Berniece Salines DO Signature Date/Time: 04/25/2022/5:16:16 PM    Final    DG Chest 1 View  Result Date: 04/24/2022 CLINICAL DATA:  Fatigue. EXAM: CHEST  1 VIEW COMPARISON:  05/07/2017 FINDINGS: Stable heart size.The cardiomediastinal contours are normal. The lungs are clear. Pulmonary vasculature is normal. No consolidation, pleural effusion, or pneumothorax. No acute osseous abnormalities are seen. IMPRESSION: No acute chest findings. Electronically Signed   By: Keith Rake M.D.   On: 04/24/2022 19:51    Cardiac Studies   ECHO: 04/25/2022  1. Left ventricular ejection fraction, by estimation, is 60 to 65%. The  left ventricle has normal function. The left ventricle has no regional  wall motion abnormalities. Left ventricular diastolic parameters are  indeterminate.   2. Right ventricular systolic function is normal. The right ventricular  size is normal.   3. The  mitral valve is normal in structure. No evidence of mitral valve  regurgitation. No evidence of mitral stenosis.   4. The aortic valve is normal in structure. Aortic valve regurgitation is  not visualized. No aortic stenosis is present.   5. The inferior vena cava is normal in size with greater than 50%  respiratory variability, suggesting right atrial pressure of 3 mmHg.    Patient Profile     61 y.o. male  with a hx of paroxysmal atrial fibrillation, anxiety and recent abdominal hernia repair was admitted 09/05 with dizziness and SOB, Afib, RVR. Cards asked to see 04/25/2022 for the atrial fibrillation with rapid ventricular rate.   Patient reports history of intermittent atrial fibrillation since age 74.  It was occurring every few years.  May be associated with constipation.  Remotely has seen cardiologist and was treated with digoxin.  Currently takes beta-blocker.  Previously had cardioversion in ER as well as chemically converted.  Assessment & Plan  Persistent atrial fib, RVR - Rate improved on IV Cardizem, but not controlled, BP precludes up-titration of Med - for TEE/DCCV today, questions answered - has appt w/ EP MD, Keep this - has not been on long term DOAC because episodes were infrequent  - has been started on Eliquis, 3 doses so far, continue this - CHA2DS2-VASc Score = 0  The patient's score is based upon: CHF History: 0 HTN History: 0 Diabetes History: 0 Stroke History: 0 Vascular Disease History: 0 Age Score: 0 Gender Score: 0   ASSESSMENT AND PLAN: Persistent Atrial Fibrillation (ICD10:  I48.19) The patient's CHA2DS2-VASc score is 0, indicating a 0.2% annual risk of stroke.     For questions or updates, please contact [place] Please consult [email or web address] for contact info under        Signed, Rosaria Ferries, PA-C  04/26/2022, 9:21 AM    Personally seen and examined. Agree with above.  Post cardioversion.  Successful.  Hypotensive.  Stop  diltiazem drip.  Recommend Eliquis for 4 months post cardioversion.  Has upcoming appointment with EP to discuss potential ablation.  Anxious.  Would discharged with diltiazem 30 mg p.o. every 6 hours as needed if tachycardia or atrial fibrillation resumes.  OK for DC later today  Candee Furbish, MD

## 2022-04-26 NOTE — Anesthesia Preprocedure Evaluation (Signed)
Anesthesia Evaluation  Patient identified by MRN, date of birth, ID band Patient awake    Reviewed: Allergy & Precautions, H&P , NPO status , Patient's Chart, lab work & pertinent test results  History of Anesthesia Complications Negative for: history of anesthetic complications  Airway Mallampati: II  TM Distance: >3 FB Neck ROM: full    Dental   Pulmonary neg pulmonary ROS, former smoker,    Pulmonary exam normal        Cardiovascular + dysrhythmias Atrial Fibrillation  Rhythm:irregular     Neuro/Psych negative neurological ROS  negative psych ROS   GI/Hepatic negative GI ROS, Neg liver ROS,   Endo/Other  negative endocrine ROS  Renal/GU negative Renal ROS  negative genitourinary   Musculoskeletal   Abdominal   Peds  Hematology   Anesthesia Other Findings   Reproductive/Obstetrics                             Anesthesia Physical Anesthesia Plan  ASA: 3  Anesthesia Plan: MAC   Post-op Pain Management:    Induction:   PONV Risk Score and Plan:   Airway Management Planned:   Additional Equipment:   Intra-op Plan:   Post-operative Plan:   Informed Consent: I have reviewed the patients History and Physical, chart, labs and discussed the procedure including the risks, benefits and alternatives for the proposed anesthesia with the patient or authorized representative who has indicated his/her understanding and acceptance.       Plan Discussed with: Anesthesiologist  Anesthesia Plan Comments:         Anesthesia Quick Evaluation

## 2022-04-26 NOTE — Discharge Summary (Signed)
Physician Discharge Summary  DUB MACLELLAN UYQ:[id number] DOB: Dec 02, 1960 DOA: 04/24/2022  PCP: System, Provider Not In  Admit date: 04/24/2022 Discharge date: 04/26/2022  Admitted From: Home Disposition:  Home  Recommendations for Outpatient Follow-up:  Follow up with Cardiology   Discharge Condition: Stable CODE STATUS: Full  Diet recommendation: Heart healthy   Brief/Interim Summary: Anthony Roach is a 61 year old male with past medical history significant for A Fib who presented to the hospital with chief complaints of dizziness and shortness of breath. He was evaluated in ED 9/4 and sent home with PO diltiazem and eliquis after discussion with Dr. Caryl Comes, cardiology. He returned to ED 9/5 with dizziness, SOB, palpitations. He was given IV cardizem and admitted to hospital.  On 9/8, 2 separate rapid responses were called due to A-fib RVR as well as panic attacks.  He was restarted on Cardizem drip.  Cardiology consulted and planning for cardioversion. He underwent successful cardioversion and converted to normal sinus rhythm.   Discharge Diagnoses:   Principal Problem:   Atrial fibrillation with RVR (HCC) Active Problems:   Chest pain   Dehydration   Hypokalemia  A-fib RVR -TSH normal -Continue Eliquis, Cardizem prn  -Cardiology consulted, s/p TEE/DCCV 9/7 with conversion to NSR    Chest pain -EKG without ST elevation.  Troponin negative.  D-dimer negative -Echo gram without wall motion abnormality   Dehydration -Improved  Discharge Instructions  Discharge Instructions     Call MD for:  difficulty breathing, headache or visual disturbances   Complete by: As directed    Call MD for:  extreme fatigue   Complete by: As directed    Call MD for:  persistant dizziness or light-headedness   Complete by: As directed    Call MD for:  persistant nausea and vomiting   Complete by: As directed    Call MD for:  severe uncontrolled pain   Complete by: As directed     Call MD for:  temperature >100.4   Complete by: As directed    Diet - low sodium heart healthy   Complete by: As directed    Discharge instructions   Complete by: As directed    You were cared for by a hospitalist during your hospital stay. If you have any questions about your discharge medications or the care you received while you were in the hospital after you are discharged, you can call the unit and ask to speak with the hospitalist on call if the hospitalist that took care of you is not available. Once you are discharged, your primary care physician will handle any further medical issues. Please note that NO REFILLS for any discharge medications will be authorized once you are discharged, as it is imperative that you return to your primary care physician (or establish a relationship with a primary care physician if you do not have one) for your aftercare needs so that they can reassess your need for medications and monitor your lab values.   Increase activity slowly   Complete by: As directed       Allergies as of 04/26/2022       Reactions   Bee Venom Hives, Shortness Of Breath, Swelling, Other (See Comments)   Severity of reactions can vary   Dust Mite Extract Itching, Other (See Comments)   Nasal congestion, also        Medication List     STOP taking these medications    diltiazem 120 MG 24 hr capsule Commonly  known as: Cardizem CD   metoprolol succinate 25 MG 24 hr tablet Commonly known as: TOPROL-XL       TAKE these medications    apixaban 5 MG Tabs tablet Commonly known as: ELIQUIS Take 1 tablet (5 mg total) by mouth 2 (two) times daily.   diltiazem 30 MG tablet Commonly known as: Cardizem Take 1 tablet (30 mg total) by mouth daily as needed (for elevated heart rate >130).   escitalopram 20 MG tablet Commonly known as: LEXAPRO Take 20 mg by mouth daily.        Follow-up Information     Mealor, Yetta Barre, MD Follow up on 05/24/2022.   Specialty:  Cardiology Why: '@9am'$  for ablation discussion Contact information: [address] [place] [address] [phone]                Allergies  Allergen Reactions   Bee Venom Hives, Shortness Of Breath, Swelling and Other (See Comments)    Severity of reactions can vary   Dust Mite Extract Itching and Other (See Comments)    Nasal congestion, also    Consultations: Cardiology    Procedures/Studies: ECHO TEE  Result Date: 04/26/2022    TRANSESOPHOGEAL ECHO REPORT   Patient Name:   Anthony Roach Date of Exam: 04/26/2022 Medical Rec #:  [id number]           Height:       70.0 in Accession #:    [id number]          Weight:       235.0 lb Date of Birth:  12-01-1960            BSA:          2.235 m Patient Age:    29 years            BP:           93/67 mmHg Patient Gender: M                   HR:           150 bpm. Exam Location:  Inpatient Procedure: Transesophageal Echo, Cardiac Doppler and Color Doppler Indications:     Atrial fibrillation  History:         Patient has prior history of Echocardiogram examinations, most                  recent 04/25/2022. Signs/Symptoms:Chest Pain; Risk Factors:Former                  Smoker.  Sonographer:     Clayton Lefort RDCS (AE) Referring Phys:  [id number] Leanor Kail Diagnosing Phys: Oswaldo Milian MD PROCEDURE: After discussion of the risks and benefits of a TEE, an informed consent was obtained from the patient. The transesophogeal probe was passed without difficulty through the esophogus of the patient. Sedation performed by different physician. The patient was monitored while under deep sedation. Anesthestetic sedation was provided intravenously by Anesthesiology: 597.'63mg'$  of Propofol. Image quality was good. The patient developed no complications during the procedure. A successful direct current cardioversion was performed at 200 joules with 1 attempt. IMPRESSIONS  1. Left ventricular ejection fraction, by estimation, is 50 to 55%.  The left ventricle has low normal function.  2. Right ventricular systolic function is normal. The right ventricular size is normal.  3. No left atrial/left atrial appendage thrombus was detected.  4. The mitral valve is normal in structure. Trivial  mitral valve regurgitation.  5. The aortic valve is tricuspid. Aortic valve regurgitation is not visualized.  6. Aortic dilatation noted. There is mild dilatation of the ascending aorta, measuring 38 mm. Conclusion(s)/Recommendation(s): No LA/LAA thrombus identified. Successful cardioversion performed with restoration of normal sinus rhythm. FINDINGS  Left Ventricle: Left ventricular ejection fraction, by estimation, is 50 to 55%. The left ventricle has low normal function. The left ventricular internal cavity size was normal in size. Right Ventricle: The right ventricular size is normal. No increase in right ventricular wall thickness. Right ventricular systolic function is normal. Left Atrium: Left atrial size was normal in size. No left atrial/left atrial appendage thrombus was detected. Right Atrium: Right atrial size was normal in size. Prominent Eustachian valve. Pericardium: There is no evidence of pericardial effusion. Mitral Valve: The mitral valve is normal in structure. Trivial mitral valve regurgitation. Tricuspid Valve: The tricuspid valve is normal in structure. Tricuspid valve regurgitation is trivial. Aortic Valve: The aortic valve is tricuspid. Aortic valve regurgitation is not visualized. Pulmonic Valve: The pulmonic valve was grossly normal. Pulmonic valve regurgitation is not visualized. Aorta: Aortic dilatation noted. There is mild dilatation of the ascending aorta, measuring 38 mm. IAS/Shunts: No atrial level shunt detected by color flow Doppler. Oswaldo Milian MD Electronically signed by Oswaldo Milian MD Signature Date/Time: 04/26/2022/2:32:45 PM    Final    ECHOCARDIOGRAM COMPLETE  Result Date: 04/25/2022    ECHOCARDIOGRAM REPORT    Patient Name:   Anthony Roach Date of Exam: 04/25/2022 Medical Rec #:  [id number]           Height:       70.0 in Accession #:    [id number]          Weight:       235.0 lb Date of Birth:  03/23/1961            BSA:          2.235 m Patient Age:    89 years            BP:           109/53 mmHg Patient Gender: M                   HR:           84 bpm. Exam Location:  Inpatient Procedure: 2D Echo, Cardiac Doppler and Color Doppler Indications:    I48.91* Unspeicified atrial fibrillation  History:        Patient has no prior history of Echocardiogram examinations.                 Arrythmias:Atrial Fibrillation.  Sonographer:    Bernadene Person RDCS Referring Phys: [place]  1. Left ventricular ejection fraction, by estimation, is 60 to 65%. The left ventricle has normal function. The left ventricle has no regional wall motion abnormalities. Left ventricular diastolic parameters are indeterminate.  2. Right ventricular systolic function is normal. The right ventricular size is normal.  3. The mitral valve is normal in structure. No evidence of mitral valve regurgitation. No evidence of mitral stenosis.  4. The aortic valve is normal in structure. Aortic valve regurgitation is not visualized. No aortic stenosis is present.  5. The inferior vena cava is normal in size with greater than 50% respiratory variability, suggesting right atrial pressure of 3 mmHg. FINDINGS  Left Ventricle: Left ventricular ejection fraction, by estimation, is 60 to 65%. The left ventricle has normal function. The  left ventricle has no regional wall motion abnormalities. The left ventricular internal cavity size was normal in size. There is  no left ventricular hypertrophy. Left ventricular diastolic parameters are indeterminate. Right Ventricle: The right ventricular size is normal. No increase in right ventricular wall thickness. Right ventricular systolic function is normal. Left Atrium: Left atrial size was normal  in size. Right Atrium: Right atrial size was normal in size. Pericardium: There is no evidence of pericardial effusion. Mitral Valve: The mitral valve is normal in structure. No evidence of mitral valve regurgitation. No evidence of mitral valve stenosis. Tricuspid Valve: The tricuspid valve is normal in structure. Tricuspid valve regurgitation is not demonstrated. No evidence of tricuspid stenosis. Aortic Valve: The aortic valve is normal in structure. Aortic valve regurgitation is not visualized. No aortic stenosis is present. Pulmonic Valve: The pulmonic valve was normal in structure. Pulmonic valve regurgitation is not visualized. No evidence of pulmonic stenosis. Aorta: The aortic root is normal in size and structure. Venous: The inferior vena cava is normal in size with greater than 50% respiratory variability, suggesting right atrial pressure of 3 mmHg. IAS/Shunts: No atrial level shunt detected by color flow Doppler.  LEFT VENTRICLE PLAX 2D LVIDd:         5.00 cm LVIDs:         3.40 cm LV PW:         0.80 cm LV IVS:        0.80 cm LVOT diam:     2.20 cm LV SV:         68 LV SV Index:   30 LVOT Area:     3.80 cm  LV Volumes (MOD) LV vol d, MOD A2C: 83.4 ml LV vol d, MOD A4C: 74.5 ml LV vol s, MOD A2C: 33.4 ml LV vol s, MOD A4C: 31.6 ml LV SV MOD A2C:     50.0 ml LV SV MOD A4C:     74.5 ml LV SV MOD BP:      45.9 ml RIGHT VENTRICLE TAPSE (M-mode): 2.2 cm LEFT ATRIUM             Index        RIGHT ATRIUM           Index LA diam:        4.10 cm 1.83 cm/m   RA Area:     16.30 cm LA Vol (A2C):   51.9 ml 23.22 ml/m  RA Volume:   40.90 ml  18.30 ml/m LA Vol (A4C):   49.0 ml 21.92 ml/m LA Biplane Vol: 54.9 ml 24.56 ml/m  AORTIC VALVE LVOT Vmax:   114.00 cm/s LVOT Vmean:  74.733 cm/s LVOT VTI:    0.178 m  AORTA Ao Root diam: 3.80 cm TRICUSPID VALVE TR Peak grad:   19.2 mmHg TR Vmax:        219.00 cm/s  SHUNTS Systemic VTI:  0.18 m Systemic Diam: 2.20 cm Kardie Tobb DO Electronically signed by Berniece Salines DO  Signature Date/Time: 04/25/2022/5:16:16 PM    Final    DG Chest 1 View  Result Date: 04/24/2022 CLINICAL DATA:  Fatigue. EXAM: CHEST  1 VIEW COMPARISON:  05/07/2017 FINDINGS: Stable heart size.The cardiomediastinal contours are normal. The lungs are clear. Pulmonary vasculature is normal. No consolidation, pleural effusion, or pneumothorax. No acute osseous abnormalities are seen. IMPRESSION: No acute chest findings. Electronically Signed   By: Keith Rake M.D.   On: 04/24/2022 19:51  Discharge Exam: Vitals:   04/26/22 1304 04/26/22 1459  BP: 95/73 118/85  Pulse: 69 70  Resp:  20  Temp:  98.2 F (36.8 C)  SpO2: 95% 98%    Examination morning of 9/7 General exam: Appears anxious Respiratory system: Clear to auscultation. Respiratory effort normal. No respiratory distress. No conversational dyspnea.  On room air Cardiovascular system: S1 & S2 heard, irregular rhythm, tachycardic, no pedal edema Gastrointestinal system: Abdomen is nondistended, soft and nontender.  Central nervous system: Alert and oriented. No focal neurological deficits. Speech clear.  Extremities: Symmetric in appearance  Skin: No rashes, lesions or ulcers on exposed skin  Psychiatry: Anxious appearing   The results of significant diagnostics from this hospitalization (including imaging, microbiology, ancillary and laboratory) are listed below for reference.     Microbiology: No results found for this or any previous visit (from the past 240 hour(s)).   Labs: BNP (last 3 results) Recent Labs    04/24/22 1912  BNP 371.6*   Basic Metabolic Panel: Recent Labs  Lab 04/23/22 1747 04/24/22 1900 04/25/22 0143 04/26/22 0552  NA 144 141 140 139  K 4.4 3.9 3.4* 3.9  CL 113* 110 110 110  CO2 '25 24 23 23  '$ GLUCOSE 105* 90 87 101*  BUN 21* '14 13 9  '$ CREATININE 1.12 1.08 0.73 0.85  CALCIUM 9.1 9.0 8.0* 8.2*  MG  --  1.8  --  1.8   Liver Function Tests: Recent Labs  Lab 04/24/22 1900  04/25/22 0143  AST 20 15  ALT 18 17  ALKPHOS 51 44  BILITOT 1.3* 1.3*  PROT 6.7 6.1*  ALBUMIN 4.0 3.5   No results for input(s): "LIPASE", "AMYLASE" in the last 168 hours. No results for input(s): "AMMONIA" in the last 168 hours. CBC: Recent Labs  Lab 04/23/22 1747 04/24/22 1900 04/25/22 0143  WBC 6.4 8.2 6.5  NEUTROABS 3.8 4.9  --   HGB 13.9 15.9 13.5  HCT 41.4 46.1 40.1  MCV 95.6 93.9 95.7  PLT 178 202 149*   Cardiac Enzymes: Recent Labs  Lab 04/24/22 2112  CKTOTAL 64   BNP: Invalid input(s): "POCBNP" CBG: Recent Labs  Lab 04/25/22 1309  GLUCAP 74   D-Dimer Recent Labs    04/24/22 2315  DDIMER 0.36   Hgb A1c No results for input(s): "HGBA1C" in the last 72 hours. Lipid Profile No results for input(s): "CHOL", "HDL", "LDLCALC", "TRIG", "CHOLHDL", "LDLDIRECT" in the last 72 hours. Thyroid function studies Recent Labs    04/24/22 1913  TSH 3.335   Anemia work up No results for input(s): "VITAMINB12", "FOLATE", "FERRITIN", "TIBC", "IRON", "RETICCTPCT" in the last 72 hours. Urinalysis    Component Value Date/Time   COLORURINE YELLOW 04/25/2022 1148   APPEARANCEUR CLEAR 04/25/2022 1148   LABSPEC 1.015 04/25/2022 1148   PHURINE 6.0 04/25/2022 1148   GLUCOSEU NEGATIVE 04/25/2022 1148   HGBUR SMALL (A) 04/25/2022 1148   BILIRUBINUR NEGATIVE 04/25/2022 1148   KETONESUR 80 (A) 04/25/2022 1148   PROTEINUR NEGATIVE 04/25/2022 1148   UROBILINOGEN 0.2 11/16/2007 1556   NITRITE NEGATIVE 04/25/2022 1148   LEUKOCYTESUR NEGATIVE 04/25/2022 1148   Sepsis Labs Recent Labs  Lab 04/23/22 1747 04/24/22 1900 04/25/22 0143  WBC 6.4 8.2 6.5   Microbiology No results found for this or any previous visit (from the past 240 hour(s)).   Patient was seen and examined on the day of discharge and was found to be in stable condition. Time coordinating discharge: 25 minutes including assessment and  coordination of care, as well as examination of the patient.    SIGNED:  Dessa Phi, DO [facility] 04/26/2022, 5:29 PM

## 2022-04-26 NOTE — Progress Notes (Signed)
Mobility Specialist Cancellation/Refusal Note:   Reason for Cancellation/Refusal: Pt declined mobility at this time. Pt HR at 120 during rest.  Will check back after lunch if schedule permits.      [facility]

## 2022-04-26 NOTE — Anesthesia Postprocedure Evaluation (Signed)
Anesthesia Post Note  Patient: ZAMAR ODWYER  Procedure(s) Performed: TRANSESOPHAGEAL ECHOCARDIOGRAM (TEE) CARDIOVERSION     Patient location during evaluation: Endoscopy Anesthesia Type: MAC Level of consciousness: awake and alert Pain management: pain level controlled Vital Signs Assessment: post-procedure vital signs reviewed and stable Respiratory status: spontaneous breathing, nonlabored ventilation and respiratory function stable Cardiovascular status: blood pressure returned to baseline and stable Postop Assessment: no apparent nausea or vomiting Anesthetic complications: no   No notable events documented.  Last Vitals:  Vitals:   04/26/22 1253 04/26/22 1304  BP: 94/73 95/73  Pulse: 70 69  Resp:    Temp:    SpO2: 98% 95%    Last Pain:  Vitals:   04/26/22 1242  TempSrc:   PainSc: 6                  Lidia Collum

## 2022-04-26 NOTE — Transfer of Care (Signed)
Immediate Anesthesia Transfer of Care Note  Patient: Anthony Roach  Procedure(s) Performed: TRANSESOPHAGEAL ECHOCARDIOGRAM (TEE) CARDIOVERSION  Patient Location: Endoscopy Unit  Anesthesia Type:MAC  Level of Consciousness: awake, drowsy and patient cooperative  Airway & Oxygen Therapy: Patient connected to nasal cannula oxygen  Post-op Assessment: Report given to RN, Post -op Vital signs reviewed and stable and Patient moving all extremities X 4  Post vital signs: Reviewed and stable  Last Vitals:  Vitals Value Taken Time  BP    Temp    Pulse    Resp    SpO2      Last Pain:  Vitals:   04/26/22 1128  TempSrc: Temporal  PainSc: 0-No pain      Patients Stated Pain Goal: 7 (04/03/47 1856)  Complications: No notable events documented.

## 2022-04-26 NOTE — Progress Notes (Signed)
  Echocardiogram Echocardiogram Transesophageal has been performed.  Anthony Roach 04/26/2022, 1:00 PM

## 2022-04-26 NOTE — Progress Notes (Signed)
AVS and discharge instructions reviewed w/ patient. Patient verbalized understanding and had no further questions. 

## 2022-04-26 NOTE — Interval H&P Note (Signed)
History and Physical Interval Note:  04/26/2022 11:31 AM  Anthony Roach  has presented today for surgery, with the diagnosis of atrial fibrillation.  The various methods of treatment have been discussed with the patient and family. After consideration of risks, benefits and other options for treatment, the patient has consented to  Procedure(s): TRANSESOPHAGEAL ECHOCARDIOGRAM (TEE) (N/A) CARDIOVERSION (N/A) as a surgical intervention.  The patient's history has been reviewed, patient examined, no change in status, stable for surgery.  I have reviewed the patient's chart and labs.  Questions were answered to the patient's satisfaction.     Donato Heinz

## 2022-04-26 NOTE — CV Procedure (Signed)
   TRANSESOPHAGEAL ECHOCARDIOGRAM GUIDED DIRECT CURRENT CARDIOVERSION  NAME:  Anthony Roach   MRN: [id number] DOB:  29-Nov-1960   ADMIT DATE: 04/24/2022  INDICATIONS: Symptomatic atrial fibrillation  PROCEDURE:   Informed consent was obtained prior to the procedure. The risks, benefits and alternatives for the procedure were discussed and the patient comprehended these risks.  Risks include, but are not limited to, cough, sore throat, vomiting, nausea, somnolence, esophageal and stomach trauma or perforation, bleeding, low blood pressure, aspiration, pneumonia, infection, trauma to the teeth and death.    After a procedural time-out, the oropharynx was anesthetized and the patient was sedated by the anesthesia service. The transesophageal probe was inserted in the esophagus and stomach without difficulty and multiple views were obtained. Anesthesia was monitored by Leane Platt, CRNA.   COMPLICATIONS:    Complications: No complications  FINDINGS:  No LAA thrombus   CARDIOVERSION:     Indications:  Symptomatic Atrial Fibrillation  Procedure Details:  Once the TEE was complete, the patient had the defibrillator pads placed in the anterior and posterior position. Once an appropriate level of sedation was confirmed, the patient was cardioverted x 1 with 200J of biphasic synchronized energy.  The patient converted to NSR.  There were no apparent complications.  The patient had normal neuro status and respiratory status post procedure with vitals stable as recorded elsewhere.  Adequate airway was maintained throughout and vital signs monitored per protocol.  Oswaldo Milian MD [facility]  [address], [place], [place] [address] [phone]   1:04 PM

## 2022-04-29 ENCOUNTER — Encounter (HOSPITAL_COMMUNITY): Payer: Self-pay | Admitting: Cardiology

## 2022-05-23 NOTE — Progress Notes (Deleted)
Cardiology Office Note:    Date:  05/23/2022   ID:  Anthony Roach, DOB 1961/06/30, MRN [id number]  PCP:  System, Provider Not In   [place] Prov[facility] Cardiologist:  Candee Furbish, MD Electrophysiologist:  Melida Quitter, MD { Click to update primary MD,subspecialty MD or APP then REFRESH:1}    Referring MD: Leanor Kail, PA   Chief complaint: ***  History of Present Illness:    Anthony Roach is a 61 y.o. male with a hx of Stevens-Johnson syndrome, for lithiasis, and atrial fibrillation.   He had a few ER visits for atrial fibrillation over the years.  Orts that he was initially diagnosed at about age 49.  It initially occurred 3 few years.  Has been treated in the past with digoxin, and more recently beta-blocker.  Episodes appear to be increasing in frequency. He has had multiple ER visits this year -- 2 just recently. He underwent DC cardioversion on September seventh.  His rates are oftentimes very fast in atrial fibrillation, with rates documented in the 150s.  He is very symptomatic with shortness of breath and diaphoresis.  He was discharged on diltiazem and Eliquis.   Arrhythmia History:  DC cardioversion 04/26/2002   Past Medical History:  Diagnosis Date   A-fib [facility])    Anxiety    Dysrhythmia    A Fib/Paroxysmal Atrial Fibrilation   Renal disorder    kidney stones   Stevens-Johnson syndrome ([place]) 2005    Past Surgical History:  Procedure Laterality Date   CARDIOVERSION N/A 04/26/2022   Procedure: CARDIOVERSION;  Surgeon: Donato Heinz, MD;  Location: [facility] ENDOSCOPY;  Service: Cardiovascular;  Laterality: N/A;   Cysto, stents     CYSTOSCOPY W/ URETERAL STENT PLACEMENT Bilateral 03/06/2016   Procedure: CYSTOSCOPY WITH BILATERAL RETROGRADE PYELOGRAM/BILATERAL URETEROSCOPY AND BILATERAL LASER APPLICATION, BILATERAL URETERAL STENT PLACEMENT;  Surgeon: Alexis Frock, MD;  Location: [facility] ORS;  Service: Urology;  Laterality: Bilateral;    Rotator Cuff Repair Left Shoulder     TEE WITHOUT CARDIOVERSION N/A 04/26/2022   Procedure: TRANSESOPHAGEAL ECHOCARDIOGRAM (TEE);  Surgeon: Donato Heinz, MD;  Location: [facility] ENDOSCOPY;  Service: Cardiovascular;  Laterality: N/A;    Current Medications: No outpatient medications have been marked as taking for the 05/24/22 encounter (Appointment) with Tywaun Hiltner, Yetta Barre, MD.     Allergies:   Bee venom and Dust mite extract   Social History   Socioeconomic History   Marital status: Married    Spouse name: Not on file   Number of children: Not on file   Years of education: Not on file   Highest education level: Not on file  Occupational History   Not on file  Tobacco Use   Smoking status: Former   Smokeless tobacco: Never  Substance and Sexual Activity   Alcohol use: Yes    Comment: occ   Drug use: No   Sexual activity: Yes  Other Topics Concern   Not on file  Social History Narrative   Not on file   Social Determinants of Health   Financial Resource Strain: Not on file  Food Insecurity: No Food Insecurity (04/24/2022)   Hunger Vital Sign    Worried About Running Out of Food in the Last Year: Never true    Ran Out of Food in the Last Year: Never true  Transportation Needs: No Transportation Needs (04/24/2022)   PRAPARE - Hydrologist (Medical): No    Lack of Transportation (Non-Medical): No  Physical Activity: Not on file  Stress: Not on file  Social Connections: Not on file     Family History: The patient's family history is not on file.  ROS:   Please see the history of present illness.    All other systems reviewed and are negative.  EKGs/Labs/Other Studies Reviewed:    The following studies were reviewed today:  - TTE 04/25/2022   1. Left ventricular ejection fraction, by estimation, is 60 to 65%. The  left ventricle has normal function. The left ventricle has no regional  wall motion abnormalities. Left ventricular  diastolic parameters are  indeterminate.   2. Right ventricular systolic function is normal. The right ventricular  size is normal.   3. The mitral valve is normal in structure. No evidence of mitral valve  regurgitation. No evidence of mitral stenosis.   4. The aortic valve is normal in structure. Aortic valve regurgitation is  not visualized. No aortic stenosis is present.   5. The inferior vena cava is normal in size with greater than 50%  respiratory variability, suggesting right atrial pressure of 3 mmHg.   EKG:  EKG today shows: .    Orders placed or performed during the hospital encounter of 04/24/22   EKG 12-Lead   EKG 12-Lead   EKG 12-Lead   EKG 12-Lead   EKG 12-Lead   EKG 12-Lead   EKG     Recent Labs: 04/24/2022: B Natriuretic Peptide 291.3; TSH 3.335 04/25/2022: ALT 17; Hemoglobin 13.5; Platelets 149 04/26/2022: BUN 9; Creatinine, Ser 0.85; Magnesium 1.8; Potassium 3.9; Sodium 139    Risk Assessment/Calculations:    CHA2DS2-VASc Score = 0  {Confirm score is correct.  If not, click here to update score.  REFRESH note.  :1} This indicates a 0.2% annual risk of stroke. The patient's score is based upon: CHF History: 0 HTN History: 0 Diabetes History: 0 Stroke History: 0 Vascular Disease History: 0 Age Score: 0 Gender Score: 0        Physical Exam:    VS:  There were no vitals taken for this visit.    Wt Readings from Last 3 Encounters:  09/25/21 235 lb (106.6 kg)  04/14/20 245 lb (111.1 kg)  11/13/17 235 lb (106.6 kg)     GEN: *** Well nourished, well developed in no acute distress CARDIAC: ***RRR, no murmurs, rubs, gallops RESPIRATORY:  Normal work of breathing MUSCULOSKELETAL: *** edema    ASSESSMENT & PLAN:    In order of problems listed above:  Paroxysmal af with RVR:  rates poorly controlled in AF. Very symptomatic. 2. Anxiety:  3. Hypokalemia: replaced during recent hospitalization         Medication Adjustments/Labs and Tests  Ordered: Current medicines are reviewed at length with the patient today.  Concerns regarding medicines are outlined above.  No orders of the defined types were placed in this encounter.  No orders of the defined types were placed in this encounter.    Signed, Melida Quitter, MD  05/23/2022 4:33 PM    [place]

## 2022-05-24 ENCOUNTER — Ambulatory Visit: Payer: BC Managed Care – PPO | Admitting: Cardiovascular Disease

## 2022-05-24 DIAGNOSIS — F17210 Nicotine dependence, cigarettes, uncomplicated: Secondary | ICD-10-CM | POA: Insufficient documentation

## 2022-05-24 DIAGNOSIS — F339 Major depressive disorder, recurrent, unspecified: Secondary | ICD-10-CM | POA: Insufficient documentation

## 2022-05-24 DIAGNOSIS — F4010 Social phobia, unspecified: Secondary | ICD-10-CM | POA: Insufficient documentation

## 2022-05-24 DIAGNOSIS — I4891 Unspecified atrial fibrillation: Secondary | ICD-10-CM

## 2022-06-13 ENCOUNTER — Encounter: Payer: Self-pay | Admitting: Physician Assistant

## 2022-08-24 DIAGNOSIS — Z1211 Encounter for screening for malignant neoplasm of colon: Secondary | ICD-10-CM | POA: Diagnosis not present

## 2022-08-24 DIAGNOSIS — F339 Major depressive disorder, recurrent, unspecified: Secondary | ICD-10-CM | POA: Diagnosis not present

## 2022-08-24 DIAGNOSIS — I480 Paroxysmal atrial fibrillation: Secondary | ICD-10-CM | POA: Diagnosis not present

## 2022-08-30 ENCOUNTER — Encounter (HOSPITAL_COMMUNITY): Payer: Self-pay

## 2022-08-30 ENCOUNTER — Emergency Department (HOSPITAL_COMMUNITY)
Admission: EM | Admit: 2022-08-30 | Discharge: 2022-08-31 | Disposition: A | Discharge status: Home / Self Care | Payer: BC Managed Care – PPO | Attending: Emergency Medicine | Admitting: Emergency Medicine

## 2022-08-30 ENCOUNTER — Other Ambulatory Visit: Payer: Self-pay

## 2022-08-30 DIAGNOSIS — I4891 Unspecified atrial fibrillation: Secondary | ICD-10-CM | POA: Diagnosis not present

## 2022-08-30 DIAGNOSIS — Z7901 Long term (current) use of anticoagulants: Secondary | ICD-10-CM | POA: Insufficient documentation

## 2022-08-30 DIAGNOSIS — R42 Dizziness and giddiness: Secondary | ICD-10-CM | POA: Diagnosis not present

## 2022-08-30 LAB — BASIC METABOLIC PANEL
Anion gap: 9 (ref 5–15)
BUN: 19 mg/dL (ref 6–20)
CO2: 28 mmol/L (ref 22–32)
Calcium: 10.1 mg/dL (ref 8.9–10.3)
Chloride: 100 mmol/L (ref 98–111)
Creatinine, Ser: 1.02 mg/dL (ref 0.61–1.24)
GFR, Estimated: >60 mL/min (ref >60)
Glucose, Bld: 114 mg/dL — ABNORMAL HIGH (ref 70–99)
Potassium: 4.5 mmol/L (ref 3.5–5.1)
Sodium: 137 mmol/L (ref 135–145)

## 2022-08-30 LAB — CBC WITH DIFFERENTIAL/PLATELET
Abs Immature Granulocytes: 0.01 10*3/uL (ref 0.00–0.07)
Basophils Absolute: 0.1 10*3/uL (ref 0.0–0.1)
Basophils Relative: 2 %
Eosinophils Absolute: 0.2 10*3/uL (ref 0.0–0.5)
Eosinophils Relative: 3 %
HCT: 48.1 % (ref 39.0–52.0)
Hemoglobin: 16.5 g/dL (ref 13.0–17.0)
Immature Granulocytes: 0 %
Lymphocytes Relative: 36 %
Lymphs Abs: 2.3 10*3/uL (ref 0.7–4.0)
MCH: 32.2 pg (ref 26.0–34.0)
MCHC: 34.3 g/dL (ref 30.0–36.0)
MCV: 93.8 fL (ref 80.0–100.0)
Monocytes Absolute: 0.6 10*3/uL (ref 0.1–1.0)
Monocytes Relative: 9 %
Neutro Abs: 3.2 10*3/uL (ref 1.7–7.7)
Neutrophils Relative %: 50 %
Platelets: 201 10*3/uL (ref 150–400)
RBC: 5.13 MIL/uL (ref 4.22–5.81)
RDW: 12 % (ref 11.5–15.5)
WBC: 6.4 10*3/uL (ref 4.0–10.5)
nRBC: 0 % (ref 0.0–0.2)

## 2022-08-30 LAB — MAGNESIUM: Magnesium: 2.2 mg/dL (ref 1.7–2.4)

## 2022-08-30 MED ORDER — SODIUM CHLORIDE 0.9 % IV BOLUS
1000.0000 mL | Freq: Once | INTRAVENOUS | Status: AC
  Administered 2022-08-30: 1000 mL via INTRAVENOUS

## 2022-08-30 MED ORDER — DILTIAZEM HCL ER COATED BEADS 120 MG PO CP24: 120.0000 mg | ORAL_CAPSULE | Freq: Every day | ORAL | 0 refills | Status: DC

## 2022-08-30 MED ORDER — ETOMIDATE 2 MG/ML IV SOLN: 10.0000 mg | Freq: Once | INTRAVENOUS | Status: DC

## 2022-08-30 MED ORDER — ADENOSINE 6 MG/2ML IV SOLN
INTRAVENOUS | Status: AC
  Filled 2022-08-30: qty 2

## 2022-08-30 MED ORDER — PROPOFOL 10 MG/ML IV BOLUS
60.0000 mg | Freq: Once | INTRAVENOUS | Status: AC
  Administered 2022-08-30: 60 mg via INTRAVENOUS
  Filled 2022-08-30: qty 20

## 2022-08-30 MED ORDER — SODIUM CHLORIDE 0.9 % IV SOLN: INTRAVENOUS | Status: DC

## 2022-08-30 MED ORDER — LORAZEPAM 2 MG/ML IJ SOLN
0.5000 mg | Freq: Once | INTRAMUSCULAR | Status: AC
  Administered 2022-08-30: 0.5 mg via INTRAVENOUS
  Filled 2022-08-30: qty 1

## 2022-08-30 NOTE — Discharge Instructions (Signed)
As discussed, with your history of atrial fibrillation, it is importantly follow-up with our colleagues at the [facility] clinic.  If the office does not contact you tomorrow, or Monday, please call for an appointment.  Take all medication as directed, monitor your condition carefully and do not hesitate to return here for concerning changes in your condition.

## 2022-08-30 NOTE — Sedation Documentation (Addendum)
Medication dose calculated and verified for: conscious sedation- cardioversion. Pt. Was given '60mg'$ , by provider.

## 2022-08-30 NOTE — ED Triage Notes (Addendum)
Pt reports being in afib rvr. Pt is complaining of dizziness and nausea. Pt took his Cardizem otw here.

## 2022-08-30 NOTE — Sedation Documentation (Signed)
Pt. Was cardioverted at 120j '@2230'$ . Pt. Is now is SR, 18R, 96% (2L), 22 CO2, 119/98

## 2022-08-30 NOTE — Progress Notes (Signed)
Called to pt bedside for cardioversion.  Pt tolerated procedure well.

## 2022-08-30 NOTE — ED Provider Notes (Signed)
[facility] DEPT Provider Note   CSN: [id number] Arrival date & time: 08/30/22  2119     History  Chief Complaint  Patient presents with   Dizziness   Nausea    Anthony Roach is a 62 y.o. male.  HPI Patient presents with his wife who assists with the history.  He has a history of atrial fibrillation, has been taking his Eliquis daily.  Reportedly he takes Cardizem as needed, and took a dose just prior to ED arrival, as he began feeling dyspneic, palpitations, and unsettled about 1 hour ago.  He notes multiple prior episodes of A-fib requiring cardioversion or medications.  Does not have a cardiologist, but does have a primary care physician.  He notes that has been dealing this for years, and on arrival is anxious, very uncomfortable, frustrated.    Home Medications Prior to Admission medications   Medication Sig Start Date End Date Taking? Authorizing Provider  apixaban (ELIQUIS) 5 MG TABS tablet Take 1 tablet (5 mg total) by mouth 2 (two) times daily. 04/23/22 05/23/22  Malvin Johns, MD  diltiazem (CARDIZEM CD) 120 MG 24 hr capsule Take 1 capsule (120 mg total) by mouth daily. 08/30/22 09/29/22 Yes Carmin Muskrat, MD  diltiazem (CARDIZEM) 30 MG tablet Take 1 tablet (30 mg total) by mouth daily as needed (for elevated heart rate >130). 04/26/22 05/26/22  Dessa Phi, DO  escitalopram (LEXAPRO) 20 MG tablet Take 20 mg by mouth daily.    [provider]      Allergies    Bee venom    Review of Systems   Review of Systems  All other systems reviewed and are negative.   Physical Exam Updated Vital Signs BP 90/78   Pulse 70   Temp 97.9 F (36.6 C) (Oral)   Resp 15   Ht '5\' 10"'$  (1.778 m)   Wt 93 kg   SpO2 99%   BMI 29.41 kg/m  Physical Exam Vitals and nursing note reviewed.  Constitutional:      General: He is not in acute distress.    Appearance: He is well-developed.  HENT:     Head: Normocephalic and atraumatic.  Eyes:      Conjunctiva/sclera: Conjunctivae normal.  Cardiovascular:     Rate and Rhythm: Tachycardia present. Rhythm irregular.  Pulmonary:     Effort: Pulmonary effort is normal. No respiratory distress.     Breath sounds: No stridor.  Abdominal:     General: There is no distension.  Skin:    General: Skin is warm and dry.  Neurological:     Mental Status: He is alert and oriented to person, place, and time.  Psychiatric:     Comments: Anxious     ED Results / Procedures / Treatments   Labs (all labs ordered are listed, but only abnormal results are displayed) Labs Reviewed  BASIC METABOLIC PANEL - Abnormal; Notable for the following components:      Result Value   Glucose, Bld 114 (*)    All other components within normal limits  MAGNESIUM  CBC WITH DIFFERENTIAL/PLATELET    EKG None  Radiology No results found.  Procedures .Cardioversion  Date/Time: 08/30/2022 11:04 PM  Performed by: Carmin Muskrat, MD Authorized by: Carmin Muskrat, MD   Consent:    Consent obtained:  Written   Consent given by:  Patient   Risks discussed:  Induced arrhythmia and pain   Alternatives discussed:  Delayed treatment and rate-control medication Universal protocol:  Procedure explained and questions answered to patient or proxy's satisfaction: yes     Relevant documents present and verified: yes     Test results available and properly labeled: yes     Required blood products, implants, devices, and special equipment available: yes     Site/side marked: yes     Immediately prior to procedure a time out was called: yes     Patient identity confirmed:  Verbally with patient Pre-procedure details:    Cardioversion basis:  Emergent   Rhythm:  Atrial fibrillation   Electrode placement:  Anterior-posterior Patient sedated: Yes. Refer to sedation procedure documentation for details of sedation.  Attempt one:    Cardioversion mode:  Synchronous   Waveform:  Biphasic   Shock  (Joules):  120   Shock outcome:  Conversion to normal sinus rhythm Post-procedure details:    Patient status:  Awake   Patient tolerance of procedure:  Tolerated well, no immediate complications .Sedation  Date/Time: 08/30/2022 11:05 PM  Performed by: Carmin Muskrat, MD Authorized by: Carmin Muskrat, MD   Consent:    Consent obtained:  Verbal and written   Consent given by:  Patient   Risks discussed:  Dysrhythmia, inadequate sedation, nausea and vomiting   Alternatives discussed:  Anxiolysis Universal protocol:    Immediately prior to procedure, a time out was called: yes     Patient identity confirmed:  Verbally with patient Indications:    Procedure performed:  Cardioversion   Procedure necessitating sedation performed by:  Physician performing sedation Pre-sedation assessment:    Time since last food or drink:  4   ASA classification: class 1 - normal, healthy patient     Mouth opening:  3 or more finger widths   Mallampati score:  I - soft palate, uvula, fauces, pillars visible   Pre-sedation assessments completed and reviewed: airway patency, cardiovascular function and hydration status     Pre-sedation assessment completed:  08/30/2022 10:00 PM Immediate pre-procedure details:    Reassessment: Patient reassessed immediately prior to procedure     Reviewed: vital signs and relevant labs/tests     Verified: bag valve mask available, emergency equipment available, intubation equipment available, IV patency confirmed, oxygen available and suction available   Procedure details (see MAR for exact dosages):    Preoxygenation:  Nasal cannula   Sedation:  Propofol   Intended level of sedation: deep   Intra-procedure monitoring:  Blood pressure monitoring, cardiac monitor, continuous pulse oximetry, continuous capnometry, frequent LOC assessments and frequent vital sign checks   Intra-procedure events: none     Total Provider sedation time (minutes):  20 Post-procedure details:     Post-sedation assessment completed:  08/30/2022 11:06 PM   Attendance: Constant attendance by certified staff until patient recovered     Recovery: Patient returned to pre-procedure baseline     Post-sedation assessments completed and reviewed: airway patency, cardiovascular function, mental status and nausea/vomiting     Patient is stable for discharge or admission: yes     Procedure completion:  Tolerated with difficulty     Medications Ordered in ED Medications  sodium chloride 0.9 % bolus 1,000 mL (0 mLs Intravenous Stopped 08/30/22 2245)    And  0.9 %  sodium chloride infusion ( Intravenous New Bag/Given 08/30/22 2158)  adenosine (ADENOCARD) 6 MG/2ML injection (  Not Given 08/30/22 2245)  adenosine (ADENOCARD) 6 MG/2ML injection (  Not Given 08/30/22 2245)  adenosine (ADENOCARD) 6 MG/2ML injection (  Not Given 08/30/22 2245)  LORazepam (ATIVAN)  injection 0.5 mg (0.5 mg Intravenous Given 08/30/22 2156)  propofol (DIPRIVAN) 10 mg/mL bolus/IV push 60 mg (60 mg Intravenous Given 08/30/22 2229)    ED Course/ Medical Decision Making/ A&P                           Medical Decision Making Patient with history of A-fib on Eliquis presents with several hours of discomfort, palpitations.  No relief with Cardizem at home, concern for electrolyte abnormalities versus paroxysmal A-fib.  Less evidence for ACS.  Labs sent monitor started. On monitor the patient had variable heart rate, 110 130 A-fib RVR abnormal pulse ox 99% room air normal  Amount and/or Complexity of Data Reviewed Independent Historian: spouse External Data Reviewed: notes.    Details: A-fib with multiple prior evaluations Labs:  Decision-making details documented in ED Course. ECG/medicine tests:  Decision-making details documented in ED Course.  Risk Prescription drug management.   After initial labs were unremarkable patient provided consent for cardioversion with conscious sedation.  See details in procedure tab 11:27  PM Patient awake, alert, remains in sinus rhythm.  This adult male with history of paroxysmal A-fib presents uncomfortable anxious, with palpitations, mild dyspnea.  He is awake, alert, has no evidence for other acute phenomena, had successful cardioversion, was monitored for time, returned to baseline, will follow-up with A-fib clinic.  Given his recurrent episodes, he was started on oral rate control regimen.  CRITICAL CARE Performed by: Carmin Muskrat Total critical care time: 35 minutes Critical care time was exclusive of separately billable procedures and treating other patients. Critical care was necessary to treat or prevent imminent or life-threatening deterioration. Critical care was time spent personally by me on the following activities: development of treatment plan with patient and/or surrogate as well as nursing, discussions with consultants, evaluation of patient's response to treatment, examination of patient, obtaining history from patient or surrogate, ordering and performing treatments and interventions, ordering and review of laboratory studies, ordering and review of radiographic studies, pulse oximetry and re-evaluation of patient's condition.  Final Clinical Impression(s) / ED Diagnoses Final diagnoses:  Atrial fibrillation with RVR [facility])    Rx / DC Orders ED Discharge Orders          Ordered    Amb referral to AFIB Clinic        08/30/22 2146    diltiazem (CARDIZEM CD) 120 MG 24 hr capsule  Daily        08/30/22 2326              Carmin Muskrat, MD 08/30/22 2327

## 2022-09-27 ENCOUNTER — Telehealth: Payer: Self-pay | Admitting: *Deleted

## 2022-09-27 ENCOUNTER — Encounter (HOSPITAL_COMMUNITY): Payer: Self-pay | Admitting: *Deleted

## 2022-09-27 NOTE — Telephone Encounter (Signed)
Patient states: - He was a patient of Dr. Tamela Oddi when she was at [facility]  - He received a letter informing him of her new location   Patient is requesting to become a patient of hers again. Is this okay?

## 2022-10-12 DIAGNOSIS — E669 Obesity, unspecified: Secondary | ICD-10-CM | POA: Diagnosis not present

## 2022-10-12 DIAGNOSIS — I480 Paroxysmal atrial fibrillation: Secondary | ICD-10-CM | POA: Diagnosis not present

## 2022-10-12 DIAGNOSIS — F4010 Social phobia, unspecified: Secondary | ICD-10-CM | POA: Diagnosis not present

## 2022-10-12 DIAGNOSIS — F339 Major depressive disorder, recurrent, unspecified: Secondary | ICD-10-CM | POA: Diagnosis not present

## 2023-03-21 ENCOUNTER — Emergency Department (HOSPITAL_COMMUNITY)
Admission: EM | Admit: 2023-03-21 | Discharge: 2023-03-21 | Discharge status: Against Medical Advice | Payer: BC Managed Care – PPO | Attending: Emergency Medicine | Admitting: Emergency Medicine

## 2023-03-21 ENCOUNTER — Encounter (HOSPITAL_COMMUNITY): Payer: Self-pay

## 2023-03-21 ENCOUNTER — Other Ambulatory Visit: Payer: Self-pay

## 2023-03-21 DIAGNOSIS — R109 Unspecified abdominal pain: Secondary | ICD-10-CM | POA: Insufficient documentation

## 2023-03-21 DIAGNOSIS — Z5321 Procedure and treatment not carried out due to patient leaving prior to being seen by health care provider: Secondary | ICD-10-CM | POA: Insufficient documentation

## 2023-03-21 LAB — CBC WITH DIFFERENTIAL/PLATELET
Abs Immature Granulocytes: 0.01 10*3/uL (ref 0.00–0.07)
Basophils Absolute: 0.1 10*3/uL (ref 0.0–0.1)
Basophils Relative: 2 %
Eosinophils Absolute: 0.2 10*3/uL (ref 0.0–0.5)
Eosinophils Relative: 3 %
HCT: 43.8 % (ref 39.0–52.0)
Hemoglobin: 15.3 g/dL (ref 13.0–17.0)
Immature Granulocytes: 0 %
Lymphocytes Relative: 32 %
Lymphs Abs: 2.1 10*3/uL (ref 0.7–4.0)
MCH: 32.6 pg (ref 26.0–34.0)
MCHC: 34.9 g/dL (ref 30.0–36.0)
MCV: 93.4 fL (ref 80.0–100.0)
Monocytes Absolute: 0.5 10*3/uL (ref 0.1–1.0)
Monocytes Relative: 8 %
Neutro Abs: 3.5 10*3/uL (ref 1.7–7.7)
Neutrophils Relative %: 55 %
Platelets: 216 10*3/uL (ref 150–400)
RBC: 4.69 MIL/uL (ref 4.22–5.81)
RDW: 11.9 % (ref 11.5–15.5)
WBC: 6.4 10*3/uL (ref 4.0–10.5)
nRBC: 0 % (ref 0.0–0.2)

## 2023-03-21 LAB — URINALYSIS, ROUTINE W REFLEX MICROSCOPIC
Bilirubin Urine: NEGATIVE
Glucose, UA: NEGATIVE mg/dL
Hgb urine dipstick: NEGATIVE
Ketones, ur: NEGATIVE mg/dL
Leukocytes,Ua: NEGATIVE
Nitrite: NEGATIVE
Protein, ur: NEGATIVE mg/dL
Specific Gravity, Urine: 1.021 (ref 1.005–1.030)
pH: 5 (ref 5.0–8.0)

## 2023-03-21 LAB — COMPREHENSIVE METABOLIC PANEL
ALT: 23 U/L (ref 0–44)
AST: 21 U/L (ref 15–41)
Albumin: 4.6 g/dL (ref 3.5–5.0)
Alkaline Phosphatase: 66 U/L (ref 38–126)
Anion gap: 8 (ref 5–15)
BUN: 17 mg/dL (ref 8–23)
CO2: 25 mmol/L (ref 22–32)
Calcium: 9 mg/dL (ref 8.9–10.3)
Chloride: 102 mmol/L (ref 98–111)
Creatinine, Ser: 0.98 mg/dL (ref 0.61–1.24)
GFR, Estimated: >60 mL/min (ref >60)
Glucose, Bld: 94 mg/dL (ref 70–99)
Potassium: 3.6 mmol/L (ref 3.5–5.1)
Sodium: 135 mmol/L (ref 135–145)
Total Bilirubin: 0.8 mg/dL (ref 0.3–1.2)
Total Protein: 7.7 g/dL (ref 6.5–8.1)

## 2023-03-21 NOTE — ED Triage Notes (Signed)
Right flank pain x 1 week.   History of many kidney stones. Says he can usually pass 5mm stones at home but this one feels like it may be obstructing.

## 2023-03-21 NOTE — ED Notes (Signed)
After labs obtained. Pt notified staff that his symptoms have improved and wants to go home. Encouraged to return as needed.

## 2023-04-12 DIAGNOSIS — I480 Paroxysmal atrial fibrillation: Secondary | ICD-10-CM | POA: Diagnosis not present

## 2023-04-12 DIAGNOSIS — L819 Disorder of pigmentation, unspecified: Secondary | ICD-10-CM | POA: Diagnosis not present

## 2023-04-12 DIAGNOSIS — F339 Major depressive disorder, recurrent, unspecified: Secondary | ICD-10-CM | POA: Diagnosis not present

## 2023-04-12 DIAGNOSIS — Z9889 Other specified postprocedural states: Secondary | ICD-10-CM | POA: Diagnosis not present

## 2023-05-28 ENCOUNTER — Encounter (HOSPITAL_COMMUNITY): Payer: Self-pay

## 2023-05-28 ENCOUNTER — Emergency Department (HOSPITAL_COMMUNITY): Payer: BC Managed Care – PPO

## 2023-05-28 ENCOUNTER — Emergency Department (HOSPITAL_COMMUNITY)
Admission: EM | Admit: 2023-05-28 | Discharge: 2023-05-28 | Disposition: A | Discharge status: Home / Self Care | Payer: BC Managed Care – PPO | Attending: Student | Admitting: Student

## 2023-05-28 ENCOUNTER — Other Ambulatory Visit: Payer: Self-pay

## 2023-05-28 DIAGNOSIS — R140 Abdominal distension (gaseous): Secondary | ICD-10-CM | POA: Diagnosis not present

## 2023-05-28 DIAGNOSIS — R109 Unspecified abdominal pain: Secondary | ICD-10-CM | POA: Diagnosis not present

## 2023-05-28 DIAGNOSIS — Z7901 Long term (current) use of anticoagulants: Secondary | ICD-10-CM | POA: Insufficient documentation

## 2023-05-28 DIAGNOSIS — N201 Calculus of ureter: Secondary | ICD-10-CM | POA: Insufficient documentation

## 2023-05-28 DIAGNOSIS — N400 Enlarged prostate without lower urinary tract symptoms: Secondary | ICD-10-CM | POA: Diagnosis not present

## 2023-05-28 DIAGNOSIS — N202 Calculus of kidney with calculus of ureter: Secondary | ICD-10-CM | POA: Diagnosis not present

## 2023-05-28 LAB — CBC
HCT: 43.6 % (ref 39.0–52.0)
Hemoglobin: 14.8 g/dL (ref 13.0–17.0)
MCH: 33 pg (ref 26.0–34.0)
MCHC: 33.9 g/dL (ref 30.0–36.0)
MCV: 97.3 fL (ref 80.0–100.0)
Platelets: 187 10*3/uL (ref 150–400)
RBC: 4.48 MIL/uL (ref 4.22–5.81)
RDW: 12 % (ref 11.5–15.5)
WBC: 8.6 10*3/uL (ref 4.0–10.5)
nRBC: 0 % (ref 0.0–0.2)

## 2023-05-28 LAB — BASIC METABOLIC PANEL
Anion gap: 6 (ref 5–15)
BUN: 17 mg/dL (ref 8–23)
CO2: 26 mmol/L (ref 22–32)
Calcium: 8.7 mg/dL — ABNORMAL LOW (ref 8.9–10.3)
Chloride: 105 mmol/L (ref 98–111)
Creatinine, Ser: 0.97 mg/dL (ref 0.61–1.24)
GFR, Estimated: >60 mL/min (ref >60)
Glucose, Bld: 112 mg/dL — ABNORMAL HIGH (ref 70–99)
Potassium: 4.1 mmol/L (ref 3.5–5.1)
Sodium: 137 mmol/L (ref 135–145)

## 2023-05-28 LAB — URINALYSIS, ROUTINE W REFLEX MICROSCOPIC
Bacteria, UA: NONE SEEN
Bilirubin Urine: NEGATIVE
Glucose, UA: NEGATIVE mg/dL
Ketones, ur: NEGATIVE mg/dL
Leukocytes,Ua: NEGATIVE
Nitrite: NEGATIVE
Protein, ur: NEGATIVE mg/dL
RBC / HPF: >50 RBC/hpf (ref 0–5)
Specific Gravity, Urine: 1.017 (ref 1.005–1.030)
pH: 5 (ref 5.0–8.0)

## 2023-05-28 MED ORDER — TAMSULOSIN HCL 0.4 MG PO CAPS
0.4000 mg | ORAL_CAPSULE | Freq: Once | ORAL | Status: AC
  Administered 2023-05-28: 0.4 mg via ORAL
  Filled 2023-05-28: qty 1

## 2023-05-28 MED ORDER — KETOROLAC TROMETHAMINE 30 MG/ML IJ SOLN
30.0000 mg | Freq: Once | INTRAMUSCULAR | Status: AC
  Administered 2023-05-28: 30 mg via INTRAVENOUS
  Filled 2023-05-28: qty 1

## 2023-05-28 MED ORDER — LORAZEPAM 2 MG/ML IJ SOLN
1.0000 mg | Freq: Once | INTRAMUSCULAR | Status: AC
  Administered 2023-05-28: 1 mg via INTRAVENOUS
  Filled 2023-05-28: qty 1

## 2023-05-28 MED ORDER — MORPHINE SULFATE (PF) 4 MG/ML IV SOLN
4.0000 mg | Freq: Once | INTRAVENOUS | Status: AC
  Administered 2023-05-28: 4 mg via INTRAVENOUS
  Filled 2023-05-28: qty 1

## 2023-05-28 MED ORDER — HYDROMORPHONE HCL 1 MG/ML IJ SOLN
1.0000 mg | Freq: Once | INTRAMUSCULAR | Status: AC
  Administered 2023-05-28: 1 mg via INTRAVENOUS
  Filled 2023-05-28: qty 1

## 2023-05-28 MED ORDER — TAMSULOSIN HCL 0.4 MG PO CAPS: 0.4000 mg | ORAL_CAPSULE | Freq: Every day | ORAL | 0 refills | Status: DC

## 2023-05-28 MED ORDER — ONDANSETRON 4 MG PO TBDP: 4.0000 mg | ORAL_TABLET | Freq: Three times a day (TID) | ORAL | 0 refills | Status: DC | PRN

## 2023-05-28 MED ORDER — SODIUM CHLORIDE 0.9 % IV BOLUS
1000.0000 mL | Freq: Once | INTRAVENOUS | Status: AC
  Administered 2023-05-28: 1000 mL via INTRAVENOUS

## 2023-05-28 MED ORDER — OXYCODONE-ACETAMINOPHEN 5-325 MG PO TABS
1.0000 | ORAL_TABLET | Freq: Four times a day (QID) | ORAL | 0 refills | Status: DC | PRN
Start: 2023-05-28 — End: 2023-11-21

## 2023-05-28 MED ORDER — ONDANSETRON HCL 4 MG/2ML IJ SOLN
4.0000 mg | Freq: Once | INTRAMUSCULAR | Status: AC
  Administered 2023-05-28: 4 mg via INTRAVENOUS
  Filled 2023-05-28: qty 2

## 2023-05-28 NOTE — ED Provider Notes (Signed)
[place] EMERGENCY DEPARTMENT AT [facility] Provider Note   CSN: [id number] Arrival date & time: 05/28/23  4098     History  Chief Complaint  Patient presents with   Flank Pain    Anthony Roach is a 62 y.o. male with past medical history significant for anxiety, atrial fibrillation, kidney stones presents to the ED complaining of severe right-sided flank pain since Saturday.  Patient believes he has another obstructing kidney stone.  He states that he had one a couple weeks ago that he was able to pass.  Patient has associated nausea with the pain, but no vomiting.  Denies urinary symptoms.         Home Medications Prior to Admission medications   Medication Sig Start Date End Date Taking? Authorizing Provider  ondansetron (ZOFRAN-ODT) 4 MG disintegrating tablet Take 1 tablet (4 mg total) by mouth every 8 (eight) hours as needed for nausea or vomiting. 05/28/23  Yes Jocelyne Reinertsen R, PA-C  oxyCODONE-acetaminophen (PERCOCET/ROXICET) 5-325 MG tablet Take 1-2 tablets by mouth every 6 (six) hours as needed for severe pain. 05/28/23  Yes Romir Klimowicz R, PA-C  tamsulosin (FLOMAX) 0.4 MG CAPS capsule Take 1 capsule (0.4 mg total) by mouth daily. 05/28/23  Yes Eve Rey R, PA-C  apixaban (ELIQUIS) 5 MG TABS tablet Take 1 tablet (5 mg total) by mouth 2 (two) times daily. 04/23/22 05/23/22  Rolan Bucco, MD  diltiazem (CARDIZEM CD) 120 MG 24 hr capsule Take 1 capsule (120 mg total) by mouth daily. 08/30/22 09/29/22  Gerhard Munch, MD  diltiazem (CARDIZEM) 30 MG tablet Take 1 tablet (30 mg total) by mouth daily as needed (for elevated heart rate >130). 04/26/22 05/26/22  Noralee Stain, DO  escitalopram (LEXAPRO) 20 MG tablet Take 20 mg by mouth daily.    [provider]      Allergies    Bee venom    Review of Systems   Review of Systems  Gastrointestinal:  Positive for nausea. Negative for vomiting.  Genitourinary:  Positive for flank pain. Negative for  decreased urine volume, difficulty urinating, dysuria, hematuria and urgency.    Physical Exam Updated Vital Signs BP 105/77   Pulse (!) 56   Temp 97.9 F (36.6 C) (Oral)   Resp 18   Ht 5\' 10"  (1.778 m)   Wt 93 kg   SpO2 100%   BMI 29.41 kg/m  Physical Exam Vitals and nursing note reviewed.  Constitutional:      General: He is not in acute distress.    Appearance: Normal appearance. He is not ill-appearing or diaphoretic.     Comments: Patient appears very uncomfortable.  Cardiovascular:     Rate and Rhythm: Normal rate and regular rhythm.  Pulmonary:     Effort: Pulmonary effort is normal.  Abdominal:     General: Abdomen is flat.     Palpations: Abdomen is soft.     Tenderness: There is no abdominal tenderness. There is right CVA tenderness.  Skin:    General: Skin is warm and dry.     Capillary Refill: Capillary refill takes less than 2 seconds.  Neurological:     Mental Status: He is alert. Mental status is at baseline.  Psychiatric:        Mood and Affect: Mood normal.        Behavior: Behavior normal.     ED Results / Procedures / Treatments   Labs (all labs ordered are listed, but only abnormal results are  displayed) Labs Reviewed  URINALYSIS, ROUTINE W REFLEX MICROSCOPIC - Abnormal; Notable for the following components:      Result Value   Hgb urine dipstick LARGE (*)    All other components within normal limits  BASIC METABOLIC PANEL - Abnormal; Notable for the following components:   Glucose, Bld 112 (*)    Calcium 8.7 (*)    All other components within normal limits  CBC    EKG None  Radiology CT RENAL STONE STUDY  Result Date: 05/28/2023 CLINICAL DATA:  Right flank pain.  Stone EXAM: CT ABDOMEN AND PELVIS WITHOUT CONTRAST TECHNIQUE: Multidetector CT imaging of the abdomen and pelvis was performed following the standard protocol without IV contrast. RADIATION DOSE REDUCTION: This exam was performed according to the departmental dose-optimization  program which includes automated exposure control, adjustment of the mA and/or kV according to patient size and/or use of iterative reconstruction technique. COMPARISON:  CT 11/13/2017 FINDINGS: Lower chest: There is some linear opacity at the bases likely scar or atelectasis. No pleural effusion. Evaluation significantly limited by motion. Hepatobiliary: Evaluation limited by motion. Grossly preserved hepatic parenchyma. Gallbladder is nondilated. Pancreas: Obscured by motion. Grossly preserved pancreatic parenchyma. Spleen: Nonenlarged.  Limited by motion. Adrenals/Urinary Tract: The adrenal glands are grossly preserved. There are numerous bilateral nonobstructing renal stones. At least 8 left-sided stones are seen measuring up to 10 mm. Similar number on the right with the largest towards the lower pole measuring 12 mm. There is some perinephric stranding, right-greater-than-left. Right kidney is enlarged compared to left as well. Mild right-sided renal collecting system dilatation with a stone in the mid right ureter measuring up to 6 mm in cephalocaudal length. No additional ureteral stones. Preserved contours of the urinary bladder. Stomach/Bowel: On this non oral contrast exam, large bowel has a normal course and caliber with scattered stool. Stomach is mildly distended with fluid. Small bowel is nondilated. Poor visualization of the appendix. No pericecal stranding or fluid. Study limited by motion. Vascular/Lymphatic: Aortic atherosclerosis. No enlarged abdominal or pelvic lymph nodes. Reproductive: Lobular prostate. Please correlate with patient's PSA. 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 Please correlate with history. Other: Evaluation limited by motion. No obvious free air or free fluid. Musculoskeletal: Degenerative changes of the spine and pelvis. IMPRESSION: Study significantly by motion. Numerous bilateral renal stones identified. There is a proximally 6 mm stone  in the mid right ureter with mild proximal collecting system dilatation. No bowel obstruction. Enlarged prostate with lobular margins. Please correlate with patient's PSA Electronically Signed   By: Karen Kays M.D.   On: 05/28/2023 10:53    Procedures Procedures    Medications Ordered in ED Medications  ketorolac (TORADOL) 30 MG/ML injection 30 mg (30 mg Intravenous Given 05/28/23 0905)  sodium chloride 0.9 % bolus 1,000 mL (0 mLs Intravenous Stopped 05/28/23 1015)  tamsulosin (FLOMAX) capsule 0.4 mg (0.4 mg Oral Given 05/28/23 0909)  ondansetron (ZOFRAN) injection 4 mg (4 mg Intravenous Given 05/28/23 0917)  morphine (PF) 4 MG/ML injection 4 mg (4 mg Intravenous Given 05/28/23 0935)  HYDROmorphone (DILAUDID) injection 1 mg (1 mg Intravenous Given 05/28/23 1014)  LORazepam (ATIVAN) injection 1 mg (1 mg Intravenous Given 05/28/23 1035)    ED Course/ Medical Decision Making/ A&P                                 Medical Decision Making Amount and/or Complexity of  Data Reviewed Labs: ordered. Radiology: ordered.  Risk Prescription drug management.   This patient presents to the ED with chief complaint(s) of right flank pain with pertinent past medical history of kidney stones.  The complaint involves an extensive differential diagnosis and also carries with it a high risk of complications and morbidity.    The differential diagnosis includes obstructing stone, pyelonephritis, UTI   The initial plan is to obtain labs, UA, CT imaging  Additional history obtained: Records reviewed  - unable to find record of recent imaging or records where patient was seen for obstructing stone  Initial Assessment:   On exam, patient is lying in bed and appears very uncomfortable.  Patient is unable to lie still.  Right CVA tenderness on exam.  Abdomen is soft and nondistended.  No overlying skin changes.    Independent ECG/labs interpretation:  The following labs were independently interpreted:  UA  without infection.  Large microscopic Hgb.  Metabolic panel with normal renal function.  No electrolyte derangement.  CBC unremarkable.   Independent visualization and interpretation of imaging: I independently visualized the following imaging with scope of interpretation limited to determining acute life threatening conditions related to emergency care: CT renal stone study, which revealed proximal 6 mm stone in mid right ureter with mild proximal collecting system dilatation.    Treatment and Reassessment: Patient given IV fluids, Toradol, Flomax, and Zofran without improvement in pain symptoms.  Patient continued to have pain after morphine and dilaudid.  IV Ativan given.  Patient will be reassessed.    Patient appears much more comfortable.  After shared medical decision making conversation, patient states he is willing to attempt PO medication management at home and understands strict return precautions.    Disposition: Oxycodone sent to pharmacy for severe and breakthrough pain.  Flomax and Zofran also sent in.  Patient to follow up with [facility].   The patient has been appropriately medically screened and/or stabilized in the ED. I have low suspicion for any other emergent medical condition which would require further screening, evaluation or treatment in the ED or require inpatient management. At time of discharge the patient is hemodynamically stable and in no acute distress. I have discussed work-up results and diagnosis with patient and answered all questions. Patient is agreeable with discharge plan. We discussed strict return precautions for returning to the emergency department and they verbalized understanding.           Final Clinical Impression(s) / ED Diagnoses Final diagnoses:  Ureterolithiasis    Rx / DC Orders ED Discharge Orders          Ordered    ondansetron (ZOFRAN-ODT) 4 MG disintegrating tablet  Every 8 hours PRN        05/28/23 1116     oxyCODONE-acetaminophen (PERCOCET/ROXICET) 5-325 MG tablet  Every 6 hours PRN        05/28/23 1116    tamsulosin (FLOMAX) 0.4 MG CAPS capsule  Daily        05/28/23 [id number], PA-C 05/28/23 1122    Glendora Score, MD 05/28/23 2127

## 2023-05-28 NOTE — Discharge Instructions (Addendum)
Thank you for allowing [place] to be a part of your care today.  Your imaging revealed a 6 mm stone on the right side.  Schedule a follow up appointment as soon as possible with [facility].   A prescription pain medicine has been sent to the pharmacy for you to take for pain as needed.  Begin taking Flomax daily starting tomorrow.  Take the nausea medication as needed.    Increase your fluid intake.   Return to the ED if you have severe pain, nausea, and/or vomiting that is not controlled with medication, develop fever, or if you have new concerns.

## 2023-05-28 NOTE — ED Triage Notes (Signed)
Patient stated he has had right sided flank pain since Saturday. Stated he thinks it is a 7mm kidney stone. Unable to sit still in triage. No burning urination.

## 2023-05-30 ENCOUNTER — Emergency Department (HOSPITAL_COMMUNITY): Payer: BC Managed Care – PPO

## 2023-05-30 ENCOUNTER — Encounter (HOSPITAL_COMMUNITY): Payer: Self-pay | Admitting: Emergency Medicine

## 2023-05-30 ENCOUNTER — Other Ambulatory Visit: Payer: Self-pay

## 2023-05-30 ENCOUNTER — Emergency Department (HOSPITAL_COMMUNITY): Payer: BC Managed Care – PPO | Admitting: Anesthesiology

## 2023-05-30 ENCOUNTER — Ambulatory Visit (HOSPITAL_COMMUNITY)
Admission: EM | Admit: 2023-05-30 | Discharge: 2023-05-30 | Disposition: A | Discharge status: Home / Self Care | Payer: BC Managed Care – PPO | Attending: Emergency Medicine | Admitting: Emergency Medicine

## 2023-05-30 ENCOUNTER — Encounter (HOSPITAL_COMMUNITY)
Admission: EM | Disposition: A | Discharge status: Home / Self Care | Payer: Self-pay | Source: Home / Self Care | Attending: Emergency Medicine

## 2023-05-30 DIAGNOSIS — N201 Calculus of ureter: Secondary | ICD-10-CM | POA: Insufficient documentation

## 2023-05-30 DIAGNOSIS — Z7901 Long term (current) use of anticoagulants: Secondary | ICD-10-CM | POA: Insufficient documentation

## 2023-05-30 DIAGNOSIS — R9431 Abnormal electrocardiogram [ECG] [EKG]: Secondary | ICD-10-CM | POA: Diagnosis not present

## 2023-05-30 DIAGNOSIS — I480 Paroxysmal atrial fibrillation: Secondary | ICD-10-CM | POA: Diagnosis not present

## 2023-05-30 DIAGNOSIS — I4891 Unspecified atrial fibrillation: Secondary | ICD-10-CM | POA: Diagnosis not present

## 2023-05-30 DIAGNOSIS — Z87442 Personal history of urinary calculi: Secondary | ICD-10-CM | POA: Diagnosis not present

## 2023-05-30 DIAGNOSIS — I700 Atherosclerosis of aorta: Secondary | ICD-10-CM | POA: Diagnosis not present

## 2023-05-30 DIAGNOSIS — Z87891 Personal history of nicotine dependence: Secondary | ICD-10-CM | POA: Diagnosis not present

## 2023-05-30 DIAGNOSIS — N400 Enlarged prostate without lower urinary tract symptoms: Secondary | ICD-10-CM | POA: Diagnosis not present

## 2023-05-30 DIAGNOSIS — I878 Other specified disorders of veins: Secondary | ICD-10-CM | POA: Diagnosis not present

## 2023-05-30 DIAGNOSIS — N200 Calculus of kidney: Secondary | ICD-10-CM | POA: Diagnosis not present

## 2023-05-30 DIAGNOSIS — R109 Unspecified abdominal pain: Secondary | ICD-10-CM | POA: Diagnosis not present

## 2023-05-30 HISTORY — PX: CYSTOSCOPY WITH RETROGRADE PYELOGRAM, URETEROSCOPY AND STENT PLACEMENT: SHX5789

## 2023-05-30 LAB — CBC WITH DIFFERENTIAL/PLATELET
Abs Immature Granulocytes: 0.01 10*3/uL (ref 0.00–0.07)
Basophils Absolute: 0.1 10*3/uL (ref 0.0–0.1)
Basophils Relative: 1 %
Eosinophils Absolute: 0.2 10*3/uL (ref 0.0–0.5)
Eosinophils Relative: 3 %
HCT: 41.6 % (ref 39.0–52.0)
Hemoglobin: 14.4 g/dL (ref 13.0–17.0)
Immature Granulocytes: 0 %
Lymphocytes Relative: 28 %
Lymphs Abs: 1.5 10*3/uL (ref 0.7–4.0)
MCH: 32.5 pg (ref 26.0–34.0)
MCHC: 34.6 g/dL (ref 30.0–36.0)
MCV: 93.9 fL (ref 80.0–100.0)
Monocytes Absolute: 0.4 10*3/uL (ref 0.1–1.0)
Monocytes Relative: 8 %
Neutro Abs: 3.3 10*3/uL (ref 1.7–7.7)
Neutrophils Relative %: 60 %
Platelets: 176 10*3/uL (ref 150–400)
RBC: 4.43 MIL/uL (ref 4.22–5.81)
RDW: 11.9 % (ref 11.5–15.5)
WBC: 5.5 10*3/uL (ref 4.0–10.5)
nRBC: 0 % (ref 0.0–0.2)

## 2023-05-30 LAB — BASIC METABOLIC PANEL
Anion gap: 9 (ref 5–15)
BUN: 18 mg/dL (ref 8–23)
CO2: 21 mmol/L — ABNORMAL LOW (ref 22–32)
Calcium: 9 mg/dL (ref 8.9–10.3)
Chloride: 106 mmol/L (ref 98–111)
Creatinine, Ser: 1.29 mg/dL — ABNORMAL HIGH (ref 0.61–1.24)
GFR, Estimated: >60 mL/min (ref >60)
Glucose, Bld: 107 mg/dL — ABNORMAL HIGH (ref 70–99)
Potassium: 3.9 mmol/L (ref 3.5–5.1)
Sodium: 136 mmol/L (ref 135–145)

## 2023-05-30 SURGERY — CYSTOURETEROSCOPY, WITH RETROGRADE PYELOGRAM AND STENT INSERTION
Anesthesia: General | Laterality: Right

## 2023-05-30 MED ORDER — ONDANSETRON HCL 4 MG/2ML IJ SOLN
INTRAMUSCULAR | Status: AC
  Filled 2023-05-30: qty 2

## 2023-05-30 MED ORDER — OXYCODONE HCL 5 MG PO TABS
ORAL_TABLET | ORAL | Status: AC
  Filled 2023-05-30: qty 1

## 2023-05-30 MED ORDER — HYDROMORPHONE HCL 1 MG/ML IJ SOLN
1.0000 mg | Freq: Once | INTRAMUSCULAR | Status: AC
  Administered 2023-05-30: 1 mg via INTRAVENOUS
  Filled 2023-05-30: qty 1

## 2023-05-30 MED ORDER — OXYCODONE HCL 5 MG PO TABS
5.0000 mg | ORAL_TABLET | Freq: Once | ORAL | Status: AC | PRN
  Administered 2023-05-30: 5 mg via ORAL

## 2023-05-30 MED ORDER — KETOROLAC TROMETHAMINE 30 MG/ML IJ SOLN
30.0000 mg | Freq: Once | INTRAMUSCULAR | Status: AC | PRN
  Administered 2023-05-30: 30 mg via INTRAVENOUS

## 2023-05-30 MED ORDER — LACTATED RINGERS IV SOLN
INTRAVENOUS | Status: DC | PRN
Start: 2023-05-30 — End: 2023-05-30

## 2023-05-30 MED ORDER — ACETAMINOPHEN 10 MG/ML IV SOLN: 1000.0000 mg | Freq: Once | INTRAVENOUS | Status: DC | PRN

## 2023-05-30 MED ORDER — SODIUM CHLORIDE 0.9 % IR SOLN
Status: DC | PRN
  Administered 2023-05-30: 2000 mL

## 2023-05-30 MED ORDER — OXYCODONE HCL 5 MG/5ML PO SOLN: 5.0000 mg | Freq: Once | ORAL | Status: AC | PRN

## 2023-05-30 MED ORDER — FENTANYL CITRATE (PF) 100 MCG/2ML IJ SOLN
INTRAMUSCULAR | Status: DC | PRN
  Administered 2023-05-30 (×2): 50 ug via INTRAVENOUS

## 2023-05-30 MED ORDER — SUCCINYLCHOLINE CHLORIDE 200 MG/10ML IV SOSY
PREFILLED_SYRINGE | INTRAVENOUS | Status: DC | PRN
  Administered 2023-05-30: 100 mg via INTRAVENOUS

## 2023-05-30 MED ORDER — DEXAMETHASONE SODIUM PHOSPHATE 10 MG/ML IJ SOLN
INTRAMUSCULAR | Status: DC | PRN
  Administered 2023-05-30: 10 mg via INTRAVENOUS

## 2023-05-30 MED ORDER — PROPOFOL 10 MG/ML IV BOLUS
INTRAVENOUS | Status: AC
  Filled 2023-05-30: qty 20

## 2023-05-30 MED ORDER — FENTANYL CITRATE (PF) 100 MCG/2ML IJ SOLN
INTRAMUSCULAR | Status: AC
  Filled 2023-05-30: qty 2

## 2023-05-30 MED ORDER — SUCCINYLCHOLINE CHLORIDE 200 MG/10ML IV SOSY
PREFILLED_SYRINGE | INTRAVENOUS | Status: AC
  Filled 2023-05-30: qty 10

## 2023-05-30 MED ORDER — PROPOFOL 10 MG/ML IV BOLUS
INTRAVENOUS | Status: DC | PRN
  Administered 2023-05-30: 150 mg via INTRAVENOUS

## 2023-05-30 MED ORDER — LIDOCAINE 2% (20 MG/ML) 5 ML SYRINGE
INTRAMUSCULAR | Status: DC | PRN
  Administered 2023-05-30: 100 mg via INTRAVENOUS

## 2023-05-30 MED ORDER — LIDOCAINE HCL (PF) 2 % IJ SOLN
INTRAMUSCULAR | Status: AC
  Filled 2023-05-30: qty 5

## 2023-05-30 MED ORDER — KETOROLAC TROMETHAMINE 15 MG/ML IJ SOLN
15.0000 mg | Freq: Once | INTRAMUSCULAR | Status: AC
  Administered 2023-05-30: 15 mg via INTRAVENOUS
  Filled 2023-05-30: qty 1

## 2023-05-30 MED ORDER — IOHEXOL 300 MG/ML  SOLN
INTRAMUSCULAR | Status: DC | PRN
  Administered 2023-05-30: 2 mL

## 2023-05-30 MED ORDER — ROCURONIUM BROMIDE 10 MG/ML (PF) SYRINGE
PREFILLED_SYRINGE | INTRAVENOUS | Status: AC
  Filled 2023-05-30: qty 10

## 2023-05-30 MED ORDER — FENTANYL CITRATE PF 50 MCG/ML IJ SOSY: 25.0000 ug | PREFILLED_SYRINGE | INTRAMUSCULAR | Status: DC | PRN

## 2023-05-30 MED ORDER — MIDAZOLAM HCL 5 MG/5ML IJ SOLN
INTRAMUSCULAR | Status: DC | PRN
  Administered 2023-05-30: 2 mg via INTRAVENOUS

## 2023-05-30 MED ORDER — CEFAZOLIN SODIUM-DEXTROSE 2-3 GM-%(50ML) IV SOLR
INTRAVENOUS | Status: DC | PRN
Start: 2023-05-30 — End: 2023-05-30
  Administered 2023-05-30: 2 g via INTRAVENOUS

## 2023-05-30 MED ORDER — KETOROLAC TROMETHAMINE 30 MG/ML IJ SOLN
INTRAMUSCULAR | Status: AC
  Filled 2023-05-30: qty 1

## 2023-05-30 MED ORDER — MIDAZOLAM HCL 2 MG/2ML IJ SOLN
INTRAMUSCULAR | Status: AC
  Filled 2023-05-30: qty 2

## 2023-05-30 MED ORDER — EPHEDRINE 5 MG/ML INJ
INTRAVENOUS | Status: AC
  Filled 2023-05-30: qty 5

## 2023-05-30 MED ORDER — DILTIAZEM HCL ER COATED BEADS 120 MG PO CP24
120.0000 mg | ORAL_CAPSULE | Freq: Every day | ORAL | Status: DC
  Administered 2023-05-30: 120 mg via ORAL
  Filled 2023-05-30: qty 1

## 2023-05-30 MED ORDER — ACETAMINOPHEN 500 MG PO TABS: 1000.0000 mg | ORAL_TABLET | Freq: Once | ORAL | Status: DC

## 2023-05-30 MED ORDER — AMISULPRIDE (ANTIEMETIC) 5 MG/2ML IV SOLN: 10.0000 mg | Freq: Once | INTRAVENOUS | Status: DC | PRN

## 2023-05-30 MED ORDER — ONDANSETRON HCL 4 MG/2ML IJ SOLN
INTRAMUSCULAR | Status: DC | PRN
  Administered 2023-05-30: 4 mg via INTRAVENOUS

## 2023-05-30 MED ORDER — CEFAZOLIN SODIUM-DEXTROSE 2-4 GM/100ML-% IV SOLN
INTRAVENOUS | Status: AC
  Filled 2023-05-30: qty 100

## 2023-05-30 MED ORDER — DEXAMETHASONE SODIUM PHOSPHATE 10 MG/ML IJ SOLN
INTRAMUSCULAR | Status: AC
  Filled 2023-05-30: qty 1

## 2023-05-30 MED ORDER — KETAMINE HCL 50 MG/5ML IJ SOSY
0.3000 mg/kg | PREFILLED_SYRINGE | Freq: Once | INTRAMUSCULAR | Status: AC
  Administered 2023-05-30: 28 mg via INTRAVENOUS
  Filled 2023-05-30: qty 5

## 2023-05-30 SURGICAL SUPPLY — 25 items
BAG COUNTER SPONGE SURGICOUNT (BAG) IMPLANT
BAG SPNG CNTER NS LX DISP (BAG)
BAG URO CATCHER STRL LF (MISCELLANEOUS) ×1 IMPLANT
BASKET ZERO TIP NITINOL 2.4FR (BASKET) IMPLANT
BSKT STON RTRVL ZERO TP 2.4FR (BASKET) ×1
CATH URETL OPEN END 6FR 70 (CATHETERS) IMPLANT
CLOTH BEACON ORANGE TIMEOUT ST (SAFETY) ×1 IMPLANT
GLOVE SURG LX STRL 7.5 STRW (GLOVE) ×1 IMPLANT
GOWN STRL REUS W/ TWL XL LVL3 (GOWN DISPOSABLE) ×1 IMPLANT
GOWN STRL REUS W/TWL XL LVL3 (GOWN DISPOSABLE) ×1
GUIDEWIRE STR DUAL SENSOR (WIRE) ×1 IMPLANT
GUIDEWIRE ZIPWRE .038 STRAIGHT (WIRE) IMPLANT
IV NS 1000ML (IV SOLUTION) ×1
IV NS 1000ML BAXH (IV SOLUTION) ×1 IMPLANT
KIT TURNOVER KIT A (KITS) IMPLANT
LASER FIB FLEXIVA PULSE ID 365 (Laser) IMPLANT
MANIFOLD NEPTUNE II (INSTRUMENTS) ×1 IMPLANT
PACK CYSTO (CUSTOM PROCEDURE TRAY) ×1 IMPLANT
PAD PREP 24X48 CUFFED NSTRL (MISCELLANEOUS) ×1 IMPLANT
SHEATH NAVIGATOR HD 12/14X36 (SHEATH) IMPLANT
STENT URET 6FRX26 CONTOUR (STENTS) IMPLANT
TRACTIP FLEXIVA PULS ID 200XHI (Laser) IMPLANT
TRACTIP FLEXIVA PULSE ID 200 (Laser)
TUBING CONNECTING 10 (TUBING) ×1 IMPLANT
TUBING UROLOGY SET (TUBING) ×1 IMPLANT

## 2023-05-30 NOTE — OR Nursing (Signed)
Stone taken by Dr. Borden 

## 2023-05-30 NOTE — ED Notes (Signed)
Dr.Borden at bedside  ?

## 2023-05-30 NOTE — Anesthesia Preprocedure Evaluation (Addendum)
Anesthesia Evaluation  Patient identified by MRN, date of birth, ID band Patient awake    Reviewed: Allergy & Precautions, NPO status , Patient's Chart, lab work & pertinent test results  Airway Mallampati: II  TM Distance: >3 FB Neck ROM: Full    Dental no notable dental hx.    Pulmonary former smoker   Pulmonary exam normal        Cardiovascular Normal cardiovascular exam+ dysrhythmias Atrial Fibrillation      Neuro/Psych  Headaches PSYCHIATRIC DISORDERS Anxiety Depression       GI/Hepatic negative GI ROS, Neg liver ROS,,,  Endo/Other  negative endocrine ROS    Renal/GU Renal disease     Musculoskeletal  (+) Arthritis ,    Abdominal   Peds  Hematology negative hematology ROS (+)   Anesthesia Other Findings Right Ureteral Stone  Reproductive/Obstetrics                             Anesthesia Physical Anesthesia Plan  ASA: 3  Anesthesia Plan: General   Post-op Pain Management:    Induction: Intravenous and Rapid sequence  PONV Risk Score and Plan: 2 and Ondansetron, Dexamethasone, Midazolam and Treatment may vary due to age or medical condition  Airway Management Planned: Oral ETT  Additional Equipment:   Intra-op Plan:   Post-operative Plan: Extubation in OR  Informed Consent: I have reviewed the patients History and Physical, chart, labs and discussed the procedure including the risks, benefits and alternatives for the proposed anesthesia with the patient or authorized representative who has indicated his/her understanding and acceptance.     Dental advisory given  Plan Discussed with: CRNA  Anesthesia Plan Comments:        Anesthesia Quick Evaluation

## 2023-05-30 NOTE — Discharge Instructions (Addendum)
You may see some blood in the urine and may have some burning with urination for 48-72 hours. You also may notice that you have to urinate more frequently or urgently after your procedure which is normal.  You should call should you develop an inability urinate, fever > 101, persistent nausea and vomiting that prevents you from eating or drinking to stay hydrated.  If you have a stent, you will likely urinate more frequently and urgently until the stent is removed and you may experience some discomfort/pain in the lower abdomen and flank especially when urinating. You may take pain medication prescribed to you if needed for pain. You may also intermittently have blood in the urine until the stent is removed. You may remove your stent on Monday morning.  Simply pull the string that is taped to your body and the stent will easily come out.  This may be best done in the shower as some urine may come out with the stent.  Usually you will feel relief once the stent is removed, but occasionally patients can develop pain due to residual swelling of the ureter that may temporarily obstruct the kidney.  This can be managed by taking pain medication and it will typically resolve with time.  Please do not hesitate to call if you have pain that is not controlled with your pain medication or does not improved within 24-48 hours.  

## 2023-05-30 NOTE — Transfer of Care (Signed)
Immediate Anesthesia Transfer of Care Note  Patient: Anthony Roach  Procedure(s) Performed: CYSTOSCOPY WITH RETROGRADE PYELOGRAM, URETEROSCOPY AND STENT PLACEMENT,HOLIUM LASER LITHOTRIPSY (Right)  Patient Location: PACU  Anesthesia Type:General  Level of Consciousness: sedated  Airway & Oxygen Therapy: Patient Spontanous Breathing and Patient connected to face mask oxygen  Post-op Assessment: Report given to RN and Post -op Vital signs reviewed and stable  Post vital signs: Reviewed and stable  Last Vitals:  Vitals Value Taken Time  BP 135/82 05/30/23 1105  Temp    Pulse 66 05/30/23 1106  Resp 11 05/30/23 1106  SpO2 93 % 05/30/23 1106  Vitals shown include unfiled device data.  Last Pain:  Vitals:   05/30/23 0952  TempSrc:   PainSc: 10-Worst pain ever         Complications: No notable events documented.

## 2023-05-30 NOTE — Anesthesia Postprocedure Evaluation (Signed)
Anesthesia Post Note  Patient: ARLEN DUPUIS  Procedure(s) Performed: CYSTOSCOPY WITH RETROGRADE PYELOGRAM, URETEROSCOPY AND STENT PLACEMENT,HOLIUM LASER LITHOTRIPSY (Right)     Patient location during evaluation: PACU Anesthesia Type: General Level of consciousness: awake Pain management: pain level controlled Vital Signs Assessment: post-procedure vital signs reviewed and stable Respiratory status: spontaneous breathing, nonlabored ventilation and respiratory function stable Cardiovascular status: blood pressure returned to baseline and stable Postop Assessment: no apparent nausea or vomiting Anesthetic complications: no   No notable events documented.  Last Vitals:  Vitals:   05/30/23 1130 05/30/23 1145  BP: (!) 147/88 (!) 141/88  Pulse: 77 69  Resp: 13 16  Temp:  36.6 C  SpO2: 96% 95%    Last Pain:  Vitals:   05/30/23 1145  TempSrc:   PainSc: 0-No pain                 Verginia Toohey P Jace Fermin

## 2023-05-30 NOTE — ED Notes (Signed)
Pt requesting additional pain medication. RN aware.

## 2023-05-30 NOTE — Op Note (Signed)
Preoperative diagnosis: Right ureteral calculus  Postoperative diagnosis: Right ureteral calculus  Procedure:  Cystoscopy Right ureteroscopy and stone removal Ureteroscopic laser lithotripsy Right ureteral stent placement (6 x 26 with string)  Right retrograde pyelography with interpretation  Surgeon: Moody Bruins. M.D.  Anesthesia: General  Complications: None  Intraoperative findings: Right retrograde pyelography demonstrated a filling defect within the distal/mid right ureter consistent with the patient's known calculus without other abnormalities.  EBL: Minimal  Specimens: Right ureteral calculus  Disposition of specimens: [facility] for stone analysis  Indication: Anthony Roach is a 62 y.o. year old patient with urolithiasis and symptomatic right ureteral calculus. After reviewing the management options for treatment, the patient elected to proceed with the above surgical procedure(s). We have discussed the potential benefits and risks of the procedure, side effects of the proposed treatment, the likelihood of the patient achieving the goals of the procedure, and any potential problems that might occur during the procedure or recuperation. Informed consent has been obtained.  Description of procedure:  The patient was taken to the operating room and general anesthesia was induced.  The patient was placed in the dorsal lithotomy position, prepped and draped in the usual sterile fashion, and preoperative antibiotics were administered. A preoperative time-out was performed.   Cystourethroscopy was performed.  The patient's urethra was examined and was normal. The bladder was then systematically examined in its entirety. There was no evidence for any bladder tumors, stones, or other mucosal pathology.    Attention then turned to the right ureteral orifice and a ureteral catheter was used to intubate the ureteral orifice.  Omnipaque contrast was  injected through the ureteral catheter and a retrograde pyelogram was performed with findings as dictated above.  A 0.38 sensor guidewire was then advanced up the right ureter into the renal pelvis under fluoroscopic guidance. The 6 Fr semirigid ureteroscope was then advanced into the ureter next to the guidewire and the calculus was identified.   The stone was then fragmented with the 365 micron holmium laser fiber on a setting of 0.6 J and frequency of 6 Hz.   All stones were then removed from the ureter with a zero tip nitinol basket.  Reinspection of the ureter revealed no remaining visible stones or fragments.   The wire was then backloaded through the cystoscope and a ureteral stent was advance over the wire using Seldinger technique.  The stent was positioned appropriately under fluoroscopic and cystoscopic guidance.  The wire was then removed with an adequate stent curl noted in the renal pelvis as well as in the bladder.  The bladder was then emptied and the procedure ended.  The patient appeared to tolerate the procedure well and without complications.  The patient was able to be awakened and transferred to the recovery unit in satisfactory condition.

## 2023-05-30 NOTE — ED Provider Notes (Signed)
[place] EMERGENCY DEPARTMENT AT [facility] Provider Note   CSN: [id number] Arrival date & time: 05/30/23  0055     History  Chief Complaint  Patient presents with   Flank Pain    Anthony Roach is a 62 y.o. male history of kidney stones, A-fib on Eliquis, Stevens-Johnson syndrome presents today for evaluation of flank pain.  Patient was seen on 05/28/2023, was found to have a 6 mm stone with mild proximal collecting system dilation.  Patient returns today due to continuing right-sided flank pain, nausea and vomiting.  Denies fever, chest pain, shortness of breath.  He has not followed-up with urology. Denies fevers.   Flank Pain    Past Medical History:  Diagnosis Date   A-fib [facility])    Anxiety    Dysrhythmia    A Fib/Paroxysmal Atrial Fibrilation   Renal disorder    kidney stones   Stevens-Johnson syndrome (HCC) 2005   Past Surgical History:  Procedure Laterality Date   CARDIOVERSION N/A 04/26/2022   Procedure: CARDIOVERSION;  Surgeon: Little Ishikawa, MD;  Location: [facility] ENDOSCOPY;  Service: Cardiovascular;  Laterality: N/A;   Cysto, stents     CYSTOSCOPY W/ URETERAL STENT PLACEMENT Bilateral 03/06/2016   Procedure: CYSTOSCOPY WITH BILATERAL RETROGRADE PYELOGRAM/BILATERAL URETEROSCOPY AND BILATERAL LASER APPLICATION, BILATERAL URETERAL STENT PLACEMENT;  Surgeon: Sebastian Ache, MD;  Location: [facility] ORS;  Service: Urology;  Laterality: Bilateral;   Rotator Cuff Repair Left Shoulder     TEE WITHOUT CARDIOVERSION N/A 04/26/2022   Procedure: TRANSESOPHAGEAL ECHOCARDIOGRAM (TEE);  Surgeon: Little Ishikawa, MD;  Location: [facility] ENDOSCOPY;  Service: Cardiovascular;  Laterality: N/A;     Home Medications Prior to Admission medications   Medication Sig Start Date End Date Taking? Authorizing Provider  apixaban (ELIQUIS) 5 MG TABS tablet Take 1 tablet (5 mg total) by mouth 2 (two) times daily. 04/23/22 05/23/22  Rolan Bucco, MD  diltiazem (CARDIZEM CD)  120 MG 24 hr capsule Take 1 capsule (120 mg total) by mouth daily. 08/30/22 09/29/22  Gerhard Munch, MD  diltiazem (CARDIZEM) 30 MG tablet Take 1 tablet (30 mg total) by mouth daily as needed (for elevated heart rate >130). 04/26/22 05/26/22  Noralee Stain, DO  escitalopram (LEXAPRO) 20 MG tablet Take 20 mg by mouth daily.    [provider]  metoprolol succinate (TOPROL-XL) 25 MG 24 hr tablet Take 25 mg by mouth daily.    [provider]  ondansetron (ZOFRAN-ODT) 4 MG disintegrating tablet Take 1 tablet (4 mg total) by mouth every 8 (eight) hours as needed for nausea or vomiting. 05/28/23   Clark, Meghan R, PA-C  oxyCODONE-acetaminophen (PERCOCET/ROXICET) 5-325 MG tablet Take 1-2 tablets by mouth every 6 (six) hours as needed for severe pain. 05/28/23   Clark, Meghan R, PA-C  tamsulosin (FLOMAX) 0.4 MG CAPS capsule Take 1 capsule (0.4 mg total) by mouth daily. 05/28/23   Melton Alar R, PA-C      Allergies    Bee venom    Review of Systems   Review of Systems  Genitourinary:  Positive for flank pain.    Physical Exam Updated Vital Signs BP (!) 127/59 (BP Location: Right Arm)   Pulse 77   Temp 98.4 F (36.9 C) (Oral)   Resp 20   SpO2 95%  Physical Exam Vitals and nursing note reviewed.  Constitutional:      Comments: Appears very uncomfortable due to pain.  HENT:     Head: Normocephalic and atraumatic.  Mouth/Throat:     Mouth: Mucous membranes are moist.  Eyes:     General: No scleral icterus. Cardiovascular:     Rate and Rhythm: Normal rate and regular rhythm.     Pulses: Normal pulses.     Heart sounds: Normal heart sounds.  Pulmonary:     Effort: Pulmonary effort is normal.     Breath sounds: Normal breath sounds.  Abdominal:     General: Abdomen is flat.     Palpations: Abdomen is soft.     Tenderness: There is no abdominal tenderness.  Musculoskeletal:        General: No deformity.     Comments: Tenderness to palpation to right-sided flank.   Skin:    General: Skin is warm.     Findings: No rash.  Neurological:     General: No focal deficit present.     Mental Status: He is alert.  Psychiatric:        Mood and Affect: Mood normal.     ED Results / Procedures / Treatments   Labs (all labs ordered are listed, but only abnormal results are displayed) Labs Reviewed  BASIC METABOLIC PANEL - Abnormal; Notable for the following components:      Result Value   CO2 21 (*)    Glucose, Bld 107 (*)    Creatinine, Ser 1.29 (*)    All other components within normal limits  CBC WITH DIFFERENTIAL/PLATELET  URINALYSIS, ROUTINE W REFLEX MICROSCOPIC    EKG EKG Interpretation Date/Time:  Thursday May 30 2023 04:22:09 EDT Ventricular Rate:  68 PR Interval:  176 QRS Duration:  90 QT Interval:  392 QTC Calculation: 416 R Axis:   72  Text Interpretation: Normal sinus rhythm Normal ECG Confirmed by Tilden Fossa [phone]) on 05/30/2023 4:25:30 AM  Radiology DG Abdomen 1 View  Result Date: 05/30/2023 CLINICAL DATA:  Flank pain. EXAM: ABDOMEN - 1 VIEW COMPARISON:  05/28/2023. FINDINGS: The bowel gas pattern is normal. Renal calculi are noted bilaterally. There is a 5 mm calcification overlying the sacrum on the right, possible ureteral calculus. Stable phleboliths are noted in the pelvis on the right. IMPRESSION: 1. Bilateral nephrolithiasis. 2. 5 mm calcification projecting over the sacrum on the right, possible ureteral calculus. Electronically Signed   By: Thornell Sartorius M.D.   On: 05/30/2023 03:28   CT RENAL STONE STUDY  Result Date: 05/28/2023 CLINICAL DATA:  Right flank pain.  Stone EXAM: CT ABDOMEN AND PELVIS WITHOUT CONTRAST TECHNIQUE: Multidetector CT imaging of the abdomen and pelvis was performed following the standard protocol without IV contrast. RADIATION DOSE REDUCTION: This exam was performed according to the departmental dose-optimization program which includes automated exposure control, adjustment of the mA  and/or kV according to patient size and/or use of iterative reconstruction technique. COMPARISON:  CT 11/13/2017 FINDINGS: Lower chest: There is some linear opacity at the bases likely scar or atelectasis. No pleural effusion. Evaluation significantly limited by motion. Hepatobiliary: Evaluation limited by motion. Grossly preserved hepatic parenchyma. Gallbladder is nondilated. Pancreas: Obscured by motion. Grossly preserved pancreatic parenchyma. Spleen: Nonenlarged.  Limited by motion. Adrenals/Urinary Tract: The adrenal glands are grossly preserved. There are numerous bilateral nonobstructing renal stones. At least 8 left-sided stones are seen measuring up to 10 mm. Similar number on the right with the largest towards the lower pole measuring 12 mm. There is some perinephric stranding, right-greater-than-left. Right kidney is enlarged compared to left as well. Mild right-sided renal collecting system dilatation with a stone in the mid  right ureter measuring up to 6 mm in cephalocaudal length. No additional ureteral stones. Preserved contours of the urinary bladder. Stomach/Bowel: On this non oral contrast exam, large bowel has a normal course and caliber with scattered stool. Stomach is mildly distended with fluid. Small bowel is nondilated. Poor visualization of the appendix. No pericecal stranding or fluid. Study limited by motion. Vascular/Lymphatic: Aortic atherosclerosis. No enlarged abdominal or pelvic lymph nodes. Reproductive: Lobular prostate. Please correlate with patient's PSA. 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 Please correlate with history. Other: Evaluation limited by motion. No obvious free air or free fluid. Musculoskeletal: Degenerative changes of the spine and pelvis. IMPRESSION: Study significantly by motion. Numerous bilateral renal stones identified. There is a proximally 6 mm stone in the mid right ureter with mild proximal collecting system dilatation.  No bowel obstruction. Enlarged prostate with lobular margins. Please correlate with patient's PSA Electronically Signed   By: Karen Kays M.D.   On: 05/28/2023 10:53    Procedures Procedures    Medications Ordered in ED Medications  ketorolac (TORADOL) 15 MG/ML injection 15 mg (15 mg Intravenous Given 05/30/23 0116)  HYDROmorphone (DILAUDID) injection 1 mg (1 mg Intravenous Given 05/30/23 0218)  HYDROmorphone (DILAUDID) injection 1 mg (1 mg Intravenous Given 05/30/23 0237)  ketamine 50 mg in normal saline 5 mL (10 mg/mL) syringe (28 mg Intravenous Given 05/30/23 0319)    ED Course/ Medical Decision Making/ A&P Clinical Course as of 05/30/23 0747  Thu May 30, 2023  0720 OR urology [JD]    Clinical Course User Index [JD] Laurence Spates, MD                                 Medical Decision Making Amount and/or Complexity of Data Reviewed Labs: ordered. Radiology: ordered.  Risk Prescription drug management. Decision regarding hospitalization.   This patient presents to the ED for flank pain, this involves an extensive number of treatment options, and is a complaint that carries with a high risk of complications and morbidity.  The differential diagnosis includes infected stone, obstructed stone, testicular torsion, appendicitis, UTI, pyelonephritis infectious etiology.  This is not an exhaustive list.  Lab tests: I ordered and personally interpreted labs.  The pertinent results include: WBC unremarkable. Hbg unremarkable. Platelets unremarkable. Electrolytes unremarkable. BUN, creatinine unremarkable.   Imaging studies: I ordered imaging studies, personally reviewed, interpreted imaging and agree with the radiologist's interpretations. The results include: 5 mm calcification projecting over the sacrum on th right side.   Problem list/ ED course/ Critical interventions/ Medical management: HPI: See above Vital signs within normal range and stable throughout  visit. Laboratory/imaging studies significant for: See above. On physical examination, patient is afebrile and appears in no acute distress. Patient presents with flank pain likely secondary to renal colic from kidney stones.  Creatinine today is elevated at 1.29.  Despite multiple rounds of pain medications, patient continues to have significant pain.  Will consult urology for admission. I have reviewed the patient home medicines and have made adjustments as needed.  Cardiac monitoring/EKG: The patient was maintained on a cardiac monitor.  I personally reviewed and interpreted the cardiac monitor which showed an underlying rhythm of: sinus rhythm.  Additional history obtained: External records from outside source obtained and reviewed including: Chart review including previous notes, labs, imaging.  Consultations obtained: My attending Dr. Madilyn Hook spoke to Dr. Laverle Patter who will see patient at  bedside.  Disposition Admit to urology. This chart was dictated using voice recognition software.  Despite best efforts to proofread,  errors can occur which can change the documentation meaning.          Final Clinical Impression(s) / ED Diagnoses Final diagnoses:  Kidney stone    Rx / DC Orders ED Discharge Orders     None         Jeanelle Malling, [place] 05/30/23 [id number]    Tilden Fossa, MD 05/31/23 0401

## 2023-05-30 NOTE — ED Notes (Signed)
Urologist at bedside.

## 2023-05-30 NOTE — Anesthesia Procedure Notes (Signed)
Procedure Name: Intubation Date/Time: 05/30/2023 10:31 AM  Performed by: Doran Clay, CRNAPre-anesthesia Checklist: Patient identified, Emergency Drugs available, Suction available, Patient being monitored and Timeout performed Patient Re-evaluated:Patient Re-evaluated prior to induction Oxygen Delivery Method: Circle system utilized Preoxygenation: Pre-oxygenation with 100% oxygen Induction Type: IV induction, Rapid sequence and Cricoid Pressure applied Laryngoscope Size: Mac and 4 Grade View: Grade I Tube type: Oral Tube size: 7.5 mm Number of attempts: 1 Airway Equipment and Method: Stylet Placement Confirmation: ETT inserted through vocal cords under direct vision, breath sounds checked- equal and bilateral and positive ETCO2 Secured at: 23 cm Tube secured with: Tape Dental Injury: Teeth and Oropharynx as per pre-operative assessment

## 2023-05-30 NOTE — Consult Note (Signed)
Urology Consult   Physician requesting consult: Dr. Madilyn Hook  Reason for consult: Right ureteral stone and uncontrolled pain  History of Present Illness: Anthony Roach is a 62 y.o. who developed severe right flank and abdominal pain with nausea two days ago.  He has a long history of recurrent urolithiasis and has been non-compliant with recommendations for follow up and preventive strategies.  He had a CT stone study that confirmed a 6 mm mid ureteral stone.  His pain has now migrated down to his right testis.   He has received ketorolac, hydromorphone, and ketamine without adequate pain control.  No fever.    He has been off his Eliquis for many days due to an inability to afford it.  Past Medical History:  Diagnosis Date   A-fib [facility])    Anxiety    Dysrhythmia    A Fib/Paroxysmal Atrial Fibrilation   Renal disorder    kidney stones   Stevens-Johnson syndrome (HCC) 2005    Past Surgical History:  Procedure Laterality Date   CARDIOVERSION N/A 04/26/2022   Procedure: CARDIOVERSION;  Surgeon: Little Ishikawa, MD;  Location: [facility] ENDOSCOPY;  Service: Cardiovascular;  Laterality: N/A;   Cysto, stents     CYSTOSCOPY W/ URETERAL STENT PLACEMENT Bilateral 03/06/2016   Procedure: CYSTOSCOPY WITH BILATERAL RETROGRADE PYELOGRAM/BILATERAL URETEROSCOPY AND BILATERAL LASER APPLICATION, BILATERAL URETERAL STENT PLACEMENT;  Surgeon: Sebastian Ache, MD;  Location: [facility] ORS;  Service: Urology;  Laterality: Bilateral;   Rotator Cuff Repair Left Shoulder     TEE WITHOUT CARDIOVERSION N/A 04/26/2022   Procedure: TRANSESOPHAGEAL ECHOCARDIOGRAM (TEE);  Surgeon: Little Ishikawa, MD;  Location: [facility] ENDOSCOPY;  Service: Cardiovascular;  Laterality: N/A;    Current Hospital Medications:  Home Meds:  No current facility-administered medications on file prior to encounter.   Current Outpatient Medications on File Prior to Encounter  Medication Sig Dispense Refill   apixaban (ELIQUIS) 5 MG TABS  tablet Take 1 tablet (5 mg total) by mouth 2 (two) times daily. 60 tablet 0   diltiazem (CARDIZEM CD) 120 MG 24 hr capsule Take 1 capsule (120 mg total) by mouth daily. 30 capsule 0   diltiazem (CARDIZEM) 30 MG tablet Take 1 tablet (30 mg total) by mouth daily as needed (for elevated heart rate >130). 30 tablet 0   escitalopram (LEXAPRO) 20 MG tablet Take 20 mg by mouth daily.     metoprolol succinate (TOPROL-XL) 25 MG 24 hr tablet Take 25 mg by mouth daily.     ondansetron (ZOFRAN-ODT) 4 MG disintegrating tablet Take 1 tablet (4 mg total) by mouth every 8 (eight) hours as needed for nausea or vomiting. 12 tablet 0   oxyCODONE-acetaminophen (PERCOCET/ROXICET) 5-325 MG tablet Take 1-2 tablets by mouth every 6 (six) hours as needed for severe pain. 20 tablet 0   tamsulosin (FLOMAX) 0.4 MG CAPS capsule Take 1 capsule (0.4 mg total) by mouth daily. 30 capsule 0     Scheduled Meds: Continuous Infusions: PRN Meds:.  Allergies:  Allergies  Allergen Reactions   Bee Venom Hives, Shortness Of Breath, Swelling and Other (See Comments)    Severity of reactions can vary    History reviewed. No pertinent family history.  Social History:  reports that he has quit smoking. He has never used smokeless tobacco. He reports current alcohol use. He reports that he does not use drugs.  ROS: A complete review of systems was performed.  All systems are negative except for pertinent findings as noted.  Physical Exam:  Vital  signs in last 24 hours: Temp:  [98.4 F (36.9 C)-98.8 F (37.1 C)] 98.4 F (36.9 C) (10/10 0432) Pulse Rate:  [77-87] 77 (10/10 0432) Resp:  [20] 20 (10/10 0432) BP: (127-130)/(59-79) 127/59 (10/10 0432) SpO2:  [95 %-98 %] 95 % (10/10 0432) Constitutional:  Alert and oriented, No acute distress Cardiovascular: Regular rate and rhythm, No JVD Respiratory: Normal respiratory effort GI: Severe right abdominal tenderness GU: Severe right CVA tenderness Lymphatic: No  lymphadenopathy Neurologic: Grossly intact, no focal deficits Psychiatric: Normal mood and affect  Laboratory Data:  Recent Labs    05/28/23 0822 05/30/23 0114  WBC 8.6 5.5  HGB 14.8 14.4  HCT 43.6 41.6  PLT 187 176    Recent Labs    05/28/23 0822 05/30/23 0114  NA 137 136  K 4.1 3.9  CL 105 106  GLUCOSE 112* 107*  BUN 17 18  CALCIUM 8.7* 9.0  CREATININE 0.97 1.29*     Results for orders placed or performed during the hospital encounter of 05/30/23 (from the past 24 hour(s))  Basic metabolic panel     Status: Abnormal   Collection Time: 05/30/23  1:14 AM  Result Value Ref Range   Sodium 136 135 - 145 mmol/L   Potassium 3.9 3.5 - 5.1 mmol/L   Chloride 106 98 - 111 mmol/L   CO2 21 (L) 22 - 32 mmol/L   Glucose, Bld 107 (H) 70 - 99 mg/dL   BUN 18 8 - 23 mg/dL   Creatinine, Ser 2.84 (H) 0.61 - 1.24 mg/dL   Calcium 9.0 8.9 - 13.2 mg/dL   GFR, Estimated >44 >01 mL/min   Anion gap 9 5 - 15  CBC with Differential     Status: None   Collection Time: 05/30/23  1:14 AM  Result Value Ref Range   WBC 5.5 4.0 - 10.5 K/uL   RBC 4.43 4.22 - 5.81 MIL/uL   Hemoglobin 14.4 13.0 - 17.0 g/dL   HCT 02.7 25.3 - 66.4 %   MCV 93.9 80.0 - 100.0 fL   MCH 32.5 26.0 - 34.0 pg   MCHC 34.6 30.0 - 36.0 g/dL   RDW 40.3 47.4 - 25.9 %   Platelets 176 150 - 400 K/uL   nRBC 0.0 0.0 - 0.2 %   Neutrophils Relative % 60 %   Neutro Abs 3.3 1.7 - 7.7 K/uL   Lymphocytes Relative 28 %   Lymphs Abs 1.5 0.7 - 4.0 K/uL   Monocytes Relative 8 %   Monocytes Absolute 0.4 0.1 - 1.0 K/uL   Eosinophils Relative 3 %   Eosinophils Absolute 0.2 0.0 - 0.5 K/uL   Basophils Relative 1 %   Basophils Absolute 0.1 0.0 - 0.1 K/uL   Immature Granulocytes 0 %   Abs Immature Granulocytes 0.01 0.00 - 0.07 K/uL   No results found for this or any previous visit (from the past 240 hour(s)).  Renal Function: Recent Labs    05/28/23 0822 05/30/23 0114  CREATININE 0.97 1.29*   Estimated Creatinine Clearance:  68.9 mL/min (A) (by C-G formula based on SCr of 1.29 mg/dL (H)).  Radiologic Imaging: DG Abdomen 1 View  Result Date: 05/30/2023 CLINICAL DATA:  Flank pain. EXAM: ABDOMEN - 1 VIEW COMPARISON:  05/28/2023. FINDINGS: The bowel gas pattern is normal. Renal calculi are noted bilaterally. There is a 5 mm calcification overlying the sacrum on the right, possible ureteral calculus. Stable phleboliths are noted in the pelvis on the right. IMPRESSION: 1. Bilateral nephrolithiasis. 2.  5 mm calcification projecting over the sacrum on the right, possible ureteral calculus. Electronically Signed   By: Thornell Sartorius M.D.   On: 05/30/2023 03:28   CT RENAL STONE STUDY  Result Date: 05/28/2023 CLINICAL DATA:  Right flank pain.  Stone EXAM: CT ABDOMEN AND PELVIS WITHOUT CONTRAST TECHNIQUE: Multidetector CT imaging of the abdomen and pelvis was performed following the standard protocol without IV contrast. RADIATION DOSE REDUCTION: This exam was performed according to the departmental dose-optimization program which includes automated exposure control, adjustment of the mA and/or kV according to patient size and/or use of iterative reconstruction technique. COMPARISON:  CT 11/13/2017 FINDINGS: Lower chest: There is some linear opacity at the bases likely scar or atelectasis. No pleural effusion. Evaluation significantly limited by motion. Hepatobiliary: Evaluation limited by motion. Grossly preserved hepatic parenchyma. Gallbladder is nondilated. Pancreas: Obscured by motion. Grossly preserved pancreatic parenchyma. Spleen: Nonenlarged.  Limited by motion. Adrenals/Urinary Tract: The adrenal glands are grossly preserved. There are numerous bilateral nonobstructing renal stones. At least 8 left-sided stones are seen measuring up to 10 mm. Similar number on the right with the largest towards the lower pole measuring 12 mm. There is some perinephric stranding, right-greater-than-left. Right kidney is enlarged compared to left  as well. Mild right-sided renal collecting system dilatation with a stone in the mid right ureter measuring up to 6 mm in cephalocaudal length. No additional ureteral stones. Preserved contours of the urinary bladder. Stomach/Bowel: On this non oral contrast exam, large bowel has a normal course and caliber with scattered stool. Stomach is mildly distended with fluid. Small bowel is nondilated. Poor visualization of the appendix. No pericecal stranding or fluid. Study limited by motion. Vascular/Lymphatic: Aortic atherosclerosis. No enlarged abdominal or pelvic lymph nodes. Reproductive: Lobular prostate. Please correlate with patient's PSA. 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 Please correlate with history. Other: Evaluation limited by motion. No obvious free air or free fluid. Musculoskeletal: Degenerative changes of the spine and pelvis. IMPRESSION: Study significantly by motion. Numerous bilateral renal stones identified. There is a proximally 6 mm stone in the mid right ureter with mild proximal collecting system dilatation. No bowel obstruction. Enlarged prostate with lobular margins. Please correlate with patient's PSA Electronically Signed   By: Karen Kays M.D.   On: 05/28/2023 10:53    I independently reviewed the above imaging studies.  Impression/Recommendation  Right ureteral calculus:  His pain is uncontrolled.  I have recommended proceeding to the OR for cystoscopy, right retrograde pyelogram, right ureteroscopy with laser lithotripsy and right ureteral stent.  I discussed the potential benefits and risks of the procedure, side effects of the proposed treatment, the likelihood of the patient achieving the goals of the procedure, and any potential problems that might occur during the procedure or recuperation. He gives informed consent.   Crecencio Mc 05/30/2023, 6:58 AM    Moody Bruins MD   CC: Dr. Madilyn Hook

## 2023-05-30 NOTE — ED Triage Notes (Signed)
Pt reports having 6mm kidney stone but reports pain has gotten much worse today

## 2023-05-31 ENCOUNTER — Encounter (HOSPITAL_COMMUNITY): Payer: Self-pay | Admitting: Urology

## 2023-06-01 DIAGNOSIS — N201 Calculus of ureter: Secondary | ICD-10-CM | POA: Diagnosis not present

## 2023-11-19 ENCOUNTER — Other Ambulatory Visit: Payer: Self-pay

## 2023-11-19 ENCOUNTER — Observation Stay (HOSPITAL_COMMUNITY)
Admission: EM | Admit: 2023-11-19 | Discharge: 2023-11-21 | Disposition: A | Discharge status: Home / Self Care | Attending: Internal Medicine | Admitting: Internal Medicine

## 2023-11-19 ENCOUNTER — Observation Stay (HOSPITAL_BASED_OUTPATIENT_CLINIC_OR_DEPARTMENT_OTHER)

## 2023-11-19 ENCOUNTER — Encounter (HOSPITAL_COMMUNITY): Payer: Self-pay | Admitting: Emergency Medicine

## 2023-11-19 ENCOUNTER — Emergency Department (HOSPITAL_COMMUNITY)

## 2023-11-19 DIAGNOSIS — I4891 Unspecified atrial fibrillation: Secondary | ICD-10-CM | POA: Diagnosis not present

## 2023-11-19 DIAGNOSIS — Z87891 Personal history of nicotine dependence: Secondary | ICD-10-CM | POA: Insufficient documentation

## 2023-11-19 DIAGNOSIS — Z7901 Long term (current) use of anticoagulants: Secondary | ICD-10-CM | POA: Diagnosis not present

## 2023-11-19 DIAGNOSIS — R110 Nausea: Secondary | ICD-10-CM | POA: Insufficient documentation

## 2023-11-19 DIAGNOSIS — D696 Thrombocytopenia, unspecified: Secondary | ICD-10-CM | POA: Diagnosis not present

## 2023-11-19 DIAGNOSIS — R002 Palpitations: Secondary | ICD-10-CM | POA: Diagnosis not present

## 2023-11-19 DIAGNOSIS — Z6833 Body mass index (BMI) 33.0-33.9, adult: Secondary | ICD-10-CM | POA: Diagnosis not present

## 2023-11-19 DIAGNOSIS — I700 Atherosclerosis of aorta: Secondary | ICD-10-CM | POA: Diagnosis not present

## 2023-11-19 DIAGNOSIS — N200 Calculus of kidney: Secondary | ICD-10-CM | POA: Diagnosis not present

## 2023-11-19 DIAGNOSIS — I480 Paroxysmal atrial fibrillation: Secondary | ICD-10-CM | POA: Diagnosis not present

## 2023-11-19 DIAGNOSIS — F419 Anxiety disorder, unspecified: Secondary | ICD-10-CM | POA: Diagnosis not present

## 2023-11-19 DIAGNOSIS — E66811 Obesity, class 1: Secondary | ICD-10-CM | POA: Diagnosis not present

## 2023-11-19 DIAGNOSIS — Z79899 Other long term (current) drug therapy: Secondary | ICD-10-CM | POA: Insufficient documentation

## 2023-11-19 DIAGNOSIS — R0602 Shortness of breath: Secondary | ICD-10-CM | POA: Diagnosis not present

## 2023-11-19 LAB — BASIC METABOLIC PANEL WITH GFR
Anion gap: 8 (ref 5–15)
BUN: 18 mg/dL (ref 8–23)
CO2: 23 mmol/L (ref 22–32)
Calcium: 8.7 mg/dL — ABNORMAL LOW (ref 8.9–10.3)
Chloride: 106 mmol/L (ref 98–111)
Creatinine, Ser: 1.2 mg/dL (ref 0.61–1.24)
GFR, Estimated: >60 mL/min (ref >60)
Glucose, Bld: 93 mg/dL (ref 70–99)
Potassium: 3.8 mmol/L (ref 3.5–5.1)
Sodium: 137 mmol/L (ref 135–145)

## 2023-11-19 LAB — HEPATIC FUNCTION PANEL
ALT: 22 U/L (ref 0–44)
AST: 21 U/L (ref 15–41)
Albumin: 3.6 g/dL (ref 3.5–5.0)
Alkaline Phosphatase: 50 U/L (ref 38–126)
Bilirubin, Direct: 0.2 mg/dL (ref 0.0–0.2)
Indirect Bilirubin: 0.8 mg/dL (ref 0.3–0.9)
Total Bilirubin: 1 mg/dL (ref 0.0–1.2)
Total Protein: 6.2 g/dL — ABNORMAL LOW (ref 6.5–8.1)

## 2023-11-19 LAB — CBC
HCT: 41.3 % (ref 39.0–52.0)
Hemoglobin: 14.3 g/dL (ref 13.0–17.0)
MCH: 32.6 pg (ref 26.0–34.0)
MCHC: 34.6 g/dL (ref 30.0–36.0)
MCV: 94.1 fL (ref 80.0–100.0)
Platelets: 147 10*3/uL — ABNORMAL LOW (ref 150–400)
RBC: 4.39 MIL/uL (ref 4.22–5.81)
RDW: 12 % (ref 11.5–15.5)
WBC: 5 10*3/uL (ref 4.0–10.5)
nRBC: 0 % (ref 0.0–0.2)

## 2023-11-19 LAB — ECHOCARDIOGRAM COMPLETE
AR max vel: 3.05 cm2
AV Area VTI: 3.1 cm2
AV Area mean vel: 2.89 cm2
AV Mean grad: 2 mmHg
AV Peak grad: 4 mmHg
Ao pk vel: 1 m/s
Area-P 1/2: 5.52 cm2
Calc EF: 68.2 %
Height: 69 in
MV VTI: 4.81 cm2
S' Lateral: 2.9 cm
Single Plane A2C EF: 69 %
Single Plane A4C EF: 68.2 %
Weight: 3600 [oz_av]

## 2023-11-19 LAB — HIV ANTIBODY (ROUTINE TESTING W REFLEX): HIV Screen 4th Generation wRfx: NONREACTIVE

## 2023-11-19 LAB — TROPONIN I (HIGH SENSITIVITY)
Troponin I (High Sensitivity): 3 ng/L (ref <18)
Troponin I (High Sensitivity): 3 ng/L (ref <18)

## 2023-11-19 LAB — TSH: TSH: 1.708 u[IU]/mL (ref 0.350–4.500)

## 2023-11-19 LAB — HEPARIN LEVEL (UNFRACTIONATED): Heparin Unfractionated: 0.33 [IU]/mL (ref 0.30–0.70)

## 2023-11-19 LAB — LIPASE, BLOOD: Lipase: 40 U/L (ref 11–51)

## 2023-11-19 LAB — MRSA NEXT GEN BY PCR, NASAL: MRSA by PCR Next Gen: NOT DETECTED

## 2023-11-19 MED ORDER — APIXABAN 5 MG PO TABS
5.0000 mg | ORAL_TABLET | Freq: Two times a day (BID) | ORAL | Status: DC
  Administered 2023-11-19 – 2023-11-20 (×3): 5 mg via ORAL
  Filled 2023-11-19 (×4): qty 1

## 2023-11-19 MED ORDER — ONDANSETRON HCL 4 MG/2ML IJ SOLN
4.0000 mg | Freq: Once | INTRAMUSCULAR | Status: AC
  Administered 2023-11-19: 4 mg via INTRAVENOUS
  Filled 2023-11-19: qty 2

## 2023-11-19 MED ORDER — METOPROLOL SUCCINATE ER 25 MG PO TB24
25.0000 mg | ORAL_TABLET | Freq: Every day | ORAL | Status: DC
  Administered 2023-11-19 – 2023-11-20 (×2): 25 mg via ORAL
  Filled 2023-11-19 (×2): qty 1

## 2023-11-19 MED ORDER — HEPARIN (PORCINE) 25000 UT/250ML-% IV SOLN
1200.0000 [IU]/h | INTRAVENOUS | Status: DC
  Administered 2023-11-19: 1200 [IU]/h via INTRAVENOUS
  Filled 2023-11-19: qty 250

## 2023-11-19 MED ORDER — HEPARIN BOLUS VIA INFUSION
4000.0000 [IU] | Freq: Once | INTRAVENOUS | Status: AC
  Administered 2023-11-19: 4000 [IU] via INTRAVENOUS
  Filled 2023-11-19: qty 4000

## 2023-11-19 MED ORDER — ORAL CARE MOUTH RINSE: 15.0000 mL | OROMUCOSAL | Status: DC | PRN

## 2023-11-19 MED ORDER — DILTIAZEM HCL-DEXTROSE 125-5 MG/125ML-% IV SOLN (PREMIX)
5.0000 mg/h | INTRAVENOUS | Status: DC
  Administered 2023-11-19: 5 mg/h via INTRAVENOUS
  Filled 2023-11-19: qty 125

## 2023-11-19 MED ORDER — ONDANSETRON HCL 4 MG/2ML IJ SOLN: 4.0000 mg | Freq: Four times a day (QID) | INTRAMUSCULAR | Status: DC | PRN

## 2023-11-19 MED ORDER — ESCITALOPRAM OXALATE 20 MG PO TABS
20.0000 mg | ORAL_TABLET | Freq: Every day | ORAL | Status: DC
  Administered 2023-11-19 – 2023-11-21 (×3): 20 mg via ORAL
  Filled 2023-11-19 (×3): qty 1

## 2023-11-19 MED ORDER — DILTIAZEM LOAD VIA INFUSION
15.0000 mg | Freq: Once | INTRAVENOUS | Status: AC
  Administered 2023-11-19: 15 mg via INTRAVENOUS
  Filled 2023-11-19: qty 15

## 2023-11-19 MED ORDER — ACETAMINOPHEN 325 MG PO TABS
650.0000 mg | ORAL_TABLET | Freq: Four times a day (QID) | ORAL | Status: DC | PRN
  Administered 2023-11-19: 650 mg via ORAL
  Filled 2023-11-19: qty 2

## 2023-11-19 MED ORDER — CHLORHEXIDINE GLUCONATE CLOTH 2 % EX PADS
6.0000 | MEDICATED_PAD | Freq: Every day | CUTANEOUS | Status: DC
Start: 2023-11-19 — End: 2023-11-21
  Administered 2023-11-19 – 2023-11-20 (×2): 6 via TOPICAL

## 2023-11-19 MED ORDER — DILTIAZEM HCL 30 MG PO TABS
30.0000 mg | ORAL_TABLET | Freq: Four times a day (QID) | ORAL | Status: DC | PRN
  Filled 2023-11-19: qty 1

## 2023-11-19 MED ORDER — DILTIAZEM HCL ER COATED BEADS 120 MG PO CP24
120.0000 mg | ORAL_CAPSULE | Freq: Every day | ORAL | Status: DC
  Administered 2023-11-19 – 2023-11-20 (×2): 120 mg via ORAL
  Filled 2023-11-19 (×3): qty 1

## 2023-11-19 NOTE — Consult Note (Incomplete)
 Cardiology Consultation   Patient ID: Anthony Roach MRN: [id number]; DOB: 07/02/1961  Admit date: 11/19/2023 Date of Consult: 11/19/2023  PCP:  Tracey Harries, MD   [facility] Providers Cardiologist:  Donato Schultz, MD  Electrophysiologist:  Maurice Small, MD  { Click here to update MD or APP on Care Team, Refresh:1}     Patient Profile:   Anthony Roach is a 63 y.o. male with a history of paroxysmal atrial fibrillation on Eliquis, anxiety, nephrolithiasis, and Stevens-Johnson syndrome who is being seen 11/19/2023 for the evaluation of atrial fibrillation at the request of Dr. Idelle Leech.  History of Present Illness:   Anthony Roach is a 63 year old male with the above history. He has a history of intermittent atrial fibrillation since age 49. He was remotely treated with Digoxin. He has been seen multiple times in the ED for atrial fibrillation over the years and has undergone multiple DCCVs. He was admitted in 04/2022 for atrial fibrillation with RVR. He was started on IV Cardizem and then underwent TEE/ DCCV with restoration of sinus rhythm. He was lost to follow-up following this admission. He was seen in the ED in 08/2022 for atrial fibrillation. He was cardioverted in the ED and referred to the [facility]Fib [facility]; however, it does not look like he was ever seen by them.   Patient presented to the ED on 11/19/2023 for palpitations with associated chest/ neck discomfort and shortness of breath. Upon arrival to the ED, EKG showed atrial fibrillation, rate 117 bpm, with no acute ischemic changes compared to prior tracings. High-sensitivity troponin negative x2. WBC 5.0, Hgb 14.3, Plts 147. Na 137, K 3.8, Glucose 93, BUN 18, Cr 1.20. TSH normal. Patient was admitted and started on IV Cardizem.   ***   Past Medical History:  Diagnosis Date   A-fib [facility])    Anxiety    Dysrhythmia    A Fib/Paroxysmal Atrial Fibrilation   Renal disorder    kidney stones   Stevens-Johnson  syndrome (HCC) 2005    Past Surgical History:  Procedure Laterality Date   CARDIOVERSION N/A 04/26/2022   Procedure: CARDIOVERSION;  Surgeon: Little Ishikawa, MD;  Location: [facility] ENDOSCOPY;  Service: Cardiovascular;  Laterality: N/A;   Cysto, stents     CYSTOSCOPY W/ URETERAL STENT PLACEMENT Bilateral 03/06/2016   Procedure: CYSTOSCOPY WITH BILATERAL RETROGRADE PYELOGRAM/BILATERAL URETEROSCOPY AND BILATERAL LASER APPLICATION, BILATERAL URETERAL STENT PLACEMENT;  Surgeon: Sebastian Ache, MD;  Location: [facility] ORS;  Service: Urology;  Laterality: Bilateral;   CYSTOSCOPY WITH RETROGRADE PYELOGRAM, URETEROSCOPY AND STENT PLACEMENT Right 05/30/2023   Procedure: CYSTOSCOPY WITH RETROGRADE PYELOGRAM, URETEROSCOPY AND STENT PLACEMENT,HOLIUM LASER LITHOTRIPSY;  Surgeon: Heloise Purpura, MD;  Location: [facility] ORS;  Service: Urology;  Laterality: Right;   Rotator Cuff Repair Left Shoulder     TEE WITHOUT CARDIOVERSION N/A 04/26/2022   Procedure: TRANSESOPHAGEAL ECHOCARDIOGRAM (TEE);  Surgeon: Little Ishikawa, MD;  Location: [facility] ENDOSCOPY;  Service: Cardiovascular;  Laterality: N/A;     {Home Medications (Optional):21181}  Inpatient Medications: Scheduled Meds:  apixaban  5 mg Oral BID   Chlorhexidine Gluconate Cloth  6 each Topical Daily   diltiazem  120 mg Oral Daily   escitalopram  20 mg Oral Daily   metoprolol succinate  25 mg Oral Daily   Continuous Infusions:  diltiazem (CARDIZEM) infusion Stopped (11/19/23 0732)   PRN Meds: acetaminophen, diltiazem, ondansetron (ZOFRAN) IV, mouth rinse  Allergies:    Allergies  Allergen Reactions   Bee Venom Hives, Shortness Of  Breath, Swelling and Other (See Comments)    Severity of reactions can vary    Social History:   Social History   Socioeconomic History   Marital status: Married    Spouse name: Not on file   Number of children: Not on file   Years of education: Not on file   Highest education level: Not on file  Occupational History    Not on file  Tobacco Use   Smoking status: Former   Smokeless tobacco: Never  Substance and Sexual Activity   Alcohol use: Yes    Comment: occ   Drug use: No   Sexual activity: Yes  Other Topics Concern   Not on file  Social History Narrative   Not on file   Social Drivers of Health   Financial Resource Strain: Patient Declined (04/10/2023)   [facility]   Overall Financial Resource Strain (CARDIA)    Difficulty of Paying Living Expenses: Patient declined  Food Insecurity: No Food Insecurity (11/19/2023)   Hunger Vital Sign    Worried About Running Out of Food in the Last Year: Never true    Ran Out of Food in the Last Year: Never true  Transportation Needs: No Transportation Needs (11/19/2023)   PRAPARE - Administrator, Civil Service (Medical): No    Lack of Transportation (Non-Medical): No  Physical Activity: Unknown (04/10/2023)   Received from [facility]   Exercise Vital Sign    Days of Exercise per Week: Patient declined    Minutes of Exercise per Session: Not on file  Stress: Patient Declined (04/10/2023)   Received from [facility] - Occupational Stress Questionnaire    Feeling of Stress : Patient declined  Social Connections: Socially Isolated (11/19/2023)   Social Connection and Isolation Panel [NHANES]    Frequency of Communication with Friends and Family: Never    Frequency of Social Gatherings with Friends and Family: Never    Attends Religious Services: Never    Database administrator or Organizations: No    Attends Banker Meetings: Never    Marital Status: Married  Catering manager Violence: Not At Risk (11/19/2023)   Humiliation, Afraid, Rape, and Kick questionnaire    Fear of Current or Ex-Partner: No    Emotionally Abused: No    Physically Abused: No    Sexually Abused: No    Family History:   ***History reviewed. No pertinent family history.   ROS:  Please see  the history of present illness.  *** All other ROS reviewed and negative.     Physical Exam/Data:   Vitals:   11/19/23 1600 11/19/23 1700 11/19/23 1800 11/19/23 1900  BP: (!) 136/56 (!) 148/54 119/62   Pulse: 74 78 77   Resp: (!) 9 (!) 22 16 20   Temp: 98.6 F (37 C)     TempSrc: Oral     SpO2: 92% 95% 94%   Weight:      Height:        Intake/Output Summary (Last 24 hours) at 11/19/2023 2042 Last data filed at 11/19/2023 1400 Gross per 24 hour  Intake 170.29 ml  Output 1500 ml  Net -1329.71 ml      11/19/2023    3:05 AM 05/30/2023    9:48 AM 05/28/2023    8:12 AM  Last 3 Weights  Weight (lbs) 225 lb 205 lb 205 lb  Weight (kg) 102.059 kg 92.987 kg 92.987 kg  Body mass index is 33.23 kg/m.  General:  Well nourished, well developed, in no acute distress*** HEENT: normal Neck: no JVD Vascular: No carotid bruits; Distal pulses 2+ bilaterally Cardiac:  normal S1, S2; RRR; no murmur *** Lungs:  clear to auscultation bilaterally, no wheezing, rhonchi or rales  Abd: soft, nontender, no hepatomegaly  Ext: no edema Musculoskeletal:  No deformities, BUE and BLE strength normal and equal Skin: warm and dry  Neuro:  CNs 2-12 intact, no focal abnormalities noted Psych:  Normal affect   EKG:  The EKG was personally reviewed and demonstrates:  *** Telemetry:  Telemetry was personally reviewed and demonstrates:  ***  Relevant CV Studies:  Echocardiogram 11/19/2023: Impressions:  1. Left ventricular ejection fraction, by estimation, is 60 to 65%. The  left ventricle has normal function. The left ventricle has no regional  wall motion abnormalities. Left ventricular diastolic function could not  be evaluated.   2. Right ventricular systolic function is normal. The right ventricular  size is normal. Tricuspid regurgitation signal is inadequate for assessing  PA pressure.   3. The mitral valve is normal in structure. No evidence of mitral valve  regurgitation. No evidence of  mitral stenosis.   4. The aortic valve is tricuspid. Aortic valve regurgitation is not  visualized. No aortic stenosis is present.   5. The inferior vena cava is normal in size with greater than 50%  respiratory variability, suggesting right atrial pressure of 3 mmHg.   Laboratory Data:  High Sensitivity Troponin:   Recent Labs  Lab 11/19/23 0538 11/19/23 0754  TROPONINIHS 3 3     Chemistry Recent Labs  Lab 11/19/23 0313  NA 137  K 3.8  CL 106  CO2 23  GLUCOSE 93  BUN 18  CREATININE 1.20  CALCIUM 8.7*  GFRNONAA >60  ANIONGAP 8    Recent Labs  Lab 11/19/23 0754  PROT 6.2*  ALBUMIN 3.6  AST 21  ALT 22  ALKPHOS 50  BILITOT 1.0   Lipids No results for input(s): "CHOL", "TRIG", "HDL", "LABVLDL", "LDLCALC", "CHOLHDL" in the last 168 hours.  Hematology Recent Labs  Lab 11/19/23 0313  WBC 5.0  RBC 4.39  HGB 14.3  HCT 41.3  MCV 94.1  MCH 32.6  MCHC 34.6  RDW 12.0  PLT 147*   Thyroid  Recent Labs  Lab 11/19/23 0538  TSH 1.708    BNPNo results for input(s): "BNP", "PROBNP" in the last 168 hours.  DDimer No results for input(s): "DDIMER" in the last 168 hours.   Radiology/Studies:  ECHOCARDIOGRAM COMPLETE Result Date: 11/19/2023    ECHOCARDIOGRAM REPORT   Patient Name:   KOWEN KLUTH Date of Exam: 11/19/2023 Medical Rec #:  [id number]           Height:       69.0 in Accession #:    [id number]          Weight:       225.0 lb Date of Birth:  September 06, 1960            BSA:          2.172 m Patient Age:    63 years            BP:           113/75 mmHg Patient Gender: M                   HR:  89 bpm. Exam Location:  Inpatient Procedure: 2D Echo, Cardiac Doppler and Color Doppler (Both Spectral and Color            Flow Doppler were utilized during procedure). Indications:    Atrial fibrillation  History:        Patient has prior history of Echocardiogram examinations, most                 recent 04/25/2022. Signs/Symptoms:Chest Pain; Risk                  Factors:Dyslipidemia.  Sonographer:    Vern Claude Referring Phys: 6 ARSHAD N KAKRAKANDY IMPRESSIONS  1. Left ventricular ejection fraction, by estimation, is 60 to 65%. The left ventricle has normal function. The left ventricle has no regional wall motion abnormalities. Left ventricular diastolic function could not be evaluated.  2. Right ventricular systolic function is normal. The right ventricular size is normal. Tricuspid regurgitation signal is inadequate for assessing PA pressure.  3. The mitral valve is normal in structure. No evidence of mitral valve regurgitation. No evidence of mitral stenosis.  4. The aortic valve is tricuspid. Aortic valve regurgitation is not visualized. No aortic stenosis is present.  5. The inferior vena cava is normal in size with greater than 50% respiratory variability, suggesting right atrial pressure of 3 mmHg. FINDINGS  Left Ventricle: Left ventricular ejection fraction, by estimation, is 60 to 65%. The left ventricle has normal function. The left ventricle has no regional wall motion abnormalities. The left ventricular internal cavity size was normal in size. There is  no left ventricular hypertrophy. Left ventricular diastolic function could not be evaluated due to atrial fibrillation. Left ventricular diastolic function could not be evaluated. Right Ventricle: The right ventricular size is normal. No increase in right ventricular wall thickness. Right ventricular systolic function is normal. Tricuspid regurgitation signal is inadequate for assessing PA pressure. Left Atrium: Left atrial size was normal in size. Right Atrium: Right atrial size was normal in size. Pericardium: There is no evidence of pericardial effusion. Mitral Valve: The mitral valve is normal in structure. No evidence of mitral valve regurgitation. No evidence of mitral valve stenosis. MV peak gradient, 1.8 mmHg. The mean mitral valve gradient is 1.0 mmHg. Tricuspid Valve: The tricuspid valve is normal  in structure. Tricuspid valve regurgitation is trivial. No evidence of tricuspid stenosis. Aortic Valve: The aortic valve is tricuspid. Aortic valve regurgitation is not visualized. No aortic stenosis is present. Aortic valve mean gradient measures 2.0 mmHg. Aortic valve peak gradient measures 4.0 mmHg. Aortic valve area, by VTI measures 3.10 cm. Pulmonic Valve: The pulmonic valve was normal in structure. Pulmonic valve regurgitation is trivial. No evidence of pulmonic stenosis. Aorta: The aortic root is normal in size and structure. Venous: The inferior vena cava is normal in size with greater than 50% respiratory variability, suggesting right atrial pressure of 3 mmHg. IAS/Shunts: No atrial level shunt detected by color flow Doppler.  LEFT VENTRICLE PLAX 2D LVIDd:         4.30 cm LVIDs:         2.90 cm LV PW:         0.80 cm LV IVS:        0.70 cm LVOT diam:     1.90 cm LV SV:         52 LV SV Index:   24 LVOT Area:     2.84 cm  LV Volumes (MOD) LV vol d, MOD A2C: 126.0  ml LV vol d, MOD A4C: 127.0 ml LV vol s, MOD A2C: 39.0 ml LV vol s, MOD A4C: 40.4 ml LV SV MOD A2C:     87.0 ml LV SV MOD A4C:     127.0 ml LV SV MOD BP:      86.7 ml RIGHT VENTRICLE             IVC RV Basal diam:  2.70 cm     IVC diam: 1.80 cm RV Mid diam:    1.70 cm RV S prime:     12.20 cm/s TAPSE (M-mode): 2.4 cm LEFT ATRIUM             Index        RIGHT ATRIUM          Index LA diam:        3.70 cm 1.70 cm/m   RA Area:     8.72 cm LA Vol (A2C):   28.8 ml 13.26 ml/m  RA Volume:   14.90 ml 6.86 ml/m LA Vol (A4C):   32.3 ml 14.87 ml/m LA Biplane Vol: 32.0 ml 14.73 ml/m  AORTIC VALVE                    PULMONIC VALVE AV Area (Vmax):    3.05 cm     PV Vmax:       0.71 m/s AV Area (Vmean):   2.89 cm     PV Peak grad:  2.0 mmHg AV Area (VTI):     3.10 cm AV Vmax:           99.70 cm/s AV Vmean:          64.700 cm/s AV VTI:            0.167 m AV Peak Grad:      4.0 mmHg AV Mean Grad:      2.0 mmHg LVOT Vmax:         107.27 cm/s LVOT Vmean:         66.000 cm/s LVOT VTI:          0.183 m LVOT/AV VTI ratio: 1.09  AORTA Ao Root diam: 4.00 cm Ao Asc diam:  3.70 cm MITRAL VALVE MV Area (PHT): 5.52 cm    SHUNTS MV Area VTI:   4.81 cm    Systemic VTI:  0.18 m MV Peak grad:  1.8 mmHg    Systemic Diam: 1.90 cm MV Mean grad:  1.0 mmHg MV Vmax:       0.67 m/s MV Vmean:      37.5 cm/s MV Decel Time: 138 msec MV E velocity: 77.75 cm/s Clearnce Hasten Electronically signed by Clearnce Hasten Signature Date/Time: 11/19/2023/11:00:58 AM    Final    DG Chest Port 1 View Result Date: 11/19/2023 CLINICAL DATA:  AFib in shortness of breath. EXAM: PORTABLE CHEST 1 VIEW COMPARISON:  04/24/2022 FINDINGS: Stable cardiomediastinal silhouette. Aortic atherosclerotic calcification. No focal consolidation, pleural effusion, or pneumothorax. No displaced rib fractures. IMPRESSION: No active disease. Electronically Signed   By: Minerva Fester M.D.   On: 11/19/2023 03:32     Assessment and Plan:   Paroxysmal Atrial Fibrillation Patient has a long history of paroxysmal atrial fibrillation dating back to his 54s. He has undergone multiple cardioversions for this in the past (usually in the ED), most recently in 08/2022. He now presents with palpitations with associated *** and was noted to be in rapid atrial fibrillation. Echo showed normal LV function.  He was started on IV Cardizem. - *** - Continue IV Cardizem.  - CHA2DS2-VASc = 0. However, he has been on Eliquis 5mg  twice daily at home. *** - Plan is for TEE/ DCCV today. ***  Otherwise, per primary team: - Nausea - Anxiety  Risk Assessment/Risk Scores:    CHA2DS2-VASc Score = 0  This indicates a 0.2% annual risk of stroke. The patient's score is based upon: CHF History: 0 HTN History: 0 Diabetes History: 0 Stroke History: 0 Vascular Disease History: 0 Age Score: 0 Gender Score: 0    For questions or updates, please contact [facility] Please consult [email or web address] for contact info under     Signed, Corrin Parker, PA-C  11/19/2023 8:42 PM

## 2023-11-19 NOTE — Care Plan (Signed)
 This 63 years old Male with PMH significant for intermittent atrial fibrillation since age 29 presented in the ED with complaints of palpitations.  Patient states he woke up in the morning to go to the bathroom following which she started having palpitations. Patient denies any chest pain.  He has been taking Cardizem and metoprolol, He stopped taking Eliquis due to the cost.  He was last cardioverted on 04/26/2022.   Patient was in A-fib with RVR in the ED and started on Cardizem and heparin infusion. Echo showed LVEF 60 to 65%, Normal LV function.  Patient has CHA2DS2-VASc score 0.  Troponin WNL, TSH 1.70.  Heart rate well-controlled.  Patient resumed on home medications.  Heparin discontinued. Discussed with cardiologist Dr. Jacques Navy at curbside.  Continue home medications. Follow-up A-fib clinic.  Patient was seen and examined at bedside.  He denies any chest pain or shortness of breath.

## 2023-11-19 NOTE — Progress Notes (Signed)
 PHARMACY - ANTICOAGULATION CONSULT NOTE  Pharmacy Consult for Heparin Indication: atrial fibrillation  Allergies  Allergen Reactions   Bee Venom Hives, Shortness Of Breath, Swelling and Other (See Comments)    Severity of reactions can vary    Patient Measurements: Height: 5\' 9"  (175.3 cm) Weight: 102.1 kg (225 lb) IBW/kg (Calculated) : 70.7 HEPARIN DW (KG): 92.5  Vital Signs: Temp: 98.2 F (36.8 C) (04/01 0309) Temp Source: Oral (04/01 0309) BP: 125/96 (04/01 0430) Pulse Rate: 66 (04/01 0445)  Labs: Recent Labs    11/19/23 0313  HGB 14.3  HCT 41.3  PLT 147*  CREATININE 1.20    Estimated Creatinine Clearance: 75.2 mL/min (by C-G formula based on SCr of 1.2 mg/dL).   Medical History: Past Medical History:  Diagnosis Date   A-fib [facility])    Anxiety    Dysrhythmia    A Fib/Paroxysmal Atrial Fibrilation   Renal disorder    kidney stones   Stevens-Johnson syndrome (HCC) 2005    Medications:  Infusions:   diltiazem (CARDIZEM) infusion 5 mg/hr (11/19/23 0402)    Assessment: 63 yo M with PAF.  Pharmacy consulted to dose IV heparin.  He has been prescribed DOAC in the past but not taking b/c couldn't afford to get med filled.  Baseline labs: Hg WNL, pltc slightly low at 147, Scr 1.2  Goal of Therapy:  Heparin level 0.3-0.7 units/ml Monitor platelets by anticoagulation protocol: Yes   Plan:  Heparin 4000 units IV bolus x1 Heparin infusion at 1200 units/hr  Check heparin level 6h after heparin initiated Daily heparin level & CBC while on heparin  Junita Push PharmD 11/19/2023,5:27 AM

## 2023-11-19 NOTE — Progress Notes (Signed)
   11/19/23 1015  TOC Brief Assessment  Insurance and Status Reviewed  Patient has primary care physician Yes  Home environment has been reviewed Resides in single family home with spouse  Prior level of function: Independent with ADLs at baseline  Prior/Current Home Services No current home services  Social Drivers of Health Review SDOH reviewed no interventions necessary  Readmission risk has been reviewed Yes  Transition of care needs no transition of care needs at this time

## 2023-11-19 NOTE — ED Provider Notes (Signed)
 [place] EMERGENCY DEPARTMENT AT [facility] Provider Note  CSN: [id number] Arrival date & time: 11/19/23 0255  Chief Complaint(s) Atrial Fibrillation  HPI Anthony Roach is a 63 y.o. male with a past medical history listed below including paroxysmal A-fib not on anticoagulation due to financial constraints currently on 120 mg daily of diltiazem here in A-fib RVR.  Patient reports feeling like it started this evening while laying back down for bed.  Reports that it feels like a irregular beat in his chest with pressure in his chest and neck.  No recent fevers or infections.  No cough or congestion.  He endorses associated nausea without emesis.  No other physical complaints.  The history is provided by the patient.    Past Medical History Past Medical History:  Diagnosis Date   A-fib [facility])    Anxiety    Dysrhythmia    A Fib/Paroxysmal Atrial Fibrilation   Renal disorder    kidney stones   Stevens-Johnson syndrome (HCC) 2005   Patient Active Problem List   Diagnosis Date Noted   Major depression, recurrent, chronic (HCC) 05/24/2022   Nicotine dependence, cigarettes, uncomplicated 05/24/2022   Social anxiety disorder 05/24/2022   Chest pain 04/25/2022   Dehydration 04/25/2022   Hypokalemia 04/25/2022   Umbilical hernia without obstruction and without gangrene 02/06/2022   Chondromalacia of left patellofemoral joint 01/08/2020   Frontal headache 03/11/2019   Nodular basal cell carcinoma 11/29/2016   Mixed dyslipidemia 11/22/2016   Severe obesity with body mass index (BMI) of 35.0 to 39.9 with serious comorbidity (HCC) 11/22/2016   Sessile colonic polyp 05/14/2014   PAF (paroxysmal atrial fibrillation) (HCC) 02/24/2014   H/O inflammatory bowel disease 02/24/2014   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 Authorizing Provider   apixaban (ELIQUIS) 5 MG TABS tablet Take 1 tablet (5 mg total) by mouth 2 (two) times daily. 04/23/22 05/23/22  Rolan Bucco, MD  diltiazem (CARDIZEM CD) 120 MG 24 hr capsule Take 1 capsule (120 mg total) by mouth daily. 08/30/22 09/29/22  Gerhard Munch, MD  diltiazem (CARDIZEM) 30 MG tablet Take 1 tablet (30 mg total) by mouth daily as needed (for elevated heart rate >130). 04/26/22 05/26/22  Noralee Stain, DO  escitalopram (LEXAPRO) 20 MG tablet Take 20 mg by mouth daily.    [provider]  metoprolol succinate (TOPROL-XL) 25 MG 24 hr tablet Take 25 mg by mouth daily.    [provider]  ondansetron (ZOFRAN-ODT) 4 MG disintegrating tablet Take 1 tablet (4 mg total) by mouth every 8 (eight) hours as needed for nausea or vomiting. 05/28/23   Clark, Meghan R, PA-C  oxyCODONE-acetaminophen (PERCOCET/ROXICET) 5-325 MG tablet Take 1-2 tablets by mouth every 6 (six) hours as needed for severe pain. 05/28/23   Clark, Meghan R, PA-C  tamsulosin (FLOMAX) 0.4 MG CAPS capsule Take 1 capsule (0.4 mg total) by mouth daily. 05/28/23   Lenard Simmer, PA-C  Allergies Bee venom  Review of Systems Review of Systems As noted in HPI  Physical Exam Vital Signs  I have reviewed the triage vital signs BP (!) 125/96   Pulse (!) 52   Temp 98.2 F (36.8 C) (Oral)   Resp 11   Ht 5\' 9"  (1.753 m)   Wt 102.1 kg   SpO2 93%   BMI 33.23 kg/m   Physical Exam Vitals reviewed.  Constitutional:      General: He is not in acute distress.    Appearance: He is well-developed. He is not diaphoretic.  HENT:     Head: Normocephalic and atraumatic.     Right Ear: External ear normal.     Left Ear: External ear normal.     Nose: Nose normal.     Mouth/Throat:     Mouth: Mucous membranes are moist.  Eyes:     General: No scleral icterus.    Conjunctiva/sclera:  Conjunctivae normal.  Neck:     Trachea: Phonation normal.  Cardiovascular:     Rate and Rhythm: Tachycardia present. Rhythm irregularly irregular.  Pulmonary:     Effort: Pulmonary effort is normal. No respiratory distress.     Breath sounds: No stridor.  Abdominal:     General: There is no distension.  Musculoskeletal:        General: Normal range of motion.     Cervical back: Normal range of motion.  Neurological:     Mental Status: He is alert and oriented to person, place, and time.  Psychiatric:        Behavior: Behavior normal.     ED Results and Treatments Labs (all labs ordered are listed, but only abnormal results are displayed) Labs Reviewed  BASIC METABOLIC PANEL WITH GFR - Abnormal; Notable for the following components:      Result Value   Calcium 8.7 (*)    All other components within normal limits  CBC - Abnormal; Notable for the following components:   Platelets 147 (*)    All other components within normal limits                                                                                                                         EKG  EKG Interpretation Date/Time:  Tuesday November 19 2023 03:09:27 EDT Ventricular Rate:  117 PR Interval:    QRS Duration:  82 QT Interval:  323 QTC Calculation: 451 R Axis:   53  Text Interpretation: Atrial fibrillation Confirmed by Drema Pry [phone]) on 11/19/2023 3:35:33 AM       Radiology DG Chest Port 1 View Result Date: 11/19/2023 CLINICAL DATA:  AFib in shortness of breath. EXAM: PORTABLE CHEST 1 VIEW COMPARISON:  04/24/2022 FINDINGS: Stable cardiomediastinal silhouette. Aortic atherosclerotic calcification. No focal consolidation, pleural effusion, or pneumothorax. No displaced rib fractures. IMPRESSION: No active disease. Electronically Signed   By: Minerva Fester M.D.   On: 11/19/2023 03:32    Medications Ordered in ED Medications  diltiazem (CARDIZEM) 1 mg/mL load via infusion 15 mg (15 mg Intravenous  Bolus from Bag 11/19/23 0402)    And  diltiazem (CARDIZEM) 125 mg in dextrose 5% 125 mL (1 mg/mL) infusion (5 mg/hr Intravenous New Bag/Given 11/19/23 0402)  ondansetron (ZOFRAN) injection 4 mg (4 mg Intravenous Given 11/19/23 0407)   Procedures .Critical Care  Performed by: Nira Conn, MD Authorized by: Nira Conn, MD   Critical care provider statement:    Critical care time (minutes):  30   Critical care was necessary to treat or prevent imminent or life-threatening deterioration of the following conditions:  Cardiac failure (AFRVR)   Critical care was time spent personally by me on the following activities:  Development of treatment plan with patient or surrogate, discussions with consultants, evaluation of patient's response to treatment, examination of patient, ordering and review of laboratory studies, ordering and review of radiographic studies, ordering and performing treatments and interventions, pulse oximetry, re-evaluation of patient's condition and review of old charts   Care discussed with: admitting provider     (including critical care time) Medical Decision Making / ED Course   Medical Decision Making Amount and/or Complexity of Data Reviewed Labs: ordered. Decision-making details documented in ED Course. Radiology: ordered and independent interpretation performed. Decision-making details documented in ED Course. ECG/medicine tests: ordered and independent interpretation performed. Decision-making details documented in ED Course.  Risk Prescription drug management. Decision regarding hospitalization.    AFRVR confirmed on tele and EKG. Not on [facility] 2/2 financial constraints. Has not seen Cardiology in years. No electrolyte derangements noted. Renal function intact. No anemia. CXR neg. Rate improved with Dilt gtt. Plan to admit for further management and cardiology recs.    Final Clinical Impression(s) / ED Diagnoses Final diagnoses:  Atrial  fibrillation with RVR (HCC)     This chart was dictated using voice recognition software.  Despite best efforts to proofread,  errors can occur which can change the documentation meaning.    Nira Conn, MD 11/19/23 [id number]

## 2023-11-19 NOTE — Progress Notes (Signed)
   11/19/23 1333  Spiritual Encounters  Type of Visit Initial  Reason for visit Advance directives  OnCall Visit No   Responded to consult for AD however halfway through educational piece patient asked chaplain return to complete HCPOA as he was not feeling his best. Chaplain agreed to return at another time.

## 2023-11-19 NOTE — ED Triage Notes (Signed)
 Pt to ED from home c/o palpitations, neck and chest discomfort, and SOB since this morning.  States got up to use bathroom and symptoms started when lying back down.  Hx of afib RVR and has been cardioverted in past.

## 2023-11-19 NOTE — Progress Notes (Signed)
 PHARMACY - ANTICOAGULATION CONSULT NOTE  Pharmacy Consult for Apixaban Indication: atrial fibrillation  Allergies  Allergen Reactions   Bee Venom Hives, Shortness Of Breath, Swelling and Other (See Comments)    Severity of reactions can vary    Patient Measurements: Height: 5\' 9"  (175.3 cm) Weight: 102.1 kg (225 lb) IBW/kg (Calculated) : 70.7 HEPARIN DW (KG): 92.5  Vital Signs: Temp: 98.3 F (36.8 C) (04/01 0529) Temp Source: Oral (04/01 0529) BP: 136/56 (04/01 1600) Pulse Rate: 74 (04/01 1600)  Labs: Recent Labs    11/19/23 0313 11/19/23 0538 11/19/23 0754 11/19/23 1158  HGB 14.3  --   --   --   HCT 41.3  --   --   --   PLT 147*  --   --   --   HEPARINUNFRC  --   --   --  0.33  CREATININE 1.20  --   --   --   TROPONINIHS  --  3 3  --     Estimated Creatinine Clearance: 75.2 mL/min (by C-G formula based on SCr of 1.2 mg/dL).   Medical History: Past Medical History:  Diagnosis Date   A-fib [facility])    Anxiety    Dysrhythmia    A Fib/Paroxysmal Atrial Fibrilation   Renal disorder    kidney stones   Stevens-Johnson syndrome (HCC) 2005    Medications:  Prescribed Apixaban 08/2022 - stopped taking due to cost On IV heparin briefly today from 05:45 - 13:57  Assessment: Pharmacy consulted to dose apixaban for atrial fibrillation which he has been prescribed in the past but stopped taking due to cost.    Apixaban dosing criteria: Age < 80, Wt > 60 kg, SCr < 1.5.  Goal of Therapy:  Prevent stroke and systemic embolism Monitor platelets by anticoagulation protocol: Yes   Plan:  Apixaban 5 mg po BID With no dose adjustments anticipated, Pharmacy will sign off on consult and continue to monitor CBC as ordered by provider along with signs/symptoms of bleeding.  Thank you for allowing pharmacy to be a part of this patient's care.  Selinda Eon, PharmD, BCPS Clinical Pharmacist [facility] 11/19/2023 5:33 PM

## 2023-11-19 NOTE — H&P (Addendum)
 History and Physical    Anthony Roach ZOX:[id number] DOB: 07-05-1961 DOA: 11/19/2023  Patient coming from: Home.  Chief Complaint: Palpitations.  HPI: Anthony Roach is a 63 y.o. male with history of intermittent atrial fibrillation since age 34 presents to the ER with palpitations.  Patient states he woke up this morning to go to the bathroom following which he started having palpitations.  Denies chest pain shortness of breath.  Has been taking Cardizem and metoprolol.  Stopped taking Eliquis due to cost.  Last time cardioverted on 04/26/2022.Has been having nausea since morning but denies any abdominal pain.  ED Course: Patient was in A-fib with RVR in the ER and started on Cardizem infusion.  Labs were unremarkable.  TSH is pending.  Patient admitted for further observation.  Review of Systems: As per HPI, rest all negative.   Past Medical History:  Diagnosis Date   A-fib [facility])    Anxiety    Dysrhythmia    A Fib/Paroxysmal Atrial Fibrilation   Renal disorder    kidney stones   Stevens-Johnson syndrome (HCC) 2005    Past Surgical History:  Procedure Laterality Date   CARDIOVERSION N/A 04/26/2022   Procedure: CARDIOVERSION;  Surgeon: Little Ishikawa, MD;  Location: [facility] ENDOSCOPY;  Service: Cardiovascular;  Laterality: N/A;   Cysto, stents     CYSTOSCOPY W/ URETERAL STENT PLACEMENT Bilateral 03/06/2016   Procedure: CYSTOSCOPY WITH BILATERAL RETROGRADE PYELOGRAM/BILATERAL URETEROSCOPY AND BILATERAL LASER APPLICATION, BILATERAL URETERAL STENT PLACEMENT;  Surgeon: Sebastian Ache, MD;  Location: [facility] ORS;  Service: Urology;  Laterality: Bilateral;   CYSTOSCOPY WITH RETROGRADE PYELOGRAM, URETEROSCOPY AND STENT PLACEMENT Right 05/30/2023   Procedure: CYSTOSCOPY WITH RETROGRADE PYELOGRAM, URETEROSCOPY AND STENT PLACEMENT,HOLIUM LASER LITHOTRIPSY;  Surgeon: Heloise Purpura, MD;  Location: [facility] ORS;  Service: Urology;  Laterality: Right;   Rotator Cuff Repair Left Shoulder     TEE  WITHOUT CARDIOVERSION N/A 04/26/2022   Procedure: TRANSESOPHAGEAL ECHOCARDIOGRAM (TEE);  Surgeon: Little Ishikawa, MD;  Location: [facility] ENDOSCOPY;  Service: Cardiovascular;  Laterality: N/A;     reports that he has quit smoking. He has never used smokeless tobacco. He reports current alcohol use. He reports that he does not use drugs.  Allergies  Allergen Reactions   Bee Venom Hives, Shortness Of Breath, Swelling and Other (See Comments)    Severity of reactions can vary    History reviewed. No pertinent family history.  Prior to Admission medications   Medication Sig Start Date End Date Taking? Authorizing Provider  diltiazem (CARDIZEM CD) 120 MG 24 hr capsule Take 120 mg by mouth daily.   Yes [provider]  escitalopram (LEXAPRO) 20 MG tablet Take 20 mg by mouth daily.   Yes [provider]  metoprolol succinate (TOPROL-XL) 25 MG 24 hr tablet Take 25 mg by mouth daily.   Yes [provider]  POTASSIUM PO Take 1 tablet by mouth daily as needed (excessive sweating).   Yes [provider]  apixaban (ELIQUIS) 5 MG TABS tablet Take 1 tablet (5 mg total) by mouth 2 (two) times daily. 04/23/22 05/23/22  Rolan Bucco, MD  diltiazem (CARDIZEM CD) 120 MG 24 hr capsule Take 1 capsule (120 mg total) by mouth daily. 08/30/22 09/29/22  Gerhard Munch, MD  diltiazem (CARDIZEM) 30 MG tablet Take 1 tablet (30 mg total) by mouth daily as needed (for elevated heart rate >130). 04/26/22 05/26/22  Noralee Stain, DO  ondansetron (ZOFRAN-ODT) 4 MG disintegrating tablet Take 1 tablet (4 mg total) by mouth every  8 (eight) hours as needed for nausea or vomiting. Patient not taking: Reported on 11/19/2023 05/28/23   Melton Alar R, PA-C  oxyCODONE-acetaminophen (PERCOCET/ROXICET) 5-325 MG tablet Take 1-2 tablets by mouth every 6 (six) hours as needed for severe pain. Patient not taking: Reported on 11/19/2023 05/28/23   Melton Alar R, PA-C  tamsulosin (FLOMAX) 0.4 MG CAPS capsule  Take 1 capsule (0.4 mg total) by mouth daily. Patient not taking: Reported on 11/19/2023 05/28/23   Lenard Simmer, PA-C    Physical Exam: Constitutional: Moderately built and nourished. Vitals:   11/19/23 0445 11/19/23 0500 11/19/23 0529 11/19/23 0529  BP:  107/78    Pulse: 66 (!) 110 77   Resp: 15 12 11    Temp:    98.3 F (36.8 C)  TempSrc:    Oral  SpO2: 92% 96% 95%   Weight:      Height:       Eyes: Anicteric no pallor. ENMT: No discharge from the ears eyes nose or mouth. Neck: No mass felt.  No neck rigidity. Respiratory: No rhonchi or crepitations. Cardiovascular: S1-S2 heard. Abdomen: Soft nontender bowel sounds present. Musculoskeletal: No edema. Skin: No rash. Neurologic: Alert awake oriented to time place and person.  Moves all extremities. Psychiatric: Appears normal.  Normal affect.   Labs on Admission: I have personally reviewed following labs and imaging studies  CBC: Recent Labs  Lab 11/19/23 0313  WBC 5.0  HGB 14.3  HCT 41.3  MCV 94.1  PLT 147*   Basic Metabolic Panel: Recent Labs  Lab 11/19/23 0313  NA 137  K 3.8  CL 106  CO2 23  GLUCOSE 93  BUN 18  CREATININE 1.20  CALCIUM 8.7*   GFR: Estimated Creatinine Clearance: 75.2 mL/min (by C-G formula based on SCr of 1.2 mg/dL). Liver Function Tests: No results for input(s): "AST", "ALT", "ALKPHOS", "BILITOT", "PROT", "ALBUMIN" in the last 168 hours. No results for input(s): "LIPASE", "AMYLASE" in the last 168 hours. No results for input(s): "AMMONIA" in the last 168 hours. Coagulation Profile: No results for input(s): "INR", "PROTIME" in the last 168 hours. Cardiac Enzymes: No results for input(s): "CKTOTAL", "CKMB", "CKMBINDEX", "TROPONINI" in the last 168 hours. BNP (last 3 results) No results for input(s): "PROBNP" in the last 8760 hours. HbA1C: No results for input(s): "HGBA1C" in the last 72 hours. CBG: No results for input(s): "GLUCAP" in the last 168 hours. Lipid Profile: No  results for input(s): "CHOL", "HDL", "LDLCALC", "TRIG", "CHOLHDL", "LDLDIRECT" in the last 72 hours. Thyroid Function Tests: No results for input(s): "TSH", "T4TOTAL", "FREET4", "T3FREE", "THYROIDAB" in the last 72 hours. Anemia Panel: No results for input(s): "VITAMINB12", "FOLATE", "FERRITIN", "TIBC", "IRON", "RETICCTPCT" in the last 72 hours. Urine analysis:    Component Value Date/Time   COLORURINE YELLOW 05/28/2023 0827   APPEARANCEUR CLEAR 05/28/2023 0827   LABSPEC 1.017 05/28/2023 0827   PHURINE 5.0 05/28/2023 0827   GLUCOSEU NEGATIVE 05/28/2023 0827   HGBUR LARGE (A) 05/28/2023 0827   BILIRUBINUR NEGATIVE 05/28/2023 0827   KETONESUR NEGATIVE 05/28/2023 0827   PROTEINUR NEGATIVE 05/28/2023 0827   UROBILINOGEN 0.2 11/16/2007 1556   NITRITE NEGATIVE 05/28/2023 0827   LEUKOCYTESUR NEGATIVE 05/28/2023 0827   Sepsis Labs: @LABRCNTIP (procalcitonin:4,lacticidven:4) )No results found for this or any previous visit (from the past 240 hours).   Radiological Exams on Admission: DG Chest Port 1 View Result Date: 11/19/2023 CLINICAL DATA:  AFib in shortness of breath. EXAM: PORTABLE CHEST 1 VIEW COMPARISON:  04/24/2022 FINDINGS: Stable cardiomediastinal silhouette. Aortic  atherosclerotic calcification. No focal consolidation, pleural effusion, or pneumothorax. No displaced rib fractures. IMPRESSION: No active disease. Electronically Signed   By: Minerva Fester M.D.   On: 11/19/2023 03:32    EKG: Independently reviewed.  With RVR.  Assessment/Plan Principal Problem:   Atrial fibrillation with RVR (HCC) Active Problems:   Anxiety   Atrial fibrillation with rapid ventricular response (HCC)    A-fib with RVR presently on Cardizem infusion.  Patient has been started on heparin infusion.  CHADS2 Vasc 0.  I have consulted cardiology.  TSH and troponins and 2D echo are pending.  Will continue patient's home dose of Cardizem CD and Toprol-XL. Anxiety on Lexapro. Nausea - patient has been  having nausea since waking up. Denies any abdominal pain. Abdomen appears benign on exam. Will check LFT''s and Lipase.  Since patient has A-fib with RVR will need close monitoring and more than 2 midnight stay.   DVT prophylaxis: Heparin infusion. Code Status: Full code. Family Communication: Family at the bedside. Disposition Plan: Stepdown. Consults called: Cardiology. Admission status: Observation.

## 2023-11-19 NOTE — Progress Notes (Addendum)
 Patient added for TEE/DCCV on Thursday, will need consent and orders, no official consult needed, per Dr Jacques Navy request.

## 2023-11-19 NOTE — Progress Notes (Signed)
  Echocardiogram 2D Echocardiogram has been performed.  Ocie Doyne RDCS 11/19/2023, 10:57 AM

## 2023-11-19 NOTE — Progress Notes (Signed)
 A fib clinic arranged per Dr Jacques Navy request, no official consult needed from cardiology at this time (MD had communicated with hospitalist), please call back if any questions.

## 2023-11-20 ENCOUNTER — Telehealth (HOSPITAL_COMMUNITY): Payer: Self-pay | Admitting: Pharmacy Technician

## 2023-11-20 ENCOUNTER — Other Ambulatory Visit (HOSPITAL_COMMUNITY): Payer: Self-pay

## 2023-11-20 DIAGNOSIS — I480 Paroxysmal atrial fibrillation: Principal | ICD-10-CM

## 2023-11-20 DIAGNOSIS — I4891 Unspecified atrial fibrillation: Secondary | ICD-10-CM | POA: Diagnosis not present

## 2023-11-20 LAB — CBC
HCT: 46 % (ref 39.0–52.0)
Hemoglobin: 15.1 g/dL (ref 13.0–17.0)
MCH: 31.8 pg (ref 26.0–34.0)
MCHC: 32.8 g/dL (ref 30.0–36.0)
MCV: 96.8 fL (ref 80.0–100.0)
Platelets: 169 10*3/uL (ref 150–400)
RBC: 4.75 MIL/uL (ref 4.22–5.81)
RDW: 12.1 % (ref 11.5–15.5)
WBC: 7.2 10*3/uL (ref 4.0–10.5)
nRBC: 0 % (ref 0.0–0.2)

## 2023-11-20 MED ORDER — METOPROLOL SUCCINATE ER 25 MG PO TB24
25.0000 mg | ORAL_TABLET | Freq: Once | ORAL | Status: AC
  Administered 2023-11-20: 25 mg via ORAL
  Filled 2023-11-20: qty 1

## 2023-11-20 MED ORDER — SENNOSIDES-DOCUSATE SODIUM 8.6-50 MG PO TABS
1.0000 | ORAL_TABLET | Freq: Two times a day (BID) | ORAL | Status: DC
  Administered 2023-11-20: 1 via ORAL
  Filled 2023-11-20 (×2): qty 1

## 2023-11-20 MED ORDER — BISACODYL 10 MG RE SUPP: 10.0000 mg | Freq: Every day | RECTAL | Status: DC | PRN

## 2023-11-20 MED ORDER — OXYCODONE HCL 5 MG/5ML PO SOLN: 5.0000 mg | Freq: Once | ORAL | Status: DC

## 2023-11-20 MED ORDER — POLYETHYLENE GLYCOL 3350 17 G PO PACK
17.0000 g | PACK | Freq: Every day | ORAL | Status: DC | PRN
  Administered 2023-11-20: 17 g via ORAL
  Filled 2023-11-20: qty 1

## 2023-11-20 MED ORDER — DOCUSATE SODIUM 50 MG PO CAPS
50.0000 mg | ORAL_CAPSULE | Freq: Once | ORAL | Status: AC
  Administered 2023-11-20: 50 mg via ORAL
  Filled 2023-11-20: qty 1

## 2023-11-20 MED ORDER — METOPROLOL TARTRATE 5 MG/5ML IV SOLN
2.5000 mg | Freq: Once | INTRAVENOUS | Status: AC | PRN
Start: 2023-11-20 — End: 2023-11-20
  Administered 2023-11-20: 2.5 mg via INTRAVENOUS
  Filled 2023-11-20: qty 5

## 2023-11-20 MED ORDER — APIXABAN 5 MG PO TABS: 5.0000 mg | ORAL_TABLET | Freq: Two times a day (BID) | ORAL | 0 refills | Status: DC

## 2023-11-20 MED ORDER — DIPHENHYDRAMINE HCL 25 MG PO CAPS
25.0000 mg | ORAL_CAPSULE | Freq: Every evening | ORAL | Status: DC | PRN
  Administered 2023-11-20: 25 mg via ORAL
  Filled 2023-11-20: qty 1

## 2023-11-20 MED ORDER — OXYCODONE HCL 5 MG PO TABS
5.0000 mg | ORAL_TABLET | Freq: Once | ORAL | Status: AC | PRN
  Administered 2023-11-20: 5 mg via ORAL
  Filled 2023-11-20: qty 1

## 2023-11-20 MED ORDER — METOPROLOL SUCCINATE ER 25 MG PO TB24
50.0000 mg | ORAL_TABLET | Freq: Every day | ORAL | Status: DC
Start: 2023-11-21 — End: 2023-11-21
  Filled 2023-11-20: qty 2

## 2023-11-20 NOTE — Progress Notes (Signed)
   11/20/23 1651  Spiritual Encounters  Type of Visit Follow up  Care provided to: Pt and family  Referral source Patient request  Reason for visit Advance directives  OnCall Visit No   Follow up visit with patient and spouse. Provided AD education. Patient states that his spouse would be his agent automatic and his oldest daughter next. They will review paperwork and advise if they want to pursue.

## 2023-11-20 NOTE — Plan of Care (Signed)
 ?  Problem: Education: ?Goal: Knowledge of General Education information will improve ?Description: Including pain rating scale, medication(s)/side effects and non-pharmacologic comfort measures ?Outcome: Progressing ?  ?Problem: Health Behavior/Discharge Planning: ?Goal: Ability to manage health-related needs will improve ?Outcome: Progressing ?  ?Problem: Clinical Measurements: ?Goal: Ability to maintain clinical measurements within normal limits will improve ?Outcome: Progressing ?Goal: Diagnostic test results will improve ?Outcome: Progressing ?  ?Problem: Activity: ?Goal: Risk for activity intolerance will decrease ?Outcome: Progressing ?  ?Problem: Nutrition: ?Goal: Adequate nutrition will be maintained ?Outcome: Progressing ?  ?

## 2023-11-20 NOTE — Telephone Encounter (Signed)
 Patient Product/process development scientist completed.    The patient is insured through [employer]. Patient has [employer], may use a copay card, and/or apply for patient assistance if available.    Ran test claim for Eliquis 5 mg and the current 30 day co-pay is $30.00.   This test claim was processed through [facility]- copay amounts may vary at other pharmacies due to pharmacy/plan contracts, or as the patient moves through the different stages of their insurance plan.     Roland Earl, CPHT Pharmacy Technician III Certified Patient Advocate [facility] Patient Advocate Team Direct Number: [phone]  Fax: [phone]

## 2023-11-20 NOTE — Plan of Care (Signed)
  Problem: Education: Goal: Knowledge of General Education information will improve Description: Including pain rating scale, medication(s)/side effects and non-pharmacologic comfort measures Outcome: Progressing   Problem: Health Behavior/Discharge Planning: Goal: Ability to manage health-related needs will improve Outcome: Progressing   Problem: Clinical Measurements: Goal: Ability to maintain clinical measurements within normal limits will improve Outcome: Progressing Goal: Will remain free from infection Outcome: Progressing Goal: Diagnostic test results will improve Outcome: Progressing Goal: Respiratory complications will improve Outcome: Progressing Goal: Cardiovascular complication will be avoided Outcome: Progressing   Problem: Activity: Goal: Risk for activity intolerance will decrease Outcome: Progressing   Problem: Nutrition: Goal: Adequate nutrition will be maintained Outcome: Progressing   Problem: Coping: Goal: Level of anxiety will decrease Outcome: Progressing   Problem: Elimination: Goal: Will not experience complications related to bowel motility Outcome: Progressing Goal: Will not experience complications related to urinary retention Outcome: Progressing   Problem: Pain Managment: Goal: General experience of comfort will improve and/or be controlled Outcome: Progressing   Problem: Safety: Goal: Ability to remain free from injury will improve Outcome: Progressing   Problem: Skin Integrity: Goal: Risk for impaired skin integrity will decrease Outcome: Progressing   Problem: Education: Goal: Knowledge of disease or condition will improve Outcome: Progressing Goal: Understanding of medication regimen will improve Outcome: Progressing   Problem: Activity: Goal: Ability to tolerate increased activity will improve Outcome: Progressing   Problem: Cardiac: Goal: Ability to achieve and maintain adequate cardiopulmonary perfusion will  improve Outcome: Progressing   Problem: Health Behavior/Discharge Planning: Goal: Ability to safely manage health-related needs after discharge will improve Outcome: Progressing   Cindy S. Clelia Croft BSN, RN, [employer], CCRN 11/20/2023 9:43 PM

## 2023-11-20 NOTE — Progress Notes (Signed)
 PROGRESS NOTE    Anthony Roach  JXB:[id number] DOB: Jan 06, 1961 DOA: 11/19/2023 PCP: Anthony Harries, MD   Brief Narrative:  63 year old male with history of paroxysmal A-fib and anxiety presented with palpitations and was found to be in A-fib with RVR.  He was started on Cardizem and heparin drips.  Assessment & Plan:   Paroxysmal A-fib with RVR -Started on Cardizem and heparin drips.  Subsequently, switched to Eliquis and metoprolol succinate along with oral diltiazem after discussion with cardiology.  Still has intermittent tachycardia with rates reaching up to the 150s earlier this morning requiring IV metoprolol. -Cardiology planning for TEE DCCV possibly tomorrow.  Follow cardiology recommendations.  Anxiety -Continue Lexapro  Thrombocytopenia -Resolved  Obesity class I -Outpatient follow-up  DVT prophylaxis: Eliquis Code Status: Full Family Communication: None at bedside Disposition Plan: Status is: Observation The patient will require care spanning > 2 midnights and should be moved to inpatient because: Of severity of illness.  Need for further cardiac care  Consultants: Cardiology  Procedures: None  Antimicrobials: None   Subjective: Patient seen and examined at bedside.  Had palpitations earlier this morning.  No fever, shortness of breath, vomiting reported.  Objective: Vitals:   11/20/23 0614 11/20/23 0615 11/20/23 0619 11/20/23 0630  BP:   110/84   Pulse: 90 78  76  Resp: 15 17  13   Temp:      TempSrc:      SpO2: 98% 99%  97%  Weight:      Height:        Intake/Output Summary (Last 24 hours) at 11/20/2023 1308 Last data filed at 11/20/2023 0000 Gross per 24 hour  Intake 170.29 ml  Output 400 ml  Net -229.71 ml   Filed Weights   11/19/23 0305  Weight: 102.1 kg    Examination:  General exam: Appears calm and comfortable  Respiratory system: Bilateral decreased breath sounds at bases Cardiovascular system: S1 & S2 heard, intermittently  tachycardic  gastrointestinal system: Abdomen is obese, nondistended, soft and nontender. Normal bowel sounds heard. Extremities: No cyanosis, clubbing, edema  Central nervous system: Alert and oriented. No focal neurological deficits. Moving extremities Skin: No rashes, lesions or ulcers Psychiatry: Judgement and insight appear normal. Mood & affect appropriate.     Data Reviewed: I have personally reviewed following labs and imaging studies  CBC: Recent Labs  Lab 11/19/23 0313 11/20/23 0301  WBC 5.0 7.2  HGB 14.3 15.1  HCT 41.3 46.0  MCV 94.1 96.8  PLT 147* 169   Basic Metabolic Panel: Recent Labs  Lab 11/19/23 0313  NA 137  K 3.8  CL 106  CO2 23  GLUCOSE 93  BUN 18  CREATININE 1.20  CALCIUM 8.7*   GFR: Estimated Creatinine Clearance: 75.2 mL/min (by C-G formula based on SCr of 1.2 mg/dL). Liver Function Tests: Recent Labs  Lab 11/19/23 0754  AST 21  ALT 22  ALKPHOS 50  BILITOT 1.0  PROT 6.2*  ALBUMIN 3.6   Recent Labs  Lab 11/19/23 0754  LIPASE 40   No results for input(s): "AMMONIA" in the last 168 hours. Coagulation Profile: No results for input(s): "INR", "PROTIME" in the last 168 hours. Cardiac Enzymes: No results for input(s): "CKTOTAL", "CKMB", "CKMBINDEX", "TROPONINI" in the last 168 hours. BNP (last 3 results) No results for input(s): "PROBNP" in the last 8760 hours. HbA1C: No results for input(s): "HGBA1C" in the last 72 hours. CBG: No results for input(s): "GLUCAP" in the last 168 hours. Lipid Profile: No results  for input(s): "CHOL", "HDL", "LDLCALC", "TRIG", "CHOLHDL", "LDLDIRECT" in the last 72 hours. Thyroid Function Tests: Recent Labs    11/19/23 0538  TSH 1.708   Anemia Panel: No results for input(s): "VITAMINB12", "FOLATE", "FERRITIN", "TIBC", "IRON", "RETICCTPCT" in the last 72 hours. Sepsis Labs: No results for input(s): "PROCALCITON", "LATICACIDVEN" in the last 168 hours.  Recent Results (from the past 240 hours)   MRSA Next Gen by PCR, Nasal     Status: None   Collection Time: 11/19/23  7:48 AM   Specimen: Nasal Mucosa; Nasal Swab  Result Value Ref Range Status   MRSA by PCR Next Gen NOT DETECTED NOT DETECTED Final    Comment: (NOTE) The GeneXpert MRSA Assay (FDA approved for NASAL specimens only), is one component of a comprehensive MRSA colonization surveillance program. It is not intended to diagnose MRSA infection nor to guide or monitor treatment for MRSA infections. Test performance is not FDA approved in patients less than 65 years old. Performed at [facility], [address] [place], [place] [address]          Radiology Studies: ECHOCARDIOGRAM COMPLETE Result Date: 11/19/2023    ECHOCARDIOGRAM REPORT   Patient Name:   Anthony Roach Date of Exam: 11/19/2023 Medical Rec #:  [id number]           Height:       69.0 in Accession #:    [id number]          Weight:       225.0 lb Date of Birth:  [birth year]            BSA:          2.172 m Patient Age:    63 years            BP:           113/75 mmHg Patient Gender: M                   HR:           89 bpm. Exam Location:  Inpatient Procedure: 2D Echo, Cardiac Doppler and Color Doppler (Both Spectral and Color            Flow Doppler were utilized during procedure). Indications:    Atrial fibrillation  History:        Patient has prior history of Echocardiogram examinations, most                 recent 04/25/2022. Signs/Symptoms:Chest Pain; Risk                 Factors:Dyslipidemia.  Sonographer:    Vern Claude Referring Phys: [id number] Anthony Roach IMPRESSIONS  1. Left ventricular ejection fraction, by estimation, is 60 to 65%. The left ventricle has normal function. The left ventricle has no regional wall motion abnormalities. Left ventricular diastolic function could not be evaluated.  2. Right ventricular systolic function is normal. The right ventricular size is normal. Tricuspid regurgitation signal is inadequate for  assessing PA pressure.  3. The mitral valve is normal in structure. No evidence of mitral valve regurgitation. No evidence of mitral stenosis.  4. The aortic valve is tricuspid. Aortic valve regurgitation is not visualized. No aortic stenosis is present.  5. The inferior vena cava is normal in size with greater than 50% respiratory variability, suggesting right atrial pressure of 3 mmHg. FINDINGS  Left Ventricle: Left ventricular ejection fraction, by estimation, is 60 to 65%. The left  ventricle has normal function. The left ventricle has no regional wall motion abnormalities. The left ventricular internal cavity size was normal in size. There is  no left ventricular hypertrophy. Left ventricular diastolic function could not be evaluated due to atrial fibrillation. Left ventricular diastolic function could not be evaluated. Right Ventricle: The right ventricular size is normal. No increase in right ventricular wall thickness. Right ventricular systolic function is normal. Tricuspid regurgitation signal is inadequate for assessing PA pressure. Left Atrium: Left atrial size was normal in size. Right Atrium: Right atrial size was normal in size. Pericardium: There is no evidence of pericardial effusion. Mitral Valve: The mitral valve is normal in structure. No evidence of mitral valve regurgitation. No evidence of mitral valve stenosis. MV peak gradient, 1.8 mmHg. The mean mitral valve gradient is 1.0 mmHg. Tricuspid Valve: The tricuspid valve is normal in structure. Tricuspid valve regurgitation is trivial. No evidence of tricuspid stenosis. Aortic Valve: The aortic valve is tricuspid. Aortic valve regurgitation is not visualized. No aortic stenosis is present. Aortic valve mean gradient measures 2.0 mmHg. Aortic valve peak gradient measures 4.0 mmHg. Aortic valve area, by VTI measures 3.10 cm. Pulmonic Valve: The pulmonic valve was normal in structure. Pulmonic valve regurgitation is trivial. No evidence of pulmonic  stenosis. Aorta: The aortic root is normal in size and structure. Venous: The inferior vena cava is normal in size with greater than 50% respiratory variability, suggesting right atrial pressure of 3 mmHg. IAS/Shunts: No atrial level shunt detected by color flow Doppler.  LEFT VENTRICLE PLAX 2D LVIDd:         4.30 cm LVIDs:         2.90 cm LV PW:         0.80 cm LV IVS:        0.70 cm LVOT diam:     1.90 cm LV SV:         52 LV SV Index:   24 LVOT Area:     2.84 cm  LV Volumes (MOD) LV vol d, MOD A2C: 126.0 ml LV vol d, MOD A4C: 127.0 ml LV vol s, MOD A2C: 39.0 ml LV vol s, MOD A4C: 40.4 ml LV SV MOD A2C:     87.0 ml LV SV MOD A4C:     127.0 ml LV SV MOD BP:      86.7 ml RIGHT VENTRICLE             IVC RV Basal diam:  2.70 cm     IVC diam: 1.80 cm RV Mid diam:    1.70 cm RV S prime:     12.20 cm/s TAPSE (M-mode): 2.4 cm LEFT ATRIUM             Index        RIGHT ATRIUM          Index LA diam:        3.70 cm 1.70 cm/m   RA Area:     8.72 cm LA Vol (A2C):   28.8 ml 13.26 ml/m  RA Volume:   14.90 ml 6.86 ml/m LA Vol (A4C):   32.3 ml 14.87 ml/m LA Biplane Vol: 32.0 ml 14.73 ml/m  AORTIC VALVE                    PULMONIC VALVE AV Area (Vmax):    3.05 cm     PV Vmax:       0.71 m/s AV Area (Vmean):   2.89  cm     PV Peak grad:  2.0 mmHg AV Area (VTI):     3.10 cm AV Vmax:           99.70 cm/s AV Vmean:          64.700 cm/s AV VTI:            0.167 m AV Peak Grad:      4.0 mmHg AV Mean Grad:      2.0 mmHg LVOT Vmax:         107.27 cm/s LVOT Vmean:        66.000 cm/s LVOT VTI:          0.183 m LVOT/AV VTI ratio: 1.09  AORTA Ao Root diam: 4.00 cm Ao Asc diam:  3.70 cm MITRAL VALVE MV Area (PHT): 5.52 cm    SHUNTS MV Area VTI:   4.81 cm    Systemic VTI:  0.18 m MV Peak grad:  1.8 mmHg    Systemic Diam: 1.90 cm MV Mean grad:  1.0 mmHg MV Vmax:       0.67 m/s MV Vmean:      37.5 cm/s MV Decel Time: 138 msec MV E velocity: 77.75 cm/s Clearnce Hasten Electronically signed by Clearnce Hasten Signature Date/Time:  11/19/2023/11:00:58 AM    Final    DG Chest Port 1 View Result Date: 11/19/2023 CLINICAL DATA:  AFib in shortness of breath. EXAM: PORTABLE CHEST 1 VIEW COMPARISON:  04/24/2022 FINDINGS: Stable cardiomediastinal silhouette. Aortic atherosclerotic calcification. No focal consolidation, pleural effusion, or pneumothorax. No displaced rib fractures. IMPRESSION: No active disease. Electronically Signed   By: Minerva Fester M.D.   On: 11/19/2023 03:32        Scheduled Meds:  apixaban  5 mg Oral BID   Chlorhexidine Gluconate Cloth  6 each Topical Daily   diltiazem  120 mg Oral Daily   escitalopram  20 mg Oral Daily   metoprolol succinate  25 mg Oral Daily   Continuous Infusions:        Glade Lloyd, MD [facility] 11/20/2023, 6:42 AM

## 2023-11-20 NOTE — Discharge Instructions (Signed)

## 2023-11-20 NOTE — Plan of Care (Signed)

## 2023-11-20 NOTE — Progress Notes (Signed)
       Overnight   NAME: KORBIN MAPPS MRN: [id number] DOB : 19-Sep-1960    Date of Service   11/20/2023   HPI/Events of Note    Notified by RN for tachycardia of around 150+....(verified at bedside) Pain relief and O2 administration were only minimally effective  Lopressor given at equivalent IV dose with success. BP improved, HR improved Pt is expressing relief overall.    Interventions/ Plan   Maintain O2 for now Continue all previous Attending orders       Chinita Greenland BSN MSNA MSN ACNPC-AG Acute Care Nurse Practitioner [facility]

## 2023-11-21 ENCOUNTER — Encounter (HOSPITAL_COMMUNITY): Payer: Self-pay | Admitting: *Deleted

## 2023-11-21 ENCOUNTER — Other Ambulatory Visit (HOSPITAL_COMMUNITY)

## 2023-11-21 DIAGNOSIS — I480 Paroxysmal atrial fibrillation: Secondary | ICD-10-CM | POA: Diagnosis not present

## 2023-11-21 DIAGNOSIS — Z87891 Personal history of nicotine dependence: Secondary | ICD-10-CM | POA: Diagnosis not present

## 2023-11-21 DIAGNOSIS — I4891 Unspecified atrial fibrillation: Secondary | ICD-10-CM | POA: Diagnosis not present

## 2023-11-21 DIAGNOSIS — Z79899 Other long term (current) drug therapy: Secondary | ICD-10-CM | POA: Diagnosis not present

## 2023-11-21 DIAGNOSIS — Z7901 Long term (current) use of anticoagulants: Secondary | ICD-10-CM | POA: Diagnosis not present

## 2023-11-21 LAB — BASIC METABOLIC PANEL WITH GFR
Anion gap: 8 (ref 5–15)
BUN: 17 mg/dL (ref 8–23)
CO2: 23 mmol/L (ref 22–32)
Calcium: 8.5 mg/dL — ABNORMAL LOW (ref 8.9–10.3)
Chloride: 101 mmol/L (ref 98–111)
Creatinine, Ser: 1.04 mg/dL (ref 0.61–1.24)
GFR, Estimated: >60 mL/min (ref >60)
Glucose, Bld: 93 mg/dL (ref 70–99)
Potassium: 4.1 mmol/L (ref 3.5–5.1)
Sodium: 132 mmol/L — ABNORMAL LOW (ref 135–145)

## 2023-11-21 LAB — MAGNESIUM: Magnesium: 2 mg/dL (ref 1.7–2.4)

## 2023-11-21 SURGERY — TRANSESOPHAGEAL ECHOCARDIOGRAM (TEE) (CATHLAB)
Anesthesia: Monitor Anesthesia Care

## 2023-11-21 MED ORDER — DILTIAZEM HCL ER COATED BEADS 120 MG PO CP24: 120.0000 mg | ORAL_CAPSULE | Freq: Every day | ORAL | 0 refills | Status: DC

## 2023-11-21 NOTE — Progress Notes (Addendum)
   Patient converted to sinus rhythm last night. Rates currently in the 50s to 60s which seems to be around his baseline when in sinus per patient's report. TEE/ DCCV for today has been cancelled. He is okay for discharge from a cardiac standpoint. His CHA2DS2-VASc = 0; therefore, he does not technically need long-term anticoagulation per guidelines. Discussed this with patient and he would preferred not to be on anticoagulation. Therefore, okay to stop Eliquis. Recommend continuing home Diltiazem CD 120mg  and Toprol-XL 25mg  daily.  Will refer to EP as an outpatient to discuss possible ablation. Discussed this with Dr. Jacques Navy who agrees with above plan. Notified primary team.  Cardiology will sign off.  Anthony Parker, PA-C 11/21/2023 9:17 AM

## 2023-11-21 NOTE — Discharge Summary (Signed)
 Physician Discharge Summary  Anthony Roach:[id number] DOB: [birth year] DOA: 11/19/2023  PCP: Anthony Harries, MD  Admit date: 11/19/2023 Discharge date: 11/21/2023  Admitted From: Home Disposition: Home  Recommendations for Outpatient Follow-up:  Follow up with PCP in 1 week with repeat CBC/BMP Outpatient follow-up with cardiology/electrophysiology Follow up in ED if symptoms worsen or new appear   Home Health: No Equipment/Devices: None  Discharge Condition: Stable CODE STATUS: Full Diet recommendation: Heart healthy  Brief/Interim Summary: 63 year old male with history of paroxysmal A-fib and anxiety presented with palpitations and was found to be in A-fib with RVR. He was started on Cardizem and heparin drips.  During the hospitalization, his condition has improved.  He has converted to normal sinus rhythm.  He is off Cardizem and heparin drips.  He was initially started on oral Eliquis and cardiology was planning to do TEE DCCV today but since he has converted to normal sinus rhythm, cardiology is recommending to DC Eliquis and has canceled the procedure.  Cardiology has cleared the patient for discharge on home doses of oral Toprol XL and Cardizem.  Discharge patient home today with outpatient follow-up with PCP and cardiology/electrophysiology.  Discharge Diagnoses:   Paroxysmal A-fib with RVR -Started on Cardizem and heparin drips.  Subsequently, switched to Eliquis and metoprolol succinate along with oral diltiazem.  -cardiology was planning to do TEE DCCV today but since he has converted to normal sinus rhythm, cardiology is recommending to DC Eliquis and has canceled the procedure.  Cardiology has cleared the patient for discharge on home doses of oral Toprol XL and Cardizem.  Discharge patient home today with outpatient follow-up with PCP and cardiology/electrophysiology.   Anxiety -Continue Lexapro   Thrombocytopenia -Resolved   Obesity class I -Outpatient  follow-up  Nephrolithiasis -Patient passed few kidney stones during this hospitalization.  Recommend outpatient evaluation and follow-up by urology.  Discharge Instructions  Discharge Instructions     Ambulatory referral to Cardiac Electrophysiology   Complete by: As directed    Consideration of atrial fibrillation ablation   Diet - low sodium heart healthy   Complete by: As directed    Increase activity slowly   Complete by: As directed       Allergies as of 11/21/2023       Reactions   Bee Venom Hives, Shortness Of Breath, Swelling, Other (See Comments)   Severity of reactions can vary        Medication List     STOP taking these medications    apixaban 5 MG Tabs tablet Commonly known as: ELIQUIS   diltiazem 30 MG tablet Commonly known as: Cardizem   ondansetron 4 MG disintegrating tablet Commonly known as: ZOFRAN-ODT   oxyCODONE-acetaminophen 5-325 MG tablet Commonly known as: PERCOCET/ROXICET   tamsulosin 0.4 MG Caps capsule Commonly known as: FLOMAX       TAKE these medications    diltiazem 120 MG 24 hr capsule Commonly known as: CARDIZEM CD Take 1 capsule (120 mg total) by mouth daily. Start taking on: November 22, 2023 What changed: Another medication with the same name was removed. Continue taking this medication, and follow the directions you see here.   escitalopram 20 MG tablet Commonly known as: LEXAPRO Take 20 mg by mouth daily.   metoprolol succinate 25 MG 24 hr tablet Commonly known as: TOPROL-XL Take 25 mg by mouth daily.   POTASSIUM PO Take 1 tablet by mouth daily as needed (excessive sweating).        Follow-up Information  [facility] Follow up.   Specialty: Cardiology Why: Referral to our Electrophysiology team has been placed. Our office will call you to schedule an appointment. Contact information: [address] [place] [address] [phone]         Anthony Harries, MD. Schedule an appointment as soon as possible for a visit in 1 week(s).   Specialty: Family Medicine Contact information: [address] [place] [address] [phone]                Allergies  Allergen Reactions   Bee Venom Hives, Shortness Of Breath, Swelling and Other (See Comments)    Severity of reactions can vary    Consultations: Cardiology   Procedures/Studies: ECHOCARDIOGRAM COMPLETE Result Date: 11/19/2023    ECHOCARDIOGRAM REPORT   Patient Name:   Anthony Roach Date of Exam: 11/19/2023 Medical Rec #:  [id number]           Height:       69.0 in Accession #:    [id number]          Weight:       225.0 lb Date of Birth:  1960/10/17            BSA:          2.172 m Patient Age:    63 years            BP:           113/75 mmHg Patient Gender: M                   HR:           89 bpm. Exam Location:  Inpatient Procedure: 2D Echo, Cardiac Doppler and Color Doppler (Both Spectral and Color            Flow Doppler were utilized during procedure). Indications:    Atrial fibrillation  History:        Patient has prior history of Echocardiogram examinations, most                 recent 04/25/2022. Signs/Symptoms:Chest Pain; Risk                 Factors:Dyslipidemia.  Sonographer:    Vern Claude Referring Phys: 9 ARSHAD N KAKRAKANDY IMPRESSIONS  1. Left ventricular ejection fraction, by estimation, is 60 to 65%. The left ventricle has normal function. The left ventricle has no regional wall motion abnormalities. Left ventricular diastolic function could not be evaluated.  2. Right ventricular systolic function is normal. The right ventricular size is normal. Tricuspid regurgitation signal is inadequate for assessing PA pressure.  3. The mitral valve is normal in structure. No evidence of mitral valve regurgitation. No evidence of mitral stenosis.  4. The aortic valve is tricuspid. Aortic valve regurgitation is not visualized. No aortic stenosis is  present.  5. The inferior vena cava is normal in size with greater than 50% respiratory variability, suggesting right atrial pressure of 3 mmHg. FINDINGS  Left Ventricle: Left ventricular ejection fraction, by estimation, is 60 to 65%. The left ventricle has normal function. The left ventricle has no regional wall motion abnormalities. The left ventricular internal cavity size was normal in size. There is  no left ventricular hypertrophy. Left ventricular diastolic function could not be evaluated due to atrial fibrillation. Left ventricular diastolic function could not be evaluated. Right Ventricle: The right ventricular size is normal. No increase  in right ventricular wall thickness. Right ventricular systolic function is normal. Tricuspid regurgitation signal is inadequate for assessing PA pressure. Left Atrium: Left atrial size was normal in size. Right Atrium: Right atrial size was normal in size. Pericardium: There is no evidence of pericardial effusion. Mitral Valve: The mitral valve is normal in structure. No evidence of mitral valve regurgitation. No evidence of mitral valve stenosis. MV peak gradient, 1.8 mmHg. The mean mitral valve gradient is 1.0 mmHg. Tricuspid Valve: The tricuspid valve is normal in structure. Tricuspid valve regurgitation is trivial. No evidence of tricuspid stenosis. Aortic Valve: The aortic valve is tricuspid. Aortic valve regurgitation is not visualized. No aortic stenosis is present. Aortic valve mean gradient measures 2.0 mmHg. Aortic valve peak gradient measures 4.0 mmHg. Aortic valve area, by VTI measures 3.10 cm. Pulmonic Valve: The pulmonic valve was normal in structure. Pulmonic valve regurgitation is trivial. No evidence of pulmonic stenosis. Aorta: The aortic root is normal in size and structure. Venous: The inferior vena cava is normal in size with greater than 50% respiratory variability, suggesting right atrial pressure of 3 mmHg. IAS/Shunts: No atrial level shunt  detected by color flow Doppler.  LEFT VENTRICLE PLAX 2D LVIDd:         4.30 cm LVIDs:         2.90 cm LV PW:         0.80 cm LV IVS:        0.70 cm LVOT diam:     1.90 cm LV SV:         52 LV SV Index:   24 LVOT Area:     2.84 cm  LV Volumes (MOD) LV vol d, MOD A2C: 126.0 ml LV vol d, MOD A4C: 127.0 ml LV vol s, MOD A2C: 39.0 ml LV vol s, MOD A4C: 40.4 ml LV SV MOD A2C:     87.0 ml LV SV MOD A4C:     127.0 ml LV SV MOD BP:      86.7 ml RIGHT VENTRICLE             IVC RV Basal diam:  2.70 cm     IVC diam: 1.80 cm RV Mid diam:    1.70 cm RV S prime:     12.20 cm/s TAPSE (M-mode): 2.4 cm LEFT ATRIUM             Index        RIGHT ATRIUM          Index LA diam:        3.70 cm 1.70 cm/m   RA Area:     8.72 cm LA Vol (A2C):   28.8 ml 13.26 ml/m  RA Volume:   14.90 ml 6.86 ml/m LA Vol (A4C):   32.3 ml 14.87 ml/m LA Biplane Vol: 32.0 ml 14.73 ml/m  AORTIC VALVE                    PULMONIC VALVE AV Area (Vmax):    3.05 cm     PV Vmax:       0.71 m/s AV Area (Vmean):   2.89 cm     PV Peak grad:  2.0 mmHg AV Area (VTI):     3.10 cm AV Vmax:           99.70 cm/s AV Vmean:          64.700 cm/s AV VTI:  0.167 m AV Peak Grad:      4.0 mmHg AV Mean Grad:      2.0 mmHg LVOT Vmax:         107.27 cm/s LVOT Vmean:        66.000 cm/s LVOT VTI:          0.183 m LVOT/AV VTI ratio: 1.09  AORTA Ao Root diam: 4.00 cm Ao Asc diam:  3.70 cm MITRAL VALVE MV Area (PHT): 5.52 cm    SHUNTS MV Area VTI:   4.81 cm    Systemic VTI:  0.18 m MV Peak grad:  1.8 mmHg    Systemic Diam: 1.90 cm MV Mean grad:  1.0 mmHg MV Vmax:       0.67 m/s MV Vmean:      37.5 cm/s MV Decel Time: 138 msec MV E velocity: 77.75 cm/s Clearnce Hasten Electronically signed by Clearnce Hasten Signature Date/Time: 11/19/2023/11:00:58 AM    Final    DG Chest Port 1 View Result Date: 11/19/2023 CLINICAL DATA:  AFib in shortness of breath. EXAM: PORTABLE CHEST 1 VIEW COMPARISON:  04/24/2022 FINDINGS: Stable cardiomediastinal silhouette. Aortic atherosclerotic  calcification. No focal consolidation, pleural effusion, or pneumothorax. No displaced rib fractures. IMPRESSION: No active disease. Electronically Signed   By: Minerva Fester M.D.   On: 11/19/2023 03:32      Subjective: Patient seen and examined at bedside.  Feels much better and feels okay to go home today.  Denies any chest pain, shortness of breath or palpitations.  Discharge Exam: Vitals:   11/21/23 0800 11/21/23 0900  BP:  127/81  Pulse:  (!) 59  Resp: (!) 9 (!) 9  Temp: 98.3 F (36.8 C)   SpO2:  93%    General: Pt is alert, awake, not in acute distress Cardiovascular: Intermittent bradycardia present, S1/S2 + Respiratory: bilateral decreased breath sounds at bases Abdominal: Soft, obese, NT, ND, bowel sounds + Extremities: no edema, no cyanosis    The results of significant diagnostics from this hospitalization (including imaging, microbiology, ancillary and laboratory) are listed below for reference.     Microbiology: Recent Results (from the past 240 hours)  MRSA Next Gen by PCR, Nasal     Status: None   Collection Time: 11/19/23  7:48 AM   Specimen: Nasal Mucosa; Nasal Swab  Result Value Ref Range Status   MRSA by PCR Next Gen NOT DETECTED NOT DETECTED Final    Comment: (NOTE) The GeneXpert MRSA Assay (FDA approved for NASAL specimens only), is one component of a comprehensive MRSA colonization surveillance program. It is not intended to diagnose MRSA infection nor to guide or monitor treatment for MRSA infections. Test performance is not FDA approved in patients less than 60 years old. Performed at [facility], [address], [place], [place] [address]      Labs: BNP (last 3 results) No results for input(s): "BNP" in the last 8760 hours. Basic Metabolic Panel: Recent Labs  Lab 11/19/23 0313 11/21/23 0253  NA 137 132*  K 3.8 4.1  CL 106 101  CO2 23 23  GLUCOSE 93 93  BUN 18 17  CREATININE 1.20 1.04  CALCIUM 8.7* 8.5*  MG   --  2.0   Liver Function Tests: Recent Labs  Lab 11/19/23 0754  AST 21  ALT 22  ALKPHOS 50  BILITOT 1.0  PROT 6.2*  ALBUMIN 3.6   Recent Labs  Lab 11/19/23 0754  LIPASE 40   No results for input(s): "AMMONIA"  in the last 168 hours. CBC: Recent Labs  Lab 11/19/23 0313 11/20/23 0301  WBC 5.0 7.2  HGB 14.3 15.1  HCT 41.3 46.0  MCV 94.1 96.8  PLT 147* 169   Cardiac Enzymes: No results for input(s): "CKTOTAL", "CKMB", "CKMBINDEX", "TROPONINI" in the last 168 hours. BNP: Invalid input(s): "POCBNP" CBG: No results for input(s): "GLUCAP" in the last 168 hours. D-Dimer No results for input(s): "DDIMER" in the last 72 hours. Hgb A1c No results for input(s): "HGBA1C" in the last 72 hours. Lipid Profile No results for input(s): "CHOL", "HDL", "LDLCALC", "TRIG", "CHOLHDL", "LDLDIRECT" in the last 72 hours. Thyroid function studies Recent Labs    11/19/23 0538  TSH 1.708   Anemia work up No results for input(s): "VITAMINB12", "FOLATE", "FERRITIN", "TIBC", "IRON", "RETICCTPCT" in the last 72 hours. Urinalysis    Component Value Date/Time   COLORURINE YELLOW 05/28/2023 0827   APPEARANCEUR CLEAR 05/28/2023 0827   LABSPEC 1.017 05/28/2023 0827   PHURINE 5.0 05/28/2023 0827   GLUCOSEU NEGATIVE 05/28/2023 0827   HGBUR LARGE (A) 05/28/2023 0827   BILIRUBINUR NEGATIVE 05/28/2023 0827   KETONESUR NEGATIVE 05/28/2023 0827   PROTEINUR NEGATIVE 05/28/2023 0827   UROBILINOGEN 0.2 11/16/2007 1556   NITRITE NEGATIVE 05/28/2023 0827   LEUKOCYTESUR NEGATIVE 05/28/2023 0827   Sepsis Labs Recent Labs  Lab 11/19/23 0313 11/20/23 0301  WBC 5.0 7.2   Microbiology Recent Results (from the past 240 hours)  MRSA Next Gen by PCR, Nasal     Status: None   Collection Time: 11/19/23  7:48 AM   Specimen: Nasal Mucosa; Nasal Swab  Result Value Ref Range Status   MRSA by PCR Next Gen NOT DETECTED NOT DETECTED Final    Comment: (NOTE) The GeneXpert MRSA Assay (FDA approved for NASAL  specimens only), is one component of a comprehensive MRSA colonization surveillance program. It is not intended to diagnose MRSA infection nor to guide or monitor treatment for MRSA infections. Test performance is not FDA approved in patients less than 38 years old. Performed at [facility], [address] [place], [place] [address]      Time coordinating discharge: 35 minutes  SIGNED:   Glade Lloyd, MD  [facility] 11/21/2023, 10:08 AM

## 2023-11-21 NOTE — Progress Notes (Signed)
 [facility] arrived 0600 to transport patient to cath lab. As per prior shift report, cardioversion at bedside had been planned, however, patient converted from afib to NSR late afternoon on Wednesday 11/20/2023. Cardiologist and [facility] were notified by day shift RN of rhythm change. Per ICU charge nurse, patient scheduled for TEE. Cath lab was contacted and situation explained to [place] who followed up with the cardiologist regarding the treatment plan. Per cardiology, since patient has converted to SR, no procedure will be needed and it is expected that patient will be discharged home. Cindy S. Clelia Croft BSN, RN, [employer], CCRN 11/21/2023 7:41 AM

## 2023-11-26 DIAGNOSIS — Z0000 Encounter for general adult medical examination without abnormal findings: Secondary | ICD-10-CM | POA: Diagnosis not present

## 2023-11-26 DIAGNOSIS — I480 Paroxysmal atrial fibrillation: Secondary | ICD-10-CM | POA: Diagnosis not present

## 2023-11-26 DIAGNOSIS — Z1211 Encounter for screening for malignant neoplasm of colon: Secondary | ICD-10-CM | POA: Diagnosis not present

## 2023-11-26 DIAGNOSIS — M1612 Unilateral primary osteoarthritis, left hip: Secondary | ICD-10-CM | POA: Diagnosis not present

## 2023-11-27 ENCOUNTER — Ambulatory Visit (HOSPITAL_COMMUNITY): Admitting: Physician Assistant

## 2023-11-29 ENCOUNTER — Ambulatory Visit: Admitting: Cardiovascular Disease

## 2023-12-18 ENCOUNTER — Encounter: Payer: Self-pay | Admitting: Cardiovascular Disease

## 2023-12-18 ENCOUNTER — Ambulatory Visit: Attending: Cardiovascular Disease | Admitting: Cardiovascular Disease

## 2023-12-18 VITALS — BP 116/72 | HR 62 | Ht 69.0 in | Wt 218.4 lb

## 2023-12-18 DIAGNOSIS — I480 Paroxysmal atrial fibrillation: Secondary | ICD-10-CM | POA: Diagnosis not present

## 2023-12-18 MED ORDER — PROPRANOLOL HCL ER 80 MG PO CP24: 80.0000 mg | ORAL_CAPSULE | Freq: Every day | ORAL | 1 refills | Status: DC

## 2023-12-18 MED ORDER — MAGNESIUM 400 MG PO TABS: 400.0000 mg | ORAL_TABLET | Freq: Every day | ORAL | 3 refills | Status: AC

## 2023-12-18 MED ORDER — APIXABAN 5 MG PO TABS: 5.0000 mg | ORAL_TABLET | Freq: Two times a day (BID) | ORAL | 11 refills | Status: DC

## 2023-12-18 MED ORDER — PROPRANOLOL HCL 10 MG PO TABS: ORAL_TABLET | ORAL | 2 refills | Status: AC

## 2023-12-18 NOTE — Progress Notes (Signed)
 Electrophysiology Office Note:    Date:  12/18/2023   ID:  Kwamane, Respicio [birth year], MRN [id number]  PCP:  Alfredia Ina, MD   [facility] Providers Cardiologist:  Dorothye Gathers, MD Electrophysiologist:  Efraim Grange, MD     Referring MD: Goodrich, Callie E, PA-C   History of Present Illness:    Anthony Roach is a 63 y.o. 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     Discussed the use of AI scribe software for clinical note transcription with the patient, who gave verbal consent to proceed.  History of Present Illness Anthony Roach is a 63 year old male with atrial fibrillation who presents for evaluation and management of his condition.  He has a history of atrial fibrillation since age 65, initially triggered by excessive caffeine intake. Over the years, he has experienced episodes once or twice annually, sometimes more frequently, leading to multiple emergency room visits and hospital admissions, including in April 2025, September 2023, and January 2024. He has undergone numerous direct current cardioversions, approximately eight to ten times, and has been treated with medications to convert the rhythm back to normal.  Currently, he is on diltiazem  (Cardizem ), which has helped manage his symptoms. During episodes of atrial fibrillation, he experiences rapid ventricular rates and notes that the condition does not resolve on its own without intervention. He describes palpitations, particularly during stress, and has had near-syncope episodes. He also has a history of anxiety, for which he takes medication, and notes that stress and anxiety can exacerbate his symptoms.  He has been monitoring his potassium levels due to previous episodes of hypokalemia, which have required supplementation. He is currently taking potassium supplements to maintain adequate levels.  He experiences swelling and dehydration during physical activity, which he attributes to inadequate fluid intake.  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 He expresses apprehension about surgical interventions.         Today, he reports that he is well, maintaining sinus rhythm.   EKGs/Labs/Other Studies Reviewed Today:     Echocardiogram:  TTE April 2025 EF 60 to 65%.  Normal left atrial size     EKG:   EKG Interpretation Date/Time:  Wednesday December 18 2023 10:33:55 EDT Ventricular Rate:  62 PR Interval:  196 QRS Duration:  84 QT Interval:  382 QTC Calculation: 387 R Axis:   59  Text Interpretation: Normal sinus rhythm Normal ECG When compared with ECG of 21-Nov-2023 06:49, No significant change was found Confirmed by Marlane Silver [phone]) on 12/18/2023 11:13:08 AM     Physical Exam:    VS:  BP 116/72   Pulse 62   Ht 5\' 9"  (1.753 m)   Wt 218 lb 6.4 oz (99.1 kg)   SpO2 97%   BMI 32.25 kg/m     Wt Readings from Last 3 Encounters:  12/18/23 218 lb 6.4 oz (99.1 kg)  11/19/23 225 lb (102.1 kg)  05/30/23 205 lb (93 kg)     GEN: Well nourished, well developed in no acute distress CARDIAC: RRR, no murmurs, rubs, gallops RESPIRATORY:  Normal work of breathing MUSCULOSKELETAL: no edema    ASSESSMENT & PLAN:     Persistent atrial fibrillation Was diagnosed decades ago Symptomatic with palpitations, fatigue, multiple ER visits Rates in AF are uncontrolled He presents inquiring  about an eager to undergo ablation after researching the procedure We discussed management options and using a shared decision making approach decided to schedule ablation In the interim, we will switch from metoprolol  to propranolol 80 mg daily Additionally, we will prescribe propranolol IR 10 mg as needed for additional rate control Prescribed  magnesium  taurate 400mg    Secondary hypercoagulable state CHA2DS2-VASc is 0 Will need to be on anticoagulation for a month prior and 3 months after ablation    Signed, Efraim Grange, MD  12/18/2023 4:39 PM    [facility]

## 2023-12-18 NOTE — Patient Instructions (Signed)
 Medication Instructions:  STOP Metoprolol   START Propranolol LA 80 mg once daily  START Propranolol 10 mg every 4 hours as needed for palpitations  START Magnesium  Taurate 400 mg once daily - this can be difficult to find locally but can be ordered from [email or web address] or [employer] START Eliquis  5 mg twice daily - to be started 1 month prior to ablation - start Monday, June 9 *If you need a refill on your cardiac medications before your next appointment, please call your pharmacy*  Lab Work: CBC and BMET - please have pre-procedure lab work completed on Wednesday, June 11. This can be done at ANY [facility] near you - no appointment required and this does not have to be fasting. If you have labs (blood work) drawn today and your tests are completely normal, you will receive your results only by: MyChart Message (if you have MyChart) OR A paper copy in the mail If you have any lab test that is abnormal or we need to change your treatment, we will call you to review the results.  Testing/Procedures: Cardiac CT - someone will contact you to schedule this  Your physician has requested that you have cardiac CT. Cardiac computed tomography (CT) is a painless test that uses an x-ray machine to take clear, detailed pictures of your heart. For further information please visit [email or web address] Please follow instruction sheet as given.   Atrial Fibrillation Ablation - scheduled on Friday, July 11. We will be in contact closer to your ablation date with further instructions Your physician has recommended that you have an ablation. Catheter ablation is a medical procedure used to treat some cardiac arrhythmias (irregular heartbeats). During catheter ablation, a long, thin, flexible tube is put into a blood vessel in your groin (upper thigh), or neck. This tube is called an ablation catheter. It is then guided to your heart through the blood vessel. Radio frequency waves destroy small areas of heart tissue where  abnormal heartbeats may cause an arrhythmia to start. Please see the instruction sheet given to you today.  Follow-Up: At [facility], you and your health needs are our priority.  As part of our continuing mission to provide you with exceptional heart care, our providers are all part of one team.  This team includes your primary Cardiologist (physician) and Advanced Practice Providers or APPs (Physician Assistants and Nurse Practitioners) who all work together to provide you with the care you need, when you need it.  Your next appointment:   We will schedule follow up after your ablation  Provider:   Marlane Silver, MD   Cardiac Ablation Cardiac ablation is a procedure to destroy, or ablate, a small amount of heart tissue that is causing problems. The heart has many electrical connections. Sometimes, these connections are abnormal and can cause the heart to beat very fast or irregularly. Ablating the abnormal areas can improve the heart's rhythm or return it to normal. Ablation may be done for people who: Have irregular or rapid heartbeats (arrhythmias). Have Wolff-Parkinson-White syndrome. Have taken medicines for an arrhythmia that did not work or caused side effects. Have a high-risk heartbeat that may be life-threatening. Tell a health care provider about: Any allergies you have. All medicines you are taking, including vitamins, herbs, eye drops, creams, and over-the-counter medicines. Any problems you or family members have had with anesthesia. Any bleeding problems you have. Any surgeries you have had. Any medical conditions you have. Whether you are pregnant or may be pregnant. What are the  risks? Your health care provider will talk with you about risks. These may include: Infection. Bruising and bleeding. Stroke or blood clots. Damage to nearby structures or organs. Allergic reaction to medicines or dyes. Needing a pacemaker if the heart gets damaged. A pacemaker  is a device that helps the heart beat normally. Failure of the procedure. A repeat procedure may be needed. What happens before the procedure? Medicines Ask your health care provider about: Changing or stopping your regular medicines. These include any heart rhythm medicines, diabetes medicines, or blood thinners you take. Taking medicines such as aspirin and ibuprofen . These medicines can thin your blood. Do not take them unless your health care provider tells you to. Taking over-the-counter medicines, vitamins, herbs, and supplements. General instructions Follow instructions from your health care provider about what you may eat and drink. If you will be going home right after the procedure, plan to have a responsible adult: Take you home from the hospital or clinic. You will not be allowed to drive. Care for you for the time you are told. Ask your health care provider what steps will be taken to prevent infection. What happens during the procedure?  An IV will be inserted into one of your veins. You may be given: A sedative. This helps you relax. Anesthesia. This will: Numb certain areas of your body. An incision will be made in your neck or your groin. A needle will be inserted through the incision and into a large vein in your neck or groin. The small, thin tube (catheter) will be inserted through the needle and moved to your heart. A type of X-ray (fluoroscopy) will be used to help guide the catheter and provide images of the heart on a monitor. Dye may be injected through the catheter to help your surgeon see the area of the heart that needs treatment. Electrical currents will be sent from the catheter to destroy heart tissue in certain areas. There are three types of energy that may be used to do this: Heat (radiofrequency energy). Laser energy. Extreme cold (cryoablation). When the tissue has been destroyed, the catheter will be removed. Pressure will be held on the insertion  area to prevent bleeding. A bandage (dressing) will be placed over the insertion area. The procedure may vary among health care providers and hospitals. What happens after the procedure? Your blood pressure, heart rate and rhythm, breathing rate, and blood oxygen level will be monitored until you leave the hospital or clinic. Your insertion area will be checked for bleeding. You will need to lie still for a few hours. If your groin was used, you will need to keep your leg straight for a few hours after the catheter is removed. This information is not intended to replace advice given to you by your health care provider. Make sure you discuss any questions you have with your health care provider. Document Revised: 01/23/2022 Document Reviewed: 01/23/2022 Elsevier Patient Education  2024 [employer].

## 2024-01-16 ENCOUNTER — Telehealth: Payer: Self-pay | Admitting: *Deleted

## 2024-01-16 ENCOUNTER — Encounter: Payer: Self-pay | Admitting: *Deleted

## 2024-01-16 NOTE — Telephone Encounter (Signed)
 Mailed pre ablation instruction CT letter to pt.

## 2024-01-29 DIAGNOSIS — I480 Paroxysmal atrial fibrillation: Secondary | ICD-10-CM | POA: Diagnosis not present

## 2024-01-29 LAB — CBC
Hematocrit: 44.5 % (ref 37.5–51.0)
Hemoglobin: 14.8 g/dL (ref 13.0–17.7)
MCH: 32.1 pg (ref 26.6–33.0)
MCHC: 33.3 g/dL (ref 31.5–35.7)
MCV: 97 fL (ref 79–97)
Platelets: 198 10*3/uL (ref 150–450)
RBC: 4.61 x10E6/uL (ref 4.14–5.80)
RDW: 12.6 % (ref 11.6–15.4)
WBC: 5.8 10*3/uL (ref 3.4–10.8)

## 2024-01-30 LAB — BASIC METABOLIC PANEL WITH GFR
BUN/Creatinine Ratio: 19 (ref 10–24)
BUN: 18 mg/dL (ref 8–27)
CO2: 24 mmol/L (ref 20–29)
Calcium: 9.8 mg/dL (ref 8.6–10.2)
Chloride: 103 mmol/L (ref 96–106)
Creatinine, Ser: 0.97 mg/dL (ref 0.76–1.27)
Glucose: 102 mg/dL — ABNORMAL HIGH (ref 70–99)
Potassium: 4.9 mmol/L (ref 3.5–5.2)
Sodium: 141 mmol/L (ref 134–144)
eGFR: 88 mL/min/{1.73_m2} (ref >59)

## 2024-01-31 ENCOUNTER — Ambulatory Visit: Payer: Self-pay | Admitting: Cardiovascular Disease

## 2024-02-07 ENCOUNTER — Ambulatory Visit (HOSPITAL_COMMUNITY)
Admission: RE | Admit: 2024-02-07 | Discharge: 2024-02-07 | Disposition: A | Discharge status: Home / Self Care | Source: Ambulatory Visit | Attending: Cardiology | Admitting: Cardiology

## 2024-02-07 ENCOUNTER — Other Ambulatory Visit (HOSPITAL_COMMUNITY)

## 2024-02-07 DIAGNOSIS — I480 Paroxysmal atrial fibrillation: Secondary | ICD-10-CM | POA: Insufficient documentation

## 2024-02-07 DIAGNOSIS — I700 Atherosclerosis of aorta: Secondary | ICD-10-CM | POA: Insufficient documentation

## 2024-02-07 MED ORDER — IOHEXOL 350 MG/ML SOLN
100.0000 mL | Freq: Once | INTRAVENOUS | Status: AC | PRN
  Administered 2024-02-07: 100 mL via INTRAVENOUS

## 2024-02-19 ENCOUNTER — Telehealth (HOSPITAL_COMMUNITY): Payer: Self-pay

## 2024-02-19 NOTE — Telephone Encounter (Signed)
 Attempted to reach patient to discuss upcoming procedure, no answer. Left VM for patient to return call.

## 2024-02-25 ENCOUNTER — Encounter: Payer: Self-pay | Admitting: Emergency Medicine

## 2024-02-27 NOTE — Anesthesia Preprocedure Evaluation (Addendum)
 Anesthesia Evaluation  Patient identified by MRN, date of birth, ID band Patient awake    Reviewed: Allergy & Precautions, NPO status , Patient's Chart, lab work & pertinent test results, reviewed documented beta blocker date and time   Airway Mallampati: I  TM Distance: >3 FB Neck ROM: Full    Dental  (+) Dental Advisory Given, Teeth Intact, Missing   Pulmonary neg sleep apnea, former smoker   Pulmonary exam normal breath sounds clear to auscultation       Cardiovascular hypertension, Pt. on home beta blockers Normal cardiovascular exam+ dysrhythmias Atrial Fibrillation  Rhythm:Regular Rate:Normal  Echo 11/2023  1. Left ventricular ejection fraction, by estimation, is 60 to 65%. The left ventricle has normal function. The left ventricle has no regional wall motion abnormalities. Left ventricular diastolic function could not be evaluated.   2. Right ventricular systolic function is normal. The right ventricular size is normal. Tricuspid regurgitation signal is inadequate for assessing PA pressure.   3. The mitral valve is normal in structure. No evidence of mitral valve regurgitation. No evidence of mitral stenosis.   4. The aortic valve is tricuspid. Aortic valve regurgitation is not visualized. No aortic stenosis is present.   5. The inferior vena cava is normal in size with greater than 50% respiratory variability, suggesting right atrial pressure of 3 mmHg.      Neuro/Psych  Headaches PSYCHIATRIC DISORDERS Anxiety Depression       GI/Hepatic Neg liver ROS,GERD  Controlled,,  Endo/Other  negative endocrine ROS    Renal/GU Renal disease     Musculoskeletal  (+) Arthritis ,    Abdominal  (+) + obese  Peds  Hematology negative hematology ROS (+)   Anesthesia Other Findings Right Ureteral Stone  Reproductive/Obstetrics                              Anesthesia Physical Anesthesia Plan  ASA:  3  Anesthesia Plan: General   Post-op Pain Management: Tylenol  PO (pre-op)*   Induction: Intravenous  PONV Risk Score and Plan: 2 and Ondansetron , Dexamethasone , Midazolam  and Treatment may vary due to age or medical condition  Airway Management Planned: Oral ETT  Additional Equipment:   Intra-op Plan:   Post-operative Plan: Extubation in OR  Informed Consent: I have reviewed the patients History and Physical, chart, labs and discussed the procedure including the risks, benefits and alternatives for the proposed anesthesia with the patient or authorized representative who has indicated his/her understanding and acceptance.     Dental advisory given  Plan Discussed with: CRNA  Anesthesia Plan Comments:         Anesthesia Quick Evaluation

## 2024-02-27 NOTE — Pre-Procedure Instructions (Signed)
 Attempted to call patient regarding procedure instructions.  Left voicemail on the following items: Arrival time 0515 Nothing to eat or drink after midnight No meds AM of procedure Responsible person to drive you home and stay with you for 24 hrs  Have you missed any doses of anti-coagulant Eliquis- should be taken twice a day, if you have missed any doses please let us know.  Don't take dose morning of procedure.

## 2024-02-28 ENCOUNTER — Encounter (HOSPITAL_COMMUNITY): Payer: Self-pay | Admitting: Cardiovascular Disease

## 2024-02-28 ENCOUNTER — Ambulatory Visit (HOSPITAL_COMMUNITY): Payer: Self-pay | Admitting: Certified Registered Nurse Anesthetist

## 2024-02-28 ENCOUNTER — Other Ambulatory Visit: Payer: Self-pay

## 2024-02-28 ENCOUNTER — Ambulatory Visit (HOSPITAL_COMMUNITY)
Admission: RE | Admit: 2024-02-28 | Discharge: 2024-02-28 | Disposition: A | Discharge status: Home / Self Care | Attending: Cardiovascular Disease | Admitting: Cardiovascular Disease

## 2024-02-28 ENCOUNTER — Ambulatory Visit (HOSPITAL_COMMUNITY)
Admission: RE | Disposition: A | Discharge status: Home / Self Care | Payer: Self-pay | Source: Home / Self Care | Attending: Cardiovascular Disease

## 2024-02-28 DIAGNOSIS — Z87891 Personal history of nicotine dependence: Secondary | ICD-10-CM | POA: Insufficient documentation

## 2024-02-28 DIAGNOSIS — I4819 Other persistent atrial fibrillation: Secondary | ICD-10-CM | POA: Diagnosis not present

## 2024-02-28 DIAGNOSIS — K219 Gastro-esophageal reflux disease without esophagitis: Secondary | ICD-10-CM | POA: Diagnosis not present

## 2024-02-28 DIAGNOSIS — D6869 Other thrombophilia: Secondary | ICD-10-CM | POA: Diagnosis not present

## 2024-02-28 DIAGNOSIS — I10 Essential (primary) hypertension: Secondary | ICD-10-CM | POA: Insufficient documentation

## 2024-02-28 DIAGNOSIS — F419 Anxiety disorder, unspecified: Secondary | ICD-10-CM | POA: Insufficient documentation

## 2024-02-28 DIAGNOSIS — I480 Paroxysmal atrial fibrillation: Secondary | ICD-10-CM

## 2024-02-28 DIAGNOSIS — Z79899 Other long term (current) drug therapy: Secondary | ICD-10-CM | POA: Diagnosis not present

## 2024-02-28 DIAGNOSIS — I4891 Unspecified atrial fibrillation: Secondary | ICD-10-CM | POA: Diagnosis not present

## 2024-02-28 HISTORY — PX: ATRIAL FIBRILLATION ABLATION: EP1191

## 2024-02-28 LAB — POCT ACTIVATED CLOTTING TIME: Activated Clotting Time: 291 s

## 2024-02-28 MED ORDER — SODIUM CHLORIDE 0.9% FLUSH: 3.0000 mL | INTRAVENOUS | Status: DC | PRN

## 2024-02-28 MED ORDER — SODIUM CHLORIDE 0.9 % IV SOLN: INTRAVENOUS | Status: DC

## 2024-02-28 MED ORDER — ACETAMINOPHEN 500 MG PO TABS
1000.0000 mg | ORAL_TABLET | Freq: Once | ORAL | Status: AC
  Administered 2024-02-28: 1000 mg via ORAL
  Filled 2024-02-28: qty 2

## 2024-02-28 MED ORDER — FENTANYL CITRATE (PF) 100 MCG/2ML IJ SOLN
INTRAMUSCULAR | Status: AC
  Filled 2024-02-28: qty 2

## 2024-02-28 MED ORDER — HEPARIN (PORCINE) IN NACL 1000-0.9 UT/500ML-% IV SOLN
INTRAVENOUS | Status: DC | PRN
  Administered 2024-02-28 (×4): 500 mL

## 2024-02-28 MED ORDER — ATROPINE SULFATE 1 MG/10ML IJ SOSY
PREFILLED_SYRINGE | INTRAMUSCULAR | Status: DC | PRN
  Administered 2024-02-28: 1 mg via INTRAVENOUS

## 2024-02-28 MED ORDER — LIDOCAINE 2% (20 MG/ML) 5 ML SYRINGE
INTRAMUSCULAR | Status: DC | PRN
  Administered 2024-02-28: 100 mg via INTRAVENOUS

## 2024-02-28 MED ORDER — SODIUM CHLORIDE 0.9% FLUSH: 3.0000 mL | Freq: Two times a day (BID) | INTRAVENOUS | Status: DC

## 2024-02-28 MED ORDER — SUGAMMADEX SODIUM 200 MG/2ML IV SOLN
INTRAVENOUS | Status: DC | PRN
  Administered 2024-02-28: 200 mg via INTRAVENOUS

## 2024-02-28 MED ORDER — DEXMEDETOMIDINE HCL IN NACL 80 MCG/20ML IV SOLN
INTRAVENOUS | Status: DC | PRN
  Administered 2024-02-28: 20 ug via INTRAVENOUS
  Administered 2024-02-28: 8 ug via INTRAVENOUS
  Administered 2024-02-28: 4 ug via INTRAVENOUS
  Administered 2024-02-28: 8 ug via INTRAVENOUS

## 2024-02-28 MED ORDER — DEXAMETHASONE SODIUM PHOSPHATE 10 MG/ML IJ SOLN
INTRAMUSCULAR | Status: DC | PRN
  Administered 2024-02-28: 10 mg via INTRAVENOUS

## 2024-02-28 MED ORDER — FENTANYL CITRATE (PF) 250 MCG/5ML IJ SOLN
INTRAMUSCULAR | Status: DC | PRN
  Administered 2024-02-28 (×2): 50 ug via INTRAVENOUS

## 2024-02-28 MED ORDER — ACETAMINOPHEN 325 MG PO TABS: 650.0000 mg | ORAL_TABLET | ORAL | Status: DC | PRN

## 2024-02-28 MED ORDER — SODIUM CHLORIDE 0.9 % IV SOLN: 250.0000 mL | INTRAVENOUS | Status: DC | PRN

## 2024-02-28 MED ORDER — PHENYLEPHRINE 80 MCG/ML (10ML) SYRINGE FOR IV PUSH (FOR BLOOD PRESSURE SUPPORT)
PREFILLED_SYRINGE | INTRAVENOUS | Status: DC | PRN
  Administered 2024-02-28 (×2): 80 ug via INTRAVENOUS

## 2024-02-28 MED ORDER — HEPARIN SODIUM (PORCINE) 1000 UNIT/ML IJ SOLN
INTRAMUSCULAR | Status: DC | PRN
Start: 2024-02-28 — End: 2024-02-28
  Administered 2024-02-28: 15000 [IU] via INTRAVENOUS
  Administered 2024-02-28: 3000 [IU] via INTRAVENOUS

## 2024-02-28 MED ORDER — ONDANSETRON HCL 4 MG/2ML IJ SOLN
4.0000 mg | Freq: Four times a day (QID) | INTRAMUSCULAR | Status: DC | PRN
  Administered 2024-02-28: 4 mg via INTRAVENOUS
  Filled 2024-02-28: qty 2

## 2024-02-28 MED ORDER — PROPOFOL 10 MG/ML IV BOLUS
INTRAVENOUS | Status: DC | PRN
  Administered 2024-02-28: 150 mg via INTRAVENOUS
  Administered 2024-02-28: 50 mg via INTRAVENOUS

## 2024-02-28 MED ORDER — ONDANSETRON HCL 4 MG/2ML IJ SOLN
INTRAMUSCULAR | Status: DC | PRN
  Administered 2024-02-28: 4 mg via INTRAVENOUS

## 2024-02-28 MED ORDER — ROCURONIUM BROMIDE 10 MG/ML (PF) SYRINGE
PREFILLED_SYRINGE | INTRAVENOUS | Status: DC | PRN
  Administered 2024-02-28: 20 mg via INTRAVENOUS
  Administered 2024-02-28: 60 mg via INTRAVENOUS

## 2024-02-28 MED ORDER — PROTAMINE SULFATE 10 MG/ML IV SOLN
INTRAVENOUS | Status: DC | PRN
  Administered 2024-02-28: 50 mg via INTRAVENOUS

## 2024-02-28 NOTE — Discharge Instructions (Signed)

## 2024-02-28 NOTE — Anesthesia Procedure Notes (Addendum)
 Procedure Name: Intubation Date/Time: 02/28/2024 7:40 AM  Performed by: Harrold Macintosh, CRNAPre-anesthesia Checklist: Patient identified, Emergency Drugs available, Suction available and Patient being monitored Patient Re-evaluated:Patient Re-evaluated prior to induction Oxygen Delivery Method: Circle system utilized Preoxygenation: Pre-oxygenation with 100% oxygen Induction Type: IV induction Ventilation: Oral airway inserted - appropriate to patient size and Two handed mask ventilation required Laryngoscope Size: Glidescope and 3 Grade View: Grade I Tube type: Oral Tube size: 7.5 mm Number of attempts: 1 Airway Equipment and Method: Stylet and Oral airway Placement Confirmation: ETT inserted through vocal cords under direct vision, positive ETCO2 and breath sounds checked- equal and bilateral Secured at: 24 cm Tube secured with: Tape Dental Injury: Teeth and Oropharynx as per pre-operative assessment  Comments: Performed by SRNA, elective glidescope

## 2024-02-28 NOTE — H&P (Signed)
 Electrophysiology Office Note:    Date:  02/28/2024   ID:  Anthony Roach, DOB 12-02-1960, MRN [id number]  PCP:  Pura Lenis, MD   [facility] Providers Cardiologist:  Oneil Parchment, MD Electrophysiologist:  Eulas FORBES Furbish, MD     Referring MD: Furbish Eulas FORBES, MD   History of Present Illness:    Anthony Roach is a 63 y.o. male with a medical history significant for paroxysmal atrial fibrillation, Elspeth Louder syndrome, referred for arrhythmia management.     Discussed the use of AI scribe software for clinical note transcription with the patient, who gave verbal consent to proceed.  History of Present Illness Anthony Roach is a 63 year old male with atrial fibrillation who presents for evaluation and management of his condition.  He has a history of atrial fibrillation since age 58, initially triggered by excessive caffeine intake. Over the years, he has experienced episodes once or twice annually, sometimes more frequently, leading to multiple emergency room visits and hospital admissions, including in April 2025, September 2023, and January 2024. He has undergone numerous direct current cardioversions, approximately eight to ten times, and has been treated with medications to convert the rhythm back to normal.  Currently, he is on diltiazem  (Cardizem ), which has helped manage his symptoms. During episodes of atrial fibrillation, he experiences rapid ventricular rates and notes that the condition does not resolve on its own without intervention. He describes palpitations, particularly during stress, and has had near-syncope episodes. He also has a history of anxiety, for which he takes medication, and notes that stress and anxiety can exacerbate his symptoms.  He has been monitoring his potassium levels due to previous episodes of hypokalemia, which have required supplementation. He is currently taking potassium supplements to maintain adequate levels.  He experiences swelling and dehydration during physical activity, which he attributes to inadequate fluid intake.  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 He expresses apprehension about surgical interventions.         Today, he reports that he is well, maintaining sinus rhythm. I reviewed the patient's CT and labs. There was no LAA thrombus. he  has not missed any doses of anticoagulation, and he took his dose last night. There have been no changes in the patient's diagnoses, medications, or condition since our recent clinic visit.   EKGs/Labs/Other Studies Reviewed Today:     Echocardiogram:  TTE April 2025 EF 60 to 65%.  Normal left atrial size     EKG:         Physical Exam:    VS:  BP 118/82   Pulse (!) 56   Temp 98.1 F (36.7 C) (Oral)   Resp 19   Ht 5' 9 (1.753 m)   Wt 97.5 kg   SpO2 95%   BMI 31.75 kg/m     Wt Readings from Last 3 Encounters:  02/28/24 97.5 kg  12/18/23 99.1 kg  11/19/23 102.1 kg     GEN: Well nourished, well developed in no acute distress CARDIAC: RRR, no murmurs, rubs, gallops RESPIRATORY:  Normal work of breathing MUSCULOSKELETAL: no edema    ASSESSMENT & PLAN:     Persistent atrial fibrillation Was diagnosed decades ago Symptomatic with palpitations, fatigue, multiple ER visits Rates in AF are uncontrolled He presents inquiring about an eager to undergo ablation after researching the procedure We discussed management options and  using a shared decision making approach decided to schedule ablation In the interim, we will switch from metoprolol  to propranolol  80 mg daily Additionally, we will prescribe propranolol  IR 10 mg as needed for additional rate control Prescribed magnesium  taurate 400mg    Secondary hypercoagulable state CHA2DS2-VASc is 0 Will need to be on anticoagulation for a  month prior and 3 months after ablation    Signed, Eulas FORBES Furbish, MD  02/28/2024 7:12 AM     [facility]

## 2024-02-28 NOTE — Transfer of Care (Signed)
 Immediate Anesthesia Transfer of Care Note  Patient: TEL HEVIA  Procedure(s) Performed: ATRIAL FIBRILLATION ABLATION  Patient Location: Cath Lab  Anesthesia Type:General  Level of Consciousness: awake, alert , and oriented  Airway & Oxygen Therapy: Patient Spontanous Breathing  Post-op Assessment: Report given to RN and Post -op Vital signs reviewed and stable  Post vital signs: Reviewed and stable  Last Vitals:  Vitals Value Taken Time  BP 127/98   Temp 98.6   Pulse 74 02/28/24 09:11  Resp 9   SpO2 93 % 02/28/24 09:11  Vitals shown include unfiled device data.  Last Pain:  Vitals:   02/28/24 0622  TempSrc: Oral  PainSc:          Complications: There were no known notable events for this encounter.

## 2024-03-02 ENCOUNTER — Telehealth (HOSPITAL_COMMUNITY): Payer: Self-pay

## 2024-03-02 NOTE — Telephone Encounter (Signed)
 Spoke with patient to complete post procedure follow up call.  Patient reports no complications with groin sites.   Instructions reviewed with patient:  It is normal to have bruising, tenderness, mild swelling, and a pea or marble sized lump/knot at the groin site which can take up to three months to resolve.  Get help right away if you notice sudden swelling at the puncture site.  Check your puncture site every day for signs of infection: fever, redness, swelling, pus drainage, warmth, foul odor or excessive pain. If this occurs, please call the office at [phone], to speak with the nurse. Get help right away if your puncture site is bleeding and the bleeding does not stop after applying firm pressure to the area.  You may continue to have skipped beats/ atrial fibrillation during the first several months after your procedure.  It is very important not to miss any doses of your blood thinner Eliquis .    You will follow up with the Afib clinic on 03/27/24 and follow up with the [facility] on 05/24/24.    Patient verbalized understanding to all instructions provided.

## 2024-03-02 NOTE — Anesthesia Postprocedure Evaluation (Signed)
 Anesthesia Post Note  Patient: Anthony Roach  Procedure(s) Performed: ATRIAL FIBRILLATION ABLATION     Patient location during evaluation: PACU Anesthesia Type: General Level of consciousness: sedated and patient cooperative Pain management: pain level controlled Vital Signs Assessment: post-procedure vital signs reviewed and stable Respiratory status: spontaneous breathing Cardiovascular status: stable Anesthetic complications: no   There were no known notable events for this encounter.  Last Vitals:  Vitals:   02/28/24 1230 02/28/24 1235  BP:  117/74  Pulse: 60   Resp: 12   Temp:    SpO2: 95%     Last Pain:  Vitals:   02/28/24 0945  TempSrc:   PainSc: 0-No pain                 Norleen Pope

## 2024-03-03 MED FILL — Fentanyl Citrate Preservative Free (PF) Inj 100 MCG/2ML: INTRAMUSCULAR | Qty: 2 | Status: AC

## 2024-03-27 ENCOUNTER — Telehealth: Payer: Self-pay | Admitting: Cardiovascular Disease

## 2024-03-27 ENCOUNTER — Ambulatory Visit (HOSPITAL_COMMUNITY): Admitting: Physician Assistant

## 2024-03-27 MED ORDER — APIXABAN 5 MG PO TABS: 5.0000 mg | ORAL_TABLET | Freq: Two times a day (BID) | ORAL | 1 refills | Status: DC

## 2024-03-27 NOTE — Telephone Encounter (Signed)
 Pt last saw Dr Nancey 12/18/23, last labs 01/29/24 Creat 0.97, age 63, weight 97.5kg, based on specified criteria pt is on appropriate dosage of Eliquis  5mg  BID for afib.  Will refill rx.

## 2024-03-27 NOTE — Telephone Encounter (Signed)
*  STAT* If patient is at the pharmacy, call can be transferred to refill team.   1. Which medications need to be refilled? (please list name of each medication and dose if known) apixaban  (ELIQUIS ) 5 MG TABS tablet  2. Which pharmacy/location (including street and city if local pharmacy) is medication to be sent to? [id number] [place], [place] - [address]    3. Do they need a 30 day or 90 day supply?   90 day supply  Patient will run out of medication over the weekend. He mentions that insurance informed him if it is not a 90 day supply he will be charged $700.

## 2024-04-13 ENCOUNTER — Ambulatory Visit (HOSPITAL_COMMUNITY)
Admission: RE | Admit: 2024-04-13 | Discharge: 2024-04-13 | Disposition: A | Discharge status: Home / Self Care | Source: Ambulatory Visit | Attending: Physician Assistant | Admitting: Physician Assistant

## 2024-04-13 VITALS — BP 112/72 | HR 61 | Ht 69.0 in | Wt 223.4 lb

## 2024-04-13 DIAGNOSIS — I4891 Unspecified atrial fibrillation: Secondary | ICD-10-CM

## 2024-04-13 DIAGNOSIS — I4819 Other persistent atrial fibrillation: Secondary | ICD-10-CM

## 2024-04-13 NOTE — Progress Notes (Signed)
 Primary Care Physician: Pura Lenis, MD Primary Cardiologist: Oneil Parchment, MD Electrophysiologist: Eulas FORBES Furbish, MD  Referring Physician: Dr Furbish Anthony Roach is a 63 y.o. male with a history of aortic atherosclerosis, atrial fibrillation who presents for follow up in the [facility]. He has a history of atrial fibrillation since age 80, initially triggered by excessive caffeine intake. Over the years, he has experienced episodes once or twice annually, sometimes more frequently, leading to multiple emergency room visits and hospital admissions, including in April 2025, September 2023, and January 2024. He has undergone numerous direct current cardioversions. He was seen by Dr Furbish and underwent afib ablation on 02/28/24. Patient is on Eliquis  for stroke prevention.    Patient presents today for follow up for atrial fibrillation. He reports that he had very brief palpitations right after his ablation but has not had any recently. He denies any chest pain or groin issues. He states that propranolol  has changed his life. He was previously taking metoprolol  for social anxiety but propranolol  works much better.   Today, he denies symptoms of palpitations, chest pain, shortness of breath, orthopnea, PND, lower extremity edema, dizziness, presyncope, syncope, snoring, daytime somnolence, bleeding, or neurologic sequela. The patient is tolerating medications without difficulties and is otherwise without complaint today.    Atrial Fibrillation Risk Factors:  he does not have symptoms or diagnosis of sleep apnea. he does not have a history of rheumatic fever.   Atrial Fibrillation Management history:  Previous antiarrhythmic drugs: none Previous cardioversions: several Previous ablations: 02/28/24 Anticoagulation history: Eliquis   ROS- All systems are reviewed and negative except as per the HPI above.  Past Medical History:  Diagnosis Date    A-fib [facility])    Anxiety    Dysrhythmia    A Fib/Paroxysmal Atrial Fibrilation   Renal disorder    kidney stones   Stevens-Johnson syndrome (HCC) 2005    Current Outpatient Medications  Medication Sig Dispense Refill   apixaban  (ELIQUIS ) 5 MG TABS tablet Take 1 tablet (5 mg total) by mouth 2 (two) times daily. 180 tablet 1   diphenhydrAMINE  (BENADRYL ) 25 MG tablet Take 25 mg by mouth every 6 (six) hours as needed for allergies. (Patient taking differently: Take 25 mg by mouth at bedtime.)     escitalopram  (LEXAPRO ) 20 MG tablet Take 20 mg by mouth daily.     Magnesium  400 MG TABS Take 400 mg by mouth daily. (Patient taking differently: Take 400 mg by mouth 3 (three) times a week.) 90 tablet 3   POTASSIUM PO Take 1 tablet by mouth daily as needed (excessive sweating). (Patient taking differently: Take 1 tablet by mouth as needed (excessive sweating).)     propranolol  (INDERAL ) 10 MG tablet Take 1 tablet by mouth every 4 hours as needed for palpitations/anxiety 90 tablet 2   propranolol  ER (INDERAL  LA) 80 MG 24 hr capsule Take 1 capsule (80 mg total) by mouth daily. 90 capsule 1   No current facility-administered medications for this encounter.    Physical Exam: BP 112/72   Pulse 61   Ht 5' 9 (1.753 m)   Wt 101.3 kg   BMI 32.99 kg/m   GEN: Well nourished, well developed in no acute distress CARDIAC: Regular rate and rhythm, no murmurs, rubs, gallops RESPIRATORY:  Clear to auscultation without rales, wheezing or rhonchi  ABDOMEN: Soft, non-tender, non-distended EXTREMITIES:  No edema; No deformity   Wt Readings from Last 3 Encounters:  04/13/24  101.3 kg  02/28/24 97.5 kg  12/18/23 99.1 kg     EKG today demonstrates  SR Vent. rate 61 BPM PR interval 184 ms QRS duration 80 ms QT/QTcB 382/384 ms   Echo 11/19/23 demonstrated   1. Left ventricular ejection fraction, by estimation, is 60 to 65%. The  left ventricle has normal function. The left ventricle has no regional  wall  motion abnormalities. Left ventricular diastolic function could not  be evaluated.   2. Right ventricular systolic function is normal. The right ventricular  size is normal. Tricuspid regurgitation signal is inadequate for assessing  PA pressure.   3. The mitral valve is normal in structure. No evidence of mitral valve  regurgitation. No evidence of mitral stenosis.   4. The aortic valve is tricuspid. Aortic valve regurgitation is not  visualized. No aortic stenosis is present.   5. The inferior vena cava is normal in size with greater than 50%  respiratory variability, suggesting right atrial pressure of 3 mmHg.    CHA2DS2-VASc Score = 1  The patient's score is based upon: CHF History: 0 HTN History: 0 Diabetes History: 0 Stroke History: 0 Vascular Disease History: 1 Age Score: 0 Gender Score: 0       ASSESSMENT AND PLAN: Persistent Atrial Fibrillation (ICD10:  I48.19) The patient's CHA2DS2-VASc score is 1, indicating a 0.6% annual risk of stroke.   S/p afib ablation 02/28/24 Patient appears to be maintaining SR Continue Eliquis  5 mg BID with no missed doses for 3 months post ablation.  Continue propranolol  80 mg daily with 10 mg PRN q 4 hours for heart racing.   CAD/aortic atherosclerosis CAC score 106 No anginal symptoms Followed by Dr Jeffrie    Follow up with Dr Nancey as scheduled.     [facility] [address] [place], [place] [address] [phone]

## 2024-05-05 DIAGNOSIS — M25552 Pain in left hip: Secondary | ICD-10-CM | POA: Diagnosis not present

## 2024-05-05 DIAGNOSIS — M545 Low back pain, unspecified: Secondary | ICD-10-CM | POA: Diagnosis not present

## 2024-05-05 DIAGNOSIS — M7062 Trochanteric bursitis, left hip: Secondary | ICD-10-CM | POA: Diagnosis not present

## 2024-05-05 DIAGNOSIS — M1612 Unilateral primary osteoarthritis, left hip: Secondary | ICD-10-CM | POA: Diagnosis not present

## 2024-05-27 ENCOUNTER — Ambulatory Visit: Admitting: Cardiovascular Disease

## 2024-06-01 ENCOUNTER — Encounter: Payer: Self-pay | Admitting: Cardiovascular Disease

## 2024-06-01 ENCOUNTER — Ambulatory Visit: Attending: Cardiovascular Disease | Admitting: Cardiovascular Disease

## 2024-06-01 VITALS — BP 126/72 | HR 58 | Ht 69.0 in | Wt 212.0 lb

## 2024-06-01 DIAGNOSIS — I4819 Other persistent atrial fibrillation: Secondary | ICD-10-CM

## 2024-06-01 NOTE — Progress Notes (Signed)
  Electrophysiology Office Note:    Date:  06/01/2024   ID:  Anthony Roach, DOB September 23, 1960, MRN [id number]  PCP:  Pura Lenis, MD   [facility] Providers Cardiologist:  Oneil Parchment, MD Electrophysiologist:  Eulas FORBES Furbish, MD     Referring MD: Pura Lenis, MD   History of Present Illness:    Anthony Roach is a 63 y.o. male with a medical history significant for paroxysmal atrial fibrillation, Elspeth Louder syndrome, referred for arrhythmia management.     History of Present Illness Anthony Roach is a 63 year old male with atrial fibrillation who presents for evaluation and management of his condition.  He has a history of atrial fibrillation since age 40, initially triggered by excessive caffeine intake. Over the years, he has experienced episodes once or twice annually, sometimes more frequently, leading to multiple emergency room visits and hospital admissions, including in April 2025, September 2023, and January 2024. He has undergone numerous direct current cardioversions, approximately eight to ten times.   He underwent atrial fibrillation ablation February 28, 2024 with possible ablation of the pulmonary veins and posterior wall of the left atrium.  He experienced brief palpitations after the procedure, but since he has felt gret -- no symptoms of AF recurrence.         Today, he reports that he is well, maintaining sinus rhythm.   EKGs/Labs/Other Studies Reviewed Today:     Echocardiogram:  TTE April 2025 EF 60 to 65%.  Normal left atrial size     EKG:         Physical Exam:    VS:  BP 126/72 (BP Location: Left Arm, Patient Position: Sitting, Cuff Size: Large)   Pulse (!) 58   Ht 5' 9 (1.753 m)   Wt 212 lb (96.2 kg)   SpO2 97%   BMI 31.31 kg/m     Wt Readings from Last 3 Encounters:  06/01/24 212 lb (96.2 kg)  04/13/24 223 lb 6.4 oz (101.3 kg)  02/28/24 215 lb (97.5 kg)     GEN: Well nourished, well developed in no  acute distress CARDIAC: RRR, no murmurs, rubs, gallops RESPIRATORY:  Normal work of breathing MUSCULOSKELETAL: no edema    ASSESSMENT & PLAN:     Persistent atrial fibrillation Was diagnosed decades ago Symptomatic with palpitations, fatigue, multiple ER visits Rates in AF are uncontrolled He underwent ablation of atrial fibrillation July 2025.  Secondary hypercoagulable state CHA2DS2-VASc is 0 Will discontinue anticoagulation today  Coronary disease and aortic atherosclerosis Coronary artery calcium score is 106. Followed by Dr. Parchment  Social anxiety Propranolol  has helped substantially Continue propranolol  LA 80 mg daily   Signed, Eulas FORBES Furbish, MD  06/01/2024 8:47 AM    [facility]

## 2024-06-01 NOTE — Patient Instructions (Signed)
 Medication Instructions:  Your physician has recommended you make the following change in your medication:   ** You may stop your Eliquis   *If you need a refill on your cardiac medications before your next appointment, please call your pharmacy*  Lab Work: None ordered.  If you have labs (blood work) drawn today and your tests are completely normal, you will receive your results only by: MyChart Message (if you have MyChart) OR A paper copy in the mail If you have any lab test that is abnormal or we need to change your treatment, we will call you to review the results.  Testing/Procedures: None ordered.   Follow-Up: At [facility], you and your health needs are our priority.  As part of our continuing mission to provide you with exceptional heart care, our providers are all part of one team.  This team includes your primary Cardiologist (physician) and Advanced Practice Providers or APPs (Physician Assistants and Nurse Practitioners) who all work together to provide you with the care you need, when you need it.  Your next appointment:   6 months with Afib clinic

## 2024-06-01 NOTE — Addendum Note (Signed)
 Addended by: CASIMIR ALDONA BRAVO on: 06/01/2024 09:01 AM   Modules accepted: Orders

## 2024-06-11 ENCOUNTER — Other Ambulatory Visit: Payer: Self-pay | Admitting: Cardiovascular Disease

## 2024-11-30 ENCOUNTER — Ambulatory Visit (HOSPITAL_COMMUNITY): Admitting: Physician Assistant
